# Patient Record
Sex: Male | Born: 1960 | Race: White | Hispanic: No | Marital: Single | Smoking: Never smoker
Health system: Southern US, Community
[De-identification: ages and names within clinical notes are randomized; demographics above are authoritative.]

## PROBLEM LIST (undated history)

## (undated) DIAGNOSIS — I219 Acute myocardial infarction, unspecified: Secondary | ICD-10-CM

## (undated) DIAGNOSIS — I2510 Atherosclerotic heart disease of native coronary artery without angina pectoris: Secondary | ICD-10-CM

## (undated) DIAGNOSIS — E7800 Pure hypercholesterolemia, unspecified: Secondary | ICD-10-CM

## (undated) DIAGNOSIS — I10 Essential (primary) hypertension: Secondary | ICD-10-CM

## (undated) DIAGNOSIS — K259 Gastric ulcer, unspecified as acute or chronic, without hemorrhage or perforation: Secondary | ICD-10-CM

## (undated) HISTORY — PX: CORONARY ANGIOPLASTY WITH STENT PLACEMENT: SHX49

## (undated) HISTORY — PX: TONSILLECTOMY: SUR1361

---

## 2017-05-03 ENCOUNTER — Other Ambulatory Visit: Payer: Self-pay

## 2017-05-03 ENCOUNTER — Encounter (HOSPITAL_BASED_OUTPATIENT_CLINIC_OR_DEPARTMENT_OTHER): Payer: Self-pay

## 2017-05-03 ENCOUNTER — Emergency Department (HOSPITAL_BASED_OUTPATIENT_CLINIC_OR_DEPARTMENT_OTHER)
Admission: EM | Admit: 2017-05-03 | Discharge: 2017-05-03 | Disposition: A | Discharge status: Home / Self Care | Payer: Self-pay | Attending: Emergency Medicine | Admitting: Emergency Medicine

## 2017-05-03 DIAGNOSIS — R112 Nausea with vomiting, unspecified: Secondary | ICD-10-CM | POA: Insufficient documentation

## 2017-05-03 DIAGNOSIS — K279 Peptic ulcer, site unspecified, unspecified as acute or chronic, without hemorrhage or perforation: Secondary | ICD-10-CM | POA: Insufficient documentation

## 2017-05-03 HISTORY — DX: Gastric ulcer, unspecified as acute or chronic, without hemorrhage or perforation: K25.9

## 2017-05-03 LAB — CBC WITH DIFFERENTIAL/PLATELET
BASOS ABS: 0 10*3/uL (ref 0.0–0.1)
Basophils Relative: 0 %
EOS ABS: 0 10*3/uL (ref 0.0–0.7)
EOS PCT: 0 %
HCT: 48.7 % (ref 39.0–52.0)
Hemoglobin: 16.2 g/dL (ref 13.0–17.0)
Lymphocytes Relative: 8 %
Lymphs Abs: 1.2 10*3/uL (ref 0.7–4.0)
MCH: 29.3 pg (ref 26.0–34.0)
MCHC: 33.3 g/dL (ref 30.0–36.0)
MCV: 88.1 fL (ref 78.0–100.0)
Monocytes Absolute: 0.5 10*3/uL (ref 0.1–1.0)
Monocytes Relative: 3 %
Neutro Abs: 13.6 10*3/uL — ABNORMAL HIGH (ref 1.7–7.7)
Neutrophils Relative %: 89 %
PLATELETS: 261 10*3/uL (ref 150–400)
RBC: 5.53 MIL/uL (ref 4.22–5.81)
RDW: 14.1 % (ref 11.5–15.5)
WBC: 15.2 10*3/uL — AB (ref 4.0–10.5)

## 2017-05-03 LAB — COMPREHENSIVE METABOLIC PANEL
ALBUMIN: 4.6 g/dL (ref 3.5–5.0)
ALT: 26 U/L (ref 17–63)
AST: 29 U/L (ref 15–41)
Alkaline Phosphatase: 77 U/L (ref 38–126)
Anion gap: 13 (ref 5–15)
BUN: 20 mg/dL (ref 6–20)
CHLORIDE: 102 mmol/L (ref 101–111)
CO2: 27 mmol/L (ref 22–32)
CREATININE: 0.79 mg/dL (ref 0.61–1.24)
Calcium: 9.3 mg/dL (ref 8.9–10.3)
GFR calc Af Amer: >60 mL/min (ref >60)
GFR calc non Af Amer: >60 mL/min (ref >60)
GLUCOSE: 164 mg/dL — AB (ref 65–99)
Potassium: 4.6 mmol/L (ref 3.5–5.1)
SODIUM: 142 mmol/L (ref 135–145)
Total Bilirubin: 0.8 mg/dL (ref 0.3–1.2)
Total Protein: 7.9 g/dL (ref 6.5–8.1)

## 2017-05-03 LAB — LIPASE, BLOOD: Lipase: 23 U/L (ref 11–51)

## 2017-05-03 MED ORDER — ESOMEPRAZOLE MAGNESIUM 40 MG PO CPDR: DELAYED_RELEASE_CAPSULE | ORAL | 0 refills | Status: DC

## 2017-05-03 MED ORDER — SUCRALFATE 1 G PO TABS: 1.0000 g | ORAL_TABLET | Freq: Three times a day (TID) | ORAL | 0 refills | Status: DC

## 2017-05-03 MED ORDER — SUCRALFATE 1 GM/10ML PO SUSP
ORAL | Status: AC
  Filled 2017-05-03: qty 10

## 2017-05-03 MED ORDER — SUCRALFATE 1 GM/10ML PO SUSP
1.0000 g | Freq: Three times a day (TID) | ORAL | Status: DC
  Administered 2017-05-03: 1 g via ORAL

## 2017-05-03 MED ORDER — ONDANSETRON HCL 4 MG/2ML IJ SOLN
INTRAMUSCULAR | Status: AC
  Filled 2017-05-03: qty 2

## 2017-05-03 MED ORDER — PANTOPRAZOLE SODIUM 40 MG IV SOLR
INTRAVENOUS | Status: AC
  Filled 2017-05-03: qty 40

## 2017-05-03 MED ORDER — PANTOPRAZOLE SODIUM 40 MG IV SOLR
40.0000 mg | Freq: Once | INTRAVENOUS | Status: AC
  Administered 2017-05-03: 40 mg via INTRAVENOUS

## 2017-05-03 MED ORDER — ONDANSETRON HCL 4 MG/2ML IJ SOLN
4.0000 mg | Freq: Once | INTRAMUSCULAR | Status: AC
  Administered 2017-05-03: 4 mg via INTRAVENOUS

## 2017-05-03 NOTE — ED Provider Notes (Signed)
[facility] Provider Note: Lowella Dell, MD, FACEP  CSN: [id number] MRN: [id number] ARRIVAL: 05/03/17 at 2043 ROOM: MH04/[facility]04   CHIEF COMPLAINT  Abdominal Pain   HISTORY OF PRESENT ILLNESS  05/03/17 11:35 PM Joel Hoffman is a 57 y.o. male who complains of sharp epigastric pain that began earlier today.  The onset has been gradual.  He has had several episodes of bilious vomiting.  His pain is less severe now than it was earlier.  He rated his pain as a 7 out of 10 on arrival.  He states his symptoms feel like those of a peptic ulcer 22 years ago.  Pain is not worse with movement or palpation.  He has chronic lower extremity edema, greater on the right than the left.  He does not currently have a physician.   Past Medical History:  Diagnosis Date  . Gastric ulcer     Past Surgical History:  Procedure Laterality Date  . TONSILLECTOMY      No family history on file.  Social History   Tobacco Use  . Smoking status: Never Smoker  . Smokeless tobacco: Never Used  Substance Use Topics  . Alcohol use: No    Frequency: Never  . Drug use: No    Prior to Admission medications   Not on File    Allergies Patient has no known allergies.   REVIEW OF SYSTEMS  Negative except as noted here or in the History of Present Illness.   PHYSICAL EXAMINATION  Initial Vital Signs Blood pressure (!) 181/111, pulse 94, temperature 98.2 F (36.8 C), temperature source Oral, resp. rate (!) 22, height 6' (1.829 m), weight 127 kg (279 lb 15.8 oz), SpO2 96 %.  Examination General: Well-developed, well-nourished male in no acute distress; appearance consistent with age of record HENT: normocephalic; atraumatic Eyes: pupils equal, round and reactive to light; extraocular muscles intact Neck: supple Heart: regular rate and rhythm Lungs: clear to auscultation bilaterally Abdomen: soft; nondistended; nontender; bowel sounds present Extremities: No deformity; full range of  motion; pulses normal; +1 pitting edema of lower legs Neurologic: Awake, alert and oriented; motor function intact in all extremities and symmetric; no facial droop Skin: Warm and dry Psychiatric: Normal mood and affect   RESULTS  Summary of this visit's results, reviewed by myself:   EKG Interpretation  Date/Time:    Ventricular Rate:    PR Interval:    QRS Duration:   QT Interval:    QTC Calculation:   R Axis:     Text Interpretation:        Laboratory Studies: Results for orders placed or performed during the hospital encounter of 05/03/17 (from the past 24 hour(s))  CBC with Differential     Status: Abnormal   Collection Time: 05/03/17 10:59 PM  Result Value Ref Range   WBC 15.2 (H) 4.0 - 10.5 K/uL   RBC 5.53 4.22 - 5.81 MIL/uL   Hemoglobin 16.2 13.0 - 17.0 g/dL   HCT 28.4 13.2 - 44.0 %   MCV 88.1 78.0 - 100.0 fL   MCH 29.3 26.0 - 34.0 pg   MCHC 33.3 30.0 - 36.0 g/dL   RDW 10.2 72.5 - 36.6 %   Platelets 261 150 - 400 K/uL   Neutrophils Relative % 89 %   Neutro Abs 13.6 (H) 1.7 - 7.7 K/uL   Lymphocytes Relative 8 %   Lymphs Abs 1.2 0.7 - 4.0 K/uL   Monocytes Relative 3 %   Monocytes Absolute 0.5  0.1 - 1.0 K/uL   Eosinophils Relative 0 %   Eosinophils Absolute 0.0 0.0 - 0.7 K/uL   Basophils Relative 0 %   Basophils Absolute 0.0 0.0 - 0.1 K/uL  Comprehensive metabolic panel     Status: Abnormal   Collection Time: 05/03/17 10:59 PM  Result Value Ref Range   Sodium 142 135 - 145 mmol/L   Potassium 4.6 3.5 - 5.1 mmol/L   Chloride 102 101 - 111 mmol/L   CO2 27 22 - 32 mmol/L   Glucose, Bld 164 (H) 65 - 99 mg/dL   BUN 20 6 - 20 mg/dL   Creatinine, Ser 4.130.79 0.61 - 1.24 mg/dL   Calcium 9.3 8.9 - 24.410.3 mg/dL   Total Protein 7.9 6.5 - 8.1 g/dL   Albumin 4.6 3.5 - 5.0 g/dL   AST 29 15 - 41 U/L   ALT 26 17 - 63 U/L   Alkaline Phosphatase 77 38 - 126 U/L   Total Bilirubin 0.8 0.3 - 1.2 mg/dL   GFR calc non Af Amer >60 >60 mL/min   GFR calc Af Amer >60 >60 mL/min    Anion gap 13 5 - 15  Lipase, blood     Status: None   Collection Time: 05/03/17 10:59 PM  Result Value Ref Range   Lipase 23 11 - 51 U/L   Imaging Studies: No results found.  ED COURSE  Nursing notes and initial vitals signs, including pulse oximetry, reviewed.  Vitals:   05/03/17 2103 05/03/17 2104 05/03/17 2313  BP: (!) 186/107  (!) 181/111  Pulse: 93  94  Resp: 20  (!) 22  Temp: 98.2 F (36.8 C)    TempSrc: Oral    SpO2: 98%  96%  Weight:  127 kg (279 lb 15.8 oz)   Height:  6' (1.829 m)    11:41 PM Significant relief with oral Carafate and IV Protonix.  We will start him on treatment for peptic ulcer disease and refer to gastroenterology.  PROCEDURES    ED DIAGNOSES     ICD-10-CM   1. Peptic ulcer disease K27.9        Amee Boothe, Jakevion, MD 05/03/17 2344

## 2017-05-03 NOTE — ED Triage Notes (Signed)
C/o upper abd pain, n/v x today-states feels same as when he had stomach ulcer-NAD-steady gait

## 2018-05-14 ENCOUNTER — Other Ambulatory Visit: Payer: Self-pay

## 2018-05-14 ENCOUNTER — Encounter (HOSPITAL_BASED_OUTPATIENT_CLINIC_OR_DEPARTMENT_OTHER): Payer: Self-pay | Admitting: Emergency Medicine

## 2018-05-14 ENCOUNTER — Emergency Department (HOSPITAL_BASED_OUTPATIENT_CLINIC_OR_DEPARTMENT_OTHER): Payer: Self-pay

## 2018-05-14 ENCOUNTER — Emergency Department (HOSPITAL_BASED_OUTPATIENT_CLINIC_OR_DEPARTMENT_OTHER)
Admission: EM | Admit: 2018-05-14 | Discharge: 2018-05-14 | Disposition: A | Discharge status: Home / Self Care | Payer: Self-pay | Attending: Emergency Medicine | Admitting: Emergency Medicine

## 2018-05-14 DIAGNOSIS — R0789 Other chest pain: Secondary | ICD-10-CM | POA: Insufficient documentation

## 2018-05-14 DIAGNOSIS — R079 Chest pain, unspecified: Secondary | ICD-10-CM

## 2018-05-14 DIAGNOSIS — R059 Cough, unspecified: Secondary | ICD-10-CM

## 2018-05-14 DIAGNOSIS — J189 Pneumonia, unspecified organism: Secondary | ICD-10-CM | POA: Insufficient documentation

## 2018-05-14 DIAGNOSIS — Z79899 Other long term (current) drug therapy: Secondary | ICD-10-CM | POA: Insufficient documentation

## 2018-05-14 DIAGNOSIS — R05 Cough: Secondary | ICD-10-CM

## 2018-05-14 MED ORDER — DOXYCYCLINE HYCLATE 100 MG PO CAPS: 100.0000 mg | ORAL_CAPSULE | Freq: Two times a day (BID) | ORAL | 0 refills | Status: AC

## 2018-05-14 MED ORDER — BENZONATATE 100 MG PO CAPS: 100.0000 mg | ORAL_CAPSULE | Freq: Three times a day (TID) | ORAL | 0 refills | Status: DC | PRN

## 2018-05-14 MED ORDER — DOXYCYCLINE HYCLATE 100 MG PO TABS
100.0000 mg | ORAL_TABLET | Freq: Once | ORAL | Status: AC
  Administered 2018-05-14: 100 mg via ORAL
  Filled 2018-05-14: qty 1

## 2018-05-14 NOTE — ED Notes (Signed)
Patient transported to X-ray 

## 2018-05-14 NOTE — ED Triage Notes (Signed)
Cough x 3 weeks. Also reports L rib pain when taking a deep breath.

## 2018-05-14 NOTE — ED Provider Notes (Signed)
[facility] EMERGENCY DEPARTMENT Provider Note   CSN: [id number] Arrival date & time: 05/14/18  0901     History   Chief Complaint Chief Complaint  Patient presents with  . Cough    HPI Joel Hoffman is a 58 y.o. male.  The history is provided by the patient and medical records. No language interpreter was used.  Cough  Cough characteristics:  Non-productive and productive Sputum characteristics:  Nondescript Severity:  Severe Onset quality:  Gradual Duration:  3 weeks Timing:  Constant Progression:  Waxing and waning Chronicity:  New Relieved by:  Nothing Worsened by:  Nothing Associated symptoms: chest pain, chills and fever   Associated symptoms: no diaphoresis, no headaches, no rash, no rhinorrhea, no shortness of breath, no sinus congestion and no wheezing     Past Medical History:  Diagnosis Date  . Gastric ulcer     There are no active problems to display for this patient.   Past Surgical History:  Procedure Laterality Date  . TONSILLECTOMY          Home Medications    Prior to Admission medications   Medication Sig Start Date End Date Taking? Authorizing Provider  esomeprazole (NEXIUM) 40 MG capsule Take 1 capsule every morning at least 30 minutes before first dose of Carafate. 05/03/17   Molpus, Shigeru, MD  sucralfate (CARAFATE) 1 g tablet Take 1 tablet (1 g total) by mouth 4 (four) times daily -  with meals and at bedtime. 05/03/17   Molpus, Ferron, MD    Family History No family history on file.  Social History Social History   Tobacco Use  . Smoking status: Never Smoker  . Smokeless tobacco: Never Used  Substance Use Topics  . Alcohol use: No    Frequency: Never  . Drug use: No     Allergies   Patient has no known allergies.   Review of Systems Review of Systems  Constitutional: Positive for chills and fever. Negative for diaphoresis and fatigue.  HENT: Negative for congestion and rhinorrhea.   Eyes: Negative for visual  disturbance.  Respiratory: Positive for cough. Negative for chest tightness, shortness of breath, wheezing and stridor.   Cardiovascular: Positive for chest pain. Negative for palpitations and leg swelling.  Gastrointestinal: Negative for constipation, diarrhea, nausea and vomiting.  Genitourinary: Negative for flank pain.  Musculoskeletal: Negative for back pain, neck pain and neck stiffness.  Skin: Negative for rash.  Neurological: Negative for light-headedness and headaches.  Psychiatric/Behavioral: Negative for agitation.  All other systems reviewed and are negative.    Physical Exam Updated Vital Signs BP (!) 176/105 (BP Location: Left Arm)   Pulse 91   Temp 98.4 F (36.9 C) (Oral)   Resp 20   Ht 6' (1.829 m)   Wt 124.7 kg   SpO2 99%   BMI 37.30 kg/m   Physical Exam Vitals signs and nursing note reviewed.  Constitutional:      General: He is not in acute distress.    Appearance: He is well-developed. He is not ill-appearing, toxic-appearing or diaphoretic.  HENT:     Head: Normocephalic and atraumatic.     Nose: No congestion or rhinorrhea.     Mouth/Throat:     Pharynx: No oropharyngeal exudate or posterior oropharyngeal erythema.  Eyes:     Conjunctiva/sclera: Conjunctivae normal.     Pupils: Pupils are equal, round, and reactive to light.  Neck:     Musculoskeletal: Neck supple. No neck rigidity or muscular tenderness.  Cardiovascular:     Rate and Rhythm: Normal rate and regular rhythm.     Heart sounds: No murmur.  Pulmonary:     Effort: Pulmonary effort is normal. No respiratory distress.     Breath sounds: Rhonchi present. No wheezing or rales.  Chest:     Chest wall: Tenderness present.  Abdominal:     Palpations: Abdomen is soft.     Tenderness: There is no abdominal tenderness. There is no guarding.  Musculoskeletal:        General: Tenderness present.  Skin:    General: Skin is warm and dry.     Capillary Refill: Capillary refill takes less than  2 seconds.  Neurological:     General: No focal deficit present.     Mental Status: He is alert and oriented to person, place, and time.     Gait: Gait normal.  Psychiatric:        Mood and Affect: Mood normal.      ED Treatments / Results  Labs (all labs ordered are listed, but only abnormal results are displayed) Labs Reviewed - No data to display  EKG EKG Interpretation  Date/Time:  Tuesday May 14 2018 10:03:24 EST Ventricular Rate:  79 PR Interval:    QRS Duration: 92 QT Interval:  366 QTC Calculation: 420 R Axis:   39 Text Interpretation:  Sinus rhythm Nonspecific T abnormalities, inferior leads Baseline wander in lead(s) V2 When compared to prior, new t wave inversions in leads 3 and AVF.  No STEMI Confirmed by Theda Belfast ([id number]) on 05/14/2018 10:09:42 AM   Radiology Dg Chest 2 View  Result Date: 05/14/2018 CLINICAL DATA:  Cough. EXAM: CHEST - 2 VIEW COMPARISON:  No recent prior. FINDINGS: Mediastinum hilar structures normal. Left base atelectasis and infiltrate consistent pneumonia. Mild right base infiltrate can not be excluded. Small left pleural effusion. Elevation left hemidiaphragm. No pneumothorax. No acute bony abnormality. IMPRESSION: 1. Prominent left base atelectasis and infiltrate consistent pneumonia. Small left pleural effusion. Elevation left hemidiaphragm. 2.  Mild right base infiltrate can not be excluded. Electronically Signed   By: Maisie Fus  Register   On: 05/14/2018 09:28    Procedures Procedures (including critical care time)  Medications Ordered in ED Medications  doxycycline (VIBRA-TABS) tablet 100 mg (100 mg Oral Given 05/14/18 1017)     Initial Impression / Assessment and Plan / ED Course  I have reviewed the triage vital signs and the nursing notes.  Pertinent labs & imaging results that were available during my care of the patient were reviewed by me and considered in my medical decision making (see chart for details).     Joel Hoffman is a 58 y.o. male with a past medical history significant for gastric ulcers and prior pneumonia years ago who presents with cough, subjective fevers, chills, and left-sided chest pain.  Patient reports that for the last 3 weeks he has been having this significant cough.  He reports this is intermittently productive.  He has not taken his temperature at home but is had shaking chills at times.  He reports no nausea, vomiting, diaphoresis, urinary symptoms or GI symptoms.  He reports the pain is in his left lateral chest and worsened with deep breathing or coughing.  He thinks it may be a pulled muscle in the setting of his coughing.  He has no history of DVT or PE.  He has no history of cardiac disease.  No recent trauma otherwise.  He describes the pain  is moderate to severe and only with breathing or coughing.  On exam, patient had rhonchi diffusely.  No wheezing or crackles.  No rash to suggest shingles on exam.  Left chest was slightly tender to palpation with movement.  Abdomen nontender, no CVA tenderness.  Legs had no significant edema.  Pulses symmetric and normal.  Clinical aspect patient has pneumonia given his history and exam findings and story.  Chest x-ray confirms pneumonia.  EKG was performed showing some new T wave inversions.  I informed the patient that given his left-sided chest pain, patients can still be having a cardiac injury or pulmonary embolism causing his symptoms and not just pneumonia however patient elected to pursue outpatient antibiotics and PCP follow-up as well as strict return precautions.  Patient was advised that symptoms could worsen however he still wants to go home with antibiotics.  Patient given a dose of doxycycline will be given a prescription for this.  He also be given a prescription for Tessalon given his significant coughing preventing sleeping.  Patient was able to ambulate around the emergency department without significant tachycardia or hypoxia,  I have low suspicion for PE however cannot definitively ruled out as we discussed.  Patient had no other questions or concerns and was able to ambulate without difficulty.  Patient understood return precautions and was discharged in good condition.   Final Clinical Impressions(s) / ED Diagnoses   Final diagnoses:  Cough  Left-sided chest pain  Community acquired pneumonia, unspecified laterality    ED Discharge Orders         Ordered    doxycycline (VIBRAMYCIN) 100 MG capsule  2 times daily     05/14/18 1013    benzonatate (TESSALON) 100 MG capsule  3 times daily PRN     05/14/18 1013         Clinical Impression: 1. Cough   2. Left-sided chest pain   3. Community acquired pneumonia, unspecified laterality     Disposition: Discharge  Condition: Good  I have discussed the results, Dx and Tx plan with the pt(& family if present). He/she/they expressed understanding and agree(s) with the plan. Discharge instructions discussed at great length. Strict return precautions discussed and pt &/or family have verbalized understanding of the instructions. No further questions at time of discharge.    New Prescriptions   BENZONATATE (TESSALON) 100 MG CAPSULE    Take 1 capsule (100 mg total) by mouth 3 (three) times daily as needed for cough.   DOXYCYCLINE (VIBRAMYCIN) 100 MG CAPSULE    Take 1 capsule (100 mg total) by mouth 2 (two) times daily for 7 days.    Follow Up: [facility] [address] [place] [address] [phone] Schedule an appointment as soon as possible for a visit    [facility] [place] [facility]Y DEPARTMENT [address] [id number] [address] [place] [address] [phone]       , Canary Brim, MD 05/14/18 1020

## 2018-05-14 NOTE — ED Notes (Signed)
ED Provider at bedside. 

## 2018-05-14 NOTE — Discharge Instructions (Signed)
Your x-ray today showed evidence of pneumonia likely contributing to your discomfort and symptoms.  Your EKG showed some abnormalities compared to prior and we discussed doing further lab testing and possible imaging however after our shared decision-making conversation, you elected to try outpatient treatment with antibiotics of your pneumonia initially.  Given your reassuring vital signs and oxygen saturations, we felt this was a reasonable decision.  Please follow-up with a primary physician and if any symptoms change or worsen including worsening shortness of breath or chest pain, please return immediately to the nearest emergency department for further work-up and likely further imaging to rule out pulmonary embolism.

## 2018-05-14 NOTE — ED Notes (Signed)
Pt ambulated around the dept, maintained O2 sats at 95%

## 2018-05-24 ENCOUNTER — Other Ambulatory Visit: Payer: Self-pay

## 2018-05-24 ENCOUNTER — Emergency Department (HOSPITAL_BASED_OUTPATIENT_CLINIC_OR_DEPARTMENT_OTHER)
Admission: EM | Admit: 2018-05-24 | Discharge: 2018-05-24 | Disposition: A | Discharge status: Home / Self Care | Payer: Self-pay | Attending: Emergency Medicine | Admitting: Emergency Medicine

## 2018-05-24 ENCOUNTER — Encounter (HOSPITAL_BASED_OUTPATIENT_CLINIC_OR_DEPARTMENT_OTHER): Payer: Self-pay | Admitting: Emergency Medicine

## 2018-05-24 DIAGNOSIS — Z79899 Other long term (current) drug therapy: Secondary | ICD-10-CM | POA: Insufficient documentation

## 2018-05-24 DIAGNOSIS — J189 Pneumonia, unspecified organism: Secondary | ICD-10-CM

## 2018-05-24 DIAGNOSIS — J181 Lobar pneumonia, unspecified organism: Secondary | ICD-10-CM | POA: Insufficient documentation

## 2018-05-24 NOTE — ED Notes (Signed)
See md notes

## 2018-05-24 NOTE — ED Triage Notes (Signed)
Reports to ER after having pneumonia and taking antibiotics.  States he was told by Dr. Rush Landmark to follow up in ER after finishing antibiotics.  States he feels much better.  Explained to patient that typically he should follow up with PCP for repeat chest xray after 1 month.  Patient voiced understanding.

## 2018-05-24 NOTE — Discharge Instructions (Signed)
Everyone needs a family doctor, if you do not have one please call the hotline number provided to try and establish with one.  As an alternative you could also see the primary care office its upstairs I have attached their number and information.  It is not uncommon to have some continued pain after having pneumonia.  Return for fever or worsening trouble breathing.

## 2018-05-24 NOTE — ED Provider Notes (Signed)
[facility] EMERGENCY DEPARTMENT Provider Note   CSN: [id number] Arrival date & time: 05/24/18  1039     History   Chief Complaint Chief Complaint  Patient presents with  . Follow-up    HPI Joel Hoffman is a 58 y.o. male.  58 yo M with a chief complaint of left-sided chest pain.  The patient had been seen here about 10 days ago where he was diagnosed with a left-sided pneumonia.  He had had cough and congestion for about 2 or 3 weeks prior to that and then started having fevers and worsening pain.  After the antibiotics the patient feels much better he has some mild pain upon deep inspiration.  Denies hemoptysis or unilateral lower extremity edema.  Denies shortness of breath.  The history is provided by the patient.  Illness  Severity:  Mild Onset quality:  Gradual Duration:  1 week Timing:  Constant Progression:  Partially resolved Chronicity:  New Associated symptoms: chest pain   Associated symptoms: no abdominal pain, no congestion, no diarrhea, no fever, no headaches, no myalgias, no rash, no shortness of breath and no vomiting     Past Medical History:  Diagnosis Date  . Gastric ulcer     There are no active problems to display for this patient.   Past Surgical History:  Procedure Laterality Date  . TONSILLECTOMY          Home Medications    Prior to Admission medications   Medication Sig Start Date End Date Taking? Authorizing Provider  benzonatate (TESSALON) 100 MG capsule Take 1 capsule (100 mg total) by mouth 3 (three) times daily as needed for cough. 05/14/18   Tegeler, Canary Brim, MD  esomeprazole (NEXIUM) 40 MG capsule Take 1 capsule every morning at least 30 minutes before first dose of Carafate. 05/03/17   Molpus, Hashem, MD  sucralfate (CARAFATE) 1 g tablet Take 1 tablet (1 g total) by mouth 4 (four) times daily -  with meals and at bedtime. 05/03/17   Molpus, Jonny Ruiz, MD    Family History History reviewed. No pertinent family  history.  Social History Social History   Tobacco Use  . Smoking status: Never Smoker  . Smokeless tobacco: Never Used  Substance Use Topics  . Alcohol use: No    Frequency: Never  . Drug use: No     Allergies   Patient has no known allergies.   Review of Systems Review of Systems  Constitutional: Negative for chills and fever.  HENT: Negative for congestion and facial swelling.   Eyes: Negative for discharge and visual disturbance.  Respiratory: Negative for shortness of breath.   Cardiovascular: Positive for chest pain. Negative for palpitations.  Gastrointestinal: Negative for abdominal pain, diarrhea and vomiting.  Musculoskeletal: Negative for arthralgias and myalgias.  Skin: Negative for color change and rash.  Neurological: Negative for tremors, syncope and headaches.  Psychiatric/Behavioral: Negative for confusion and dysphoric mood.     Physical Exam Updated Vital Signs BP (!) 166/109   Pulse 81   Temp 98.6 F (37 C) (Oral)   Resp 16   Ht 6' (1.829 m)   Wt 122.5 kg   SpO2 100%   BMI 36.62 kg/m   Physical Exam Vitals signs and nursing note reviewed.  Constitutional:      Appearance: He is well-developed.  HENT:     Head: Normocephalic and atraumatic.  Eyes:     Pupils: Pupils are equal, round, and reactive to light.  Neck:  Musculoskeletal: Normal range of motion and neck supple.     Vascular: No JVD.  Cardiovascular:     Rate and Rhythm: Normal rate and regular rhythm.     Heart sounds: No murmur. No friction rub. No gallop.   Pulmonary:     Effort: No respiratory distress.     Breath sounds: No wheezing.     Comments: No chest wall tenderness to palpation, no rash. Abdominal:     General: There is no distension.     Tenderness: There is no guarding or rebound.  Musculoskeletal: Normal range of motion.  Skin:    Coloration: Skin is not pale.     Findings: No rash.  Neurological:     Mental Status: He is alert and oriented to person,  place, and time.  Psychiatric:        Behavior: Behavior normal.      ED Treatments / Results  Labs (all labs ordered are listed, but only abnormal results are displayed) Labs Reviewed - No data to display  EKG None  Radiology No results found.  Procedures Procedures (including critical care time)  Medications Ordered in ED Medications - No data to display   Initial Impression / Assessment and Plan / ED Course  I have reviewed the triage vital signs and the nursing notes.  Pertinent labs & imaging results that were available during my care of the patient were reviewed by me and considered in my medical decision making (see chart for details).     58 yo M here for left-sided chest pain after treatment for pneumonia.  He actually states that he feels much better and said that he was told to return after the antibiotics were completed for repeat evaluation by the physician who had seen him previously.  He is well-appearing and nontoxic is clear lung sounds has some very mild left-sided chest pain with inspiration.  I expect that this is likely the typical course of disease.  I feel no further work-up is required for him in the ED.  We will have him follow-up with the family doctor.  11:09 AM:  I have discussed the diagnosis/risks/treatment options with the patient and believe the pt to be eligible for discharge home to follow-up with PCP. We also discussed returning to the ED immediately if new or worsening sx occur. We discussed the sx which are most concerning (e.g., sudden worsening pain,sob, fever, inability to tolerate by mouth) that necessitate immediate return. Medications administered to the patient during their visit and any new prescriptions provided to the patient are listed below.  Medications given during this visit Medications - No data to display   The patient appears reasonably screen and/or stabilized for discharge and I doubt any other medical condition or other  [facility] requiring further screening, evaluation, or treatment in the ED at this time prior to discharge.    Final Clinical Impressions(s) / ED Diagnoses   Final diagnoses:  Community acquired pneumonia of left lower lobe of lung [facility])    ED Discharge Orders    None       Melene Plan, DO 05/24/18 1109

## 2019-07-09 IMAGING — CR DG CHEST 2V
2 series · 2 of 2 positions shown · non-contrast
Comparison: No recent prior.

CLINICAL DATA: Cough.

EXAM:
CHEST - 2 VIEW

[w chest pa]
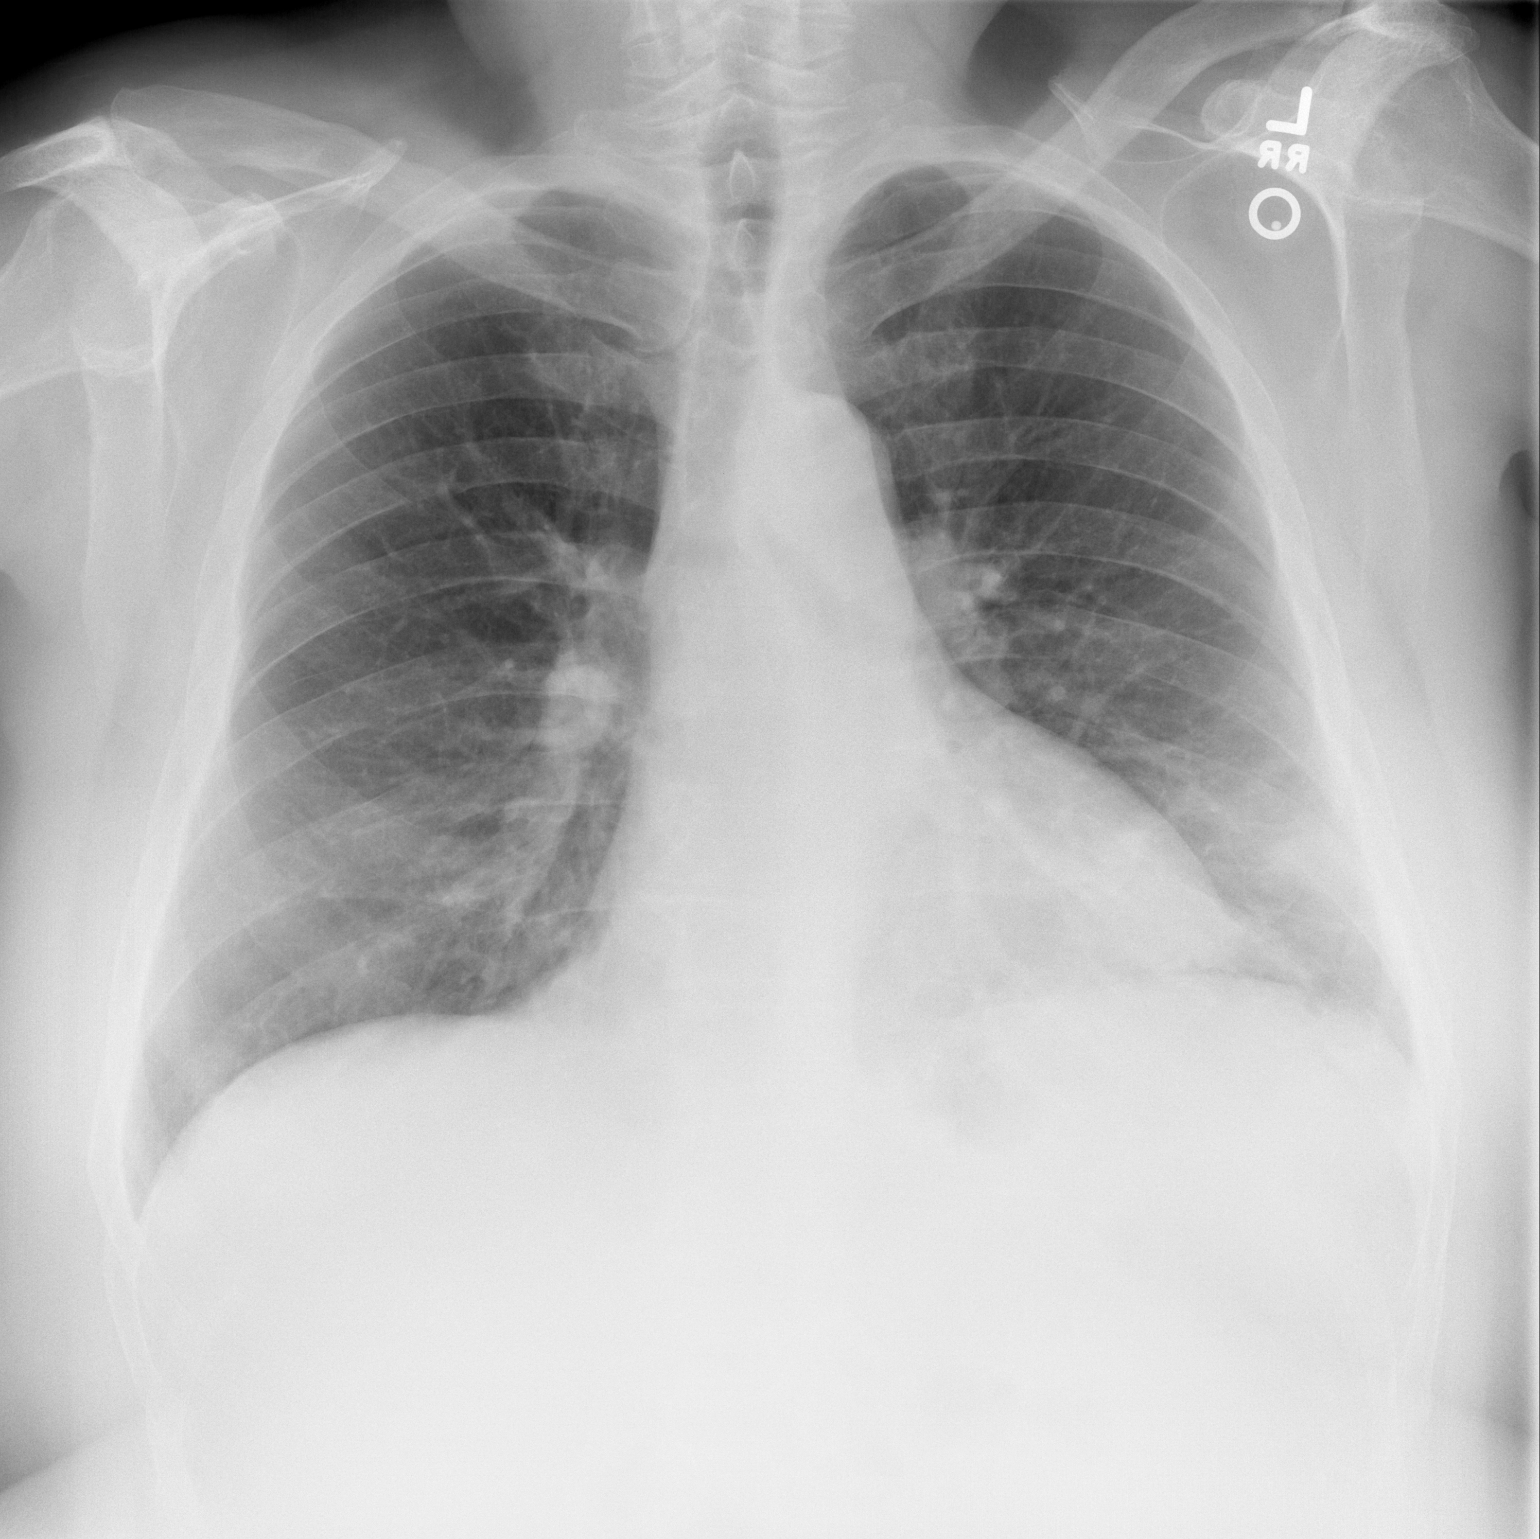

[w chest lat]
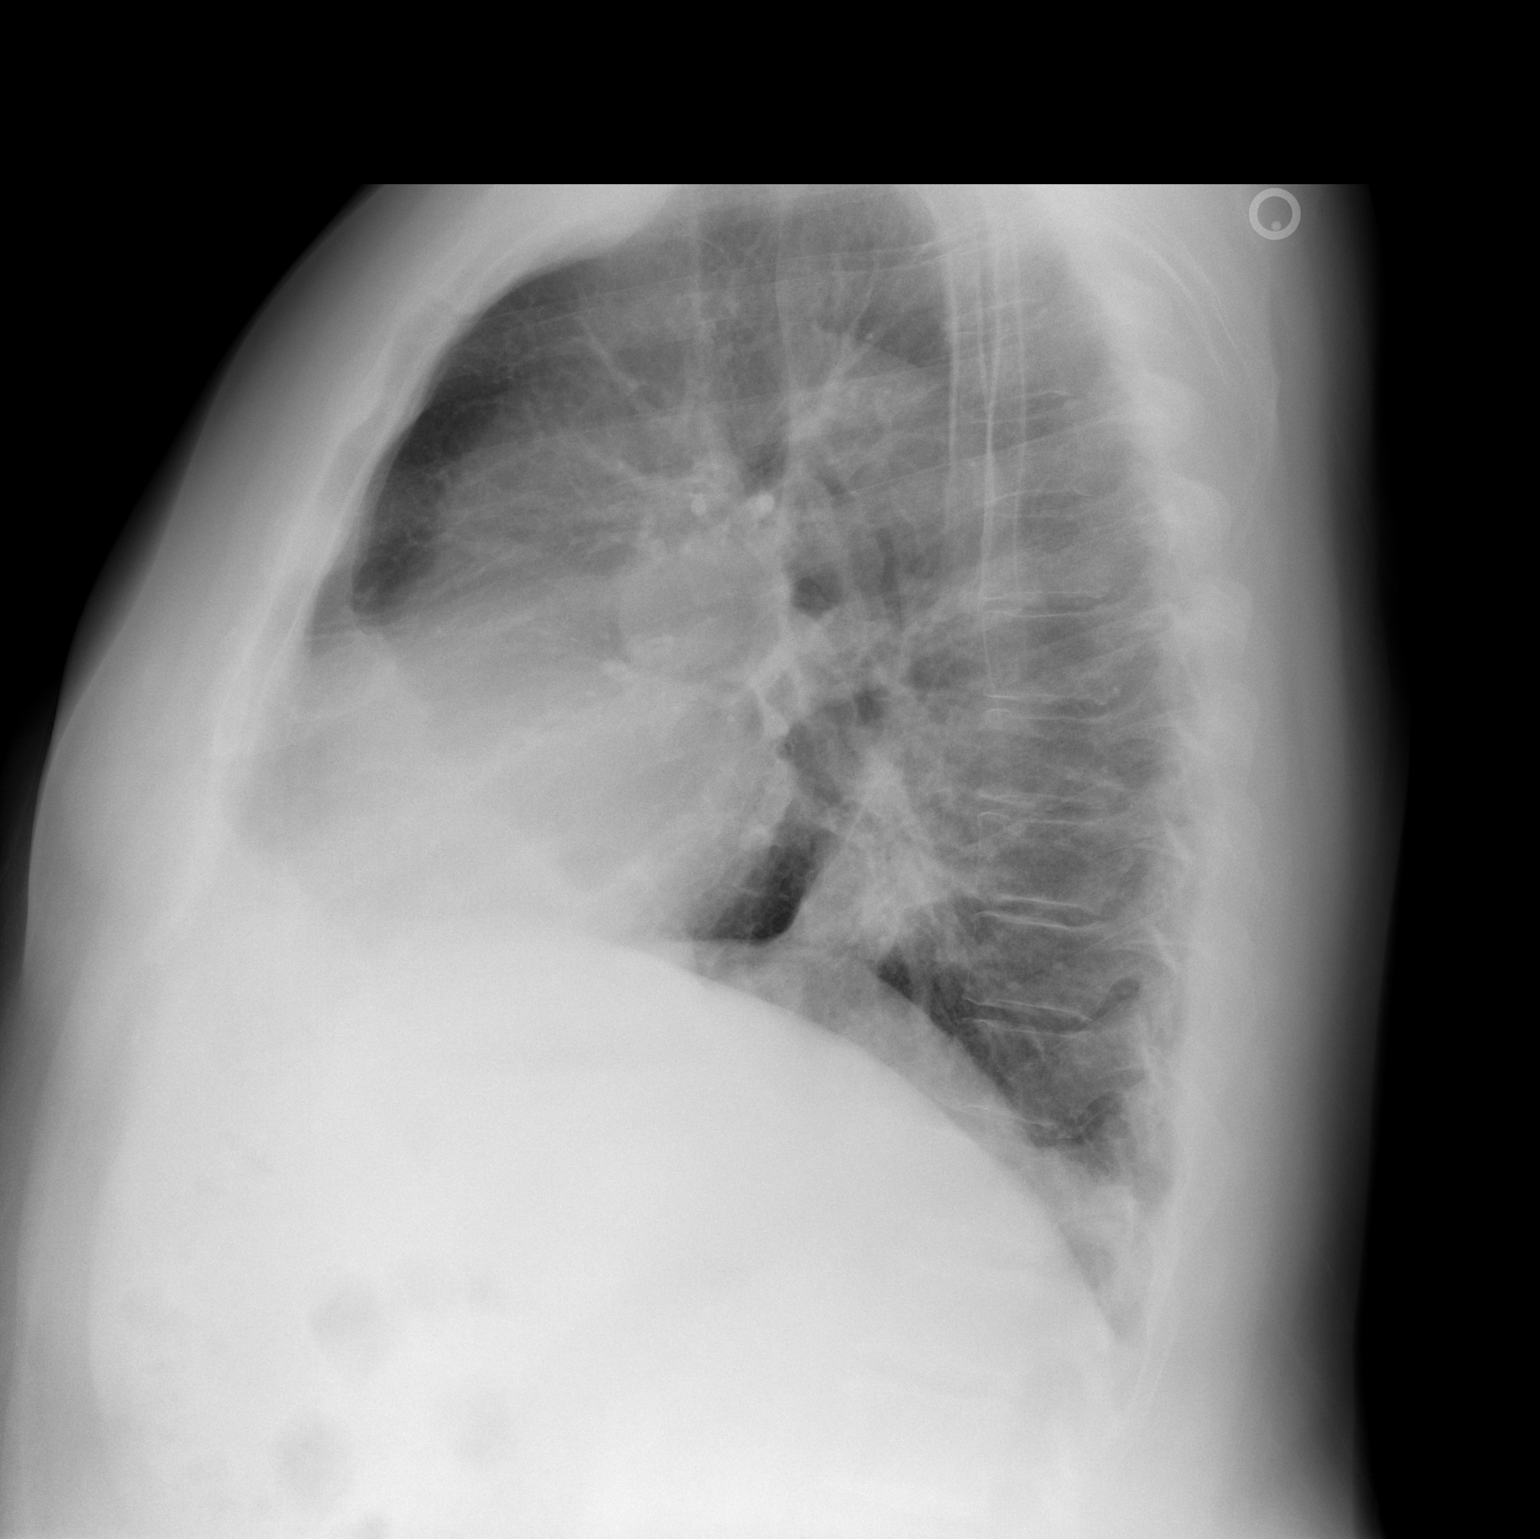

[2 of 2 positions shown; findings below may reference images not displayed]

FINDINGS: Mediastinum hilar structures normal. Left base atelectasis and
infiltrate consistent pneumonia. Mild right base infiltrate can not
be excluded. Small left pleural effusion. Elevation left
hemidiaphragm. No pneumothorax. No acute bony abnormality.
IMPRESSION: 1. Prominent left base atelectasis and infiltrate consistent
pneumonia. Small left pleural effusion. Elevation left
hemidiaphragm.

2.  Mild right base infiltrate can not be excluded.

## 2022-06-08 ENCOUNTER — Other Ambulatory Visit: Payer: Self-pay

## 2022-06-08 ENCOUNTER — Encounter (HOSPITAL_BASED_OUTPATIENT_CLINIC_OR_DEPARTMENT_OTHER): Payer: Self-pay | Admitting: Emergency Medicine

## 2022-06-08 ENCOUNTER — Emergency Department (HOSPITAL_BASED_OUTPATIENT_CLINIC_OR_DEPARTMENT_OTHER)
Admission: EM | Admit: 2022-06-08 | Discharge: 2022-06-08 | Disposition: A | Discharge status: Home / Self Care | Payer: TRICARE For Life (TFL) | Attending: Emergency Medicine | Admitting: Emergency Medicine

## 2022-06-08 DIAGNOSIS — Z7982 Long term (current) use of aspirin: Secondary | ICD-10-CM | POA: Insufficient documentation

## 2022-06-08 DIAGNOSIS — L03115 Cellulitis of right lower limb: Secondary | ICD-10-CM | POA: Insufficient documentation

## 2022-06-08 DIAGNOSIS — M7989 Other specified soft tissue disorders: Secondary | ICD-10-CM | POA: Diagnosis present

## 2022-06-08 HISTORY — DX: Pure hypercholesterolemia, unspecified: E78.00

## 2022-06-08 HISTORY — DX: Acute myocardial infarction, unspecified: I21.9

## 2022-06-08 HISTORY — DX: Atherosclerotic heart disease of native coronary artery without angina pectoris: I25.10

## 2022-06-08 HISTORY — DX: Essential (primary) hypertension: I10

## 2022-06-08 MED ORDER — DOXYCYCLINE HYCLATE 50 MG PO CAPS: 100.0000 mg | ORAL_CAPSULE | Freq: Two times a day (BID) | ORAL | 0 refills | Status: DC

## 2022-06-08 NOTE — Discharge Instructions (Addendum)
Take doxycycline twice a day with FOOD for 7 days  Watch for fever, chills, shortness of breath, worsening symptoms

## 2022-06-08 NOTE — ED Provider Notes (Signed)
[facility] Provider Note   CSN: [id number] Arrival date & time: 06/08/22  0911     History  Chief Complaint  Patient presents with   Leg Injury   Cellulitis    Joel Hoffman is a 62 y.o. male.  Patient p/f RLE injury and redness onset 2 weeks ago.  Patient reports that he scratched his right lower extremity on a table with a hard edge.  Since then, he has had swelling, redness.  He also hit his calf on the door at home, this caused additional bruising.  Denies systemic symptoms, no fever, chills, dyspnea, chest pain.  He is presenting today for he has not had improvement of his symptoms and has noticed worsening swelling and redness.  Denies history of diabetes, history of MI on baby aspirin.  Patient has a history of venous insufficiency bilaterally. No difficulty with walking or ADLs at present. No pain in the knee or ankle.           Home Medications Prior to Admission medications   Medication Sig Start Date End Date Taking? Authorizing Provider  atorvastatin (LIPITOR) 80 MG tablet TAKE ONE TABLET BY MOUTH ONE TIME DAILY AT 6 IN THE EVENING 06/05/22  Yes [provider]  carvedilol (COREG) 12.5 MG tablet Take 1 tablet by mouth 2 (two) times daily. 07/18/21  Yes [provider]  doxycycline (VIBRAMYCIN) 50 MG capsule Take 2 capsules (100 mg total) by mouth 2 (two) times daily for 7 days. 06/08/22 06/15/22 Yes Roshaun Pound, MD  lisinopril (ZESTRIL) 10 MG tablet Take 1 tablet by mouth daily. 05/30/22  Yes [provider]  aspirin EC 81 MG tablet Take by mouth.    [provider]      Allergies    Patient has no known allergies.    Review of Systems   Review of Systems  Constitutional:  Negative for chills and fever.  HENT:  Negative for ear pain and sore throat.   Eyes:  Negative for pain and visual disturbance.  Respiratory:  Negative for cough and shortness of breath.   Cardiovascular:   Negative for chest pain and palpitations.  Gastrointestinal:  Negative for abdominal pain and vomiting.  Genitourinary:  Negative for dysuria and hematuria.  Musculoskeletal:  Negative for back pain.       RLE pain and swelling   Skin:  Positive for wound (RLE). Negative for rash.  Neurological:  Negative for seizures and syncope.  All other systems reviewed and are negative.   Physical Exam Updated Vital Signs BP (!) 154/89 (BP Location: Left Arm)   Pulse 76   Temp 97.8 F (36.6 C) (Oral)   Resp 20   Ht 6' (1.829 m)   Wt 130.4 kg   SpO2 94%   BMI 38.98 kg/m  Physical Exam Vitals and nursing note reviewed.  Constitutional:      General: He is not in acute distress.    Appearance: He is well-developed.  HENT:     Head: Normocephalic and atraumatic.  Eyes:     Conjunctiva/sclera: Conjunctivae normal.  Cardiovascular:     Rate and Rhythm: Normal rate and regular rhythm.     Heart sounds: No murmur heard. Pulmonary:     Effort: Pulmonary effort is normal. No respiratory distress.     Breath sounds: Normal breath sounds.  Abdominal:     Palpations: Abdomen is soft.     Tenderness: There is no abdominal tenderness.  Musculoskeletal:  General: Swelling and tenderness present. Normal range of motion.     Cervical back: Neck supple.     Right lower leg: Edema present.     Comments: RLE swelling and erythema with healing wound, linear and superficial. See photo Full range of motion of knee and ankle Pitting edema bilaterally to the calf 2+ DP pulses palpable   Skin:    General: Skin is warm and dry.     Capillary Refill: Capillary refill takes less than 2 seconds.  Neurological:     Mental Status: He is alert.  Psychiatric:        Mood and Affect: Mood normal.        ED Results / Procedures / Treatments   Labs (all labs ordered are listed, but only abnormal results are displayed) Labs Reviewed - No data to display  EKG None  Radiology No results  found.  Procedures Procedures  None   Medications Ordered in ED Medications - No data to display  ED Course/ Medical Decision Making/ A&P                             Medical Decision Making Medical Decision Making:   Karlis Cregg is a 62 y.o. male who presented to the ED today with injury to the RLE detailed above.    Complete initial physical exam performed, notably the patient was significant swelling and redness of the right lower extremity after injury.    Reviewed and confirmed nursing documentation for past medical history, family history, social history.    Initial Assessment:   With the patient's presentation of right lower extremity injury, most likely diagnosis is cellulitis after injury. Other diagnoses were considered including (but not limited to) fracture, DVT, venous insufficiency, systemic disease. These are considered less likely due to history of present illness and physical exam findings.  No evidence of fracture on examination, patient has normal knee and ankle exam while neurovascularly intact.  No evidence of DVT, does have history of venous insufficiency bilaterally.  No systemic symptoms.  This is most consistent with an acute complicated illness  Initial Plan:  Objective evaluation as below reviewed  Will continue with doxycycline for treatment of cellulitis.  Reviewed antibiogram for appropriate antibiotics coverage, continuing with MRSA coverage and strep coverage.  No history of diabetes.   Final Assessment and Plan:   Will continue with treatment for cellulitis, discussed with the patient to continue with doxycycline twice a day with food.  Continue this for 7 days.  Discussed strict return precautions, follow-up with PCP if symptoms remain unchanged or worsen after abx.    Clinical Impression: Cellulitis of right lower extremity  (primary encounter diagnosis)     Risk Prescription drug management.          Final Clinical Impression(s) / ED  Diagnoses Final diagnoses:  Cellulitis of right lower extremity    Rx / DC Orders ED Discharge Orders          Ordered    doxycycline (VIBRAMYCIN) 50 MG capsule  2 times daily        06/08/22 1001              Erskine Emery, MD 06/08/22 [place], DO 06/08/22 1009

## 2022-06-08 NOTE — ED Triage Notes (Signed)
Pt injured right lower leg about 2 weeks ago.  Noted bruising, scratch mark,  redness, swelling and inflammation to right lower leg.  No known fever.  Pt is on ASA

## 2022-06-08 NOTE — ED Notes (Signed)
ED Provider at bedside. 

## 2022-06-13 ENCOUNTER — Emergency Department (HOSPITAL_BASED_OUTPATIENT_CLINIC_OR_DEPARTMENT_OTHER)

## 2022-06-13 ENCOUNTER — Inpatient Hospital Stay (HOSPITAL_BASED_OUTPATIENT_CLINIC_OR_DEPARTMENT_OTHER)
Admission: EM | Admit: 2022-06-13 | Discharge: 2022-06-16 | DRG: 813 | Disposition: A | Discharge status: Home / Self Care | Attending: Family Medicine | Admitting: Family Medicine

## 2022-06-13 ENCOUNTER — Other Ambulatory Visit: Payer: Self-pay

## 2022-06-13 DIAGNOSIS — N200 Calculus of kidney: Secondary | ICD-10-CM | POA: Diagnosis present

## 2022-06-13 DIAGNOSIS — Z833 Family history of diabetes mellitus: Secondary | ICD-10-CM | POA: Diagnosis not present

## 2022-06-13 DIAGNOSIS — I10 Essential (primary) hypertension: Secondary | ICD-10-CM | POA: Diagnosis not present

## 2022-06-13 DIAGNOSIS — L03115 Cellulitis of right lower limb: Secondary | ICD-10-CM | POA: Diagnosis not present

## 2022-06-13 DIAGNOSIS — I252 Old myocardial infarction: Secondary | ICD-10-CM | POA: Diagnosis not present

## 2022-06-13 DIAGNOSIS — E7800 Pure hypercholesterolemia, unspecified: Secondary | ICD-10-CM | POA: Insufficient documentation

## 2022-06-13 DIAGNOSIS — Z79899 Other long term (current) drug therapy: Secondary | ICD-10-CM | POA: Diagnosis not present

## 2022-06-13 DIAGNOSIS — Z955 Presence of coronary angioplasty implant and graft: Secondary | ICD-10-CM | POA: Diagnosis not present

## 2022-06-13 DIAGNOSIS — Z7982 Long term (current) use of aspirin: Secondary | ICD-10-CM

## 2022-06-13 DIAGNOSIS — Z880 Allergy status to penicillin: Secondary | ICD-10-CM

## 2022-06-13 DIAGNOSIS — Z8249 Family history of ischemic heart disease and other diseases of the circulatory system: Secondary | ICD-10-CM | POA: Diagnosis not present

## 2022-06-13 DIAGNOSIS — D696 Thrombocytopenia, unspecified: Secondary | ICD-10-CM | POA: Diagnosis present

## 2022-06-13 DIAGNOSIS — Z6838 Body mass index (BMI) 38.0-38.9, adult: Secondary | ICD-10-CM | POA: Diagnosis not present

## 2022-06-13 DIAGNOSIS — I2510 Atherosclerotic heart disease of native coronary artery without angina pectoris: Secondary | ICD-10-CM | POA: Diagnosis not present

## 2022-06-13 DIAGNOSIS — I700 Atherosclerosis of aorta: Secondary | ICD-10-CM | POA: Diagnosis not present

## 2022-06-13 DIAGNOSIS — D693 Immune thrombocytopenic purpura: Principal | ICD-10-CM

## 2022-06-13 DIAGNOSIS — Z825 Family history of asthma and other chronic lower respiratory diseases: Secondary | ICD-10-CM

## 2022-06-13 DIAGNOSIS — E669 Obesity, unspecified: Secondary | ICD-10-CM | POA: Diagnosis not present

## 2022-06-13 LAB — LACTIC ACID, PLASMA: Lactic Acid, Venous: 1.3 mmol/L (ref 0.5–1.9)

## 2022-06-13 LAB — CBC WITH DIFFERENTIAL/PLATELET
Abs Immature Granulocytes: 0.05 10*3/uL (ref 0.00–0.07)
Basophils Absolute: 0.1 10*3/uL (ref 0.0–0.1)
Basophils Relative: 1 %
Eosinophils Absolute: 0.2 10*3/uL (ref 0.0–0.5)
Eosinophils Relative: 2 %
HCT: 36.1 % — ABNORMAL LOW (ref 39.0–52.0)
Hemoglobin: 11.9 g/dL — ABNORMAL LOW (ref 13.0–17.0)
Immature Granulocytes: 1 %
Lymphocytes Relative: 22 %
Lymphs Abs: 1.8 10*3/uL (ref 0.7–4.0)
MCH: 29.8 pg (ref 26.0–34.0)
MCHC: 33 g/dL (ref 30.0–36.0)
MCV: 90.5 fL (ref 80.0–100.0)
Monocytes Absolute: 0.7 10*3/uL (ref 0.1–1.0)
Monocytes Relative: 9 %
Neutro Abs: 5.5 10*3/uL (ref 1.7–7.7)
Neutrophils Relative %: 65 %
Platelets: 5 10*3/uL — CL (ref 150–400)
RBC: 3.99 MIL/uL — ABNORMAL LOW (ref 4.22–5.81)
RDW: 13.6 % (ref 11.5–15.5)
WBC: 8.3 10*3/uL (ref 4.0–10.5)
nRBC: 0 % (ref 0.0–0.2)

## 2022-06-13 LAB — BASIC METABOLIC PANEL
Anion gap: 7 (ref 5–15)
BUN: 19 mg/dL (ref 8–23)
CO2: 24 mmol/L (ref 22–32)
Calcium: 8.4 mg/dL — ABNORMAL LOW (ref 8.9–10.3)
Chloride: 108 mmol/L (ref 98–111)
Creatinine, Ser: 1.09 mg/dL (ref 0.61–1.24)
GFR, Estimated: >60 mL/min (ref >60)
Glucose, Bld: 122 mg/dL — ABNORMAL HIGH (ref 70–99)
Potassium: 3.7 mmol/L (ref 3.5–5.1)
Sodium: 139 mmol/L (ref 135–145)

## 2022-06-13 NOTE — ED Notes (Signed)
Cheese and crackers given to pt

## 2022-06-13 NOTE — ED Provider Notes (Incomplete)
  [facility] Provider Note   CSN: [id number] Arrival date & time: 06/13/22  1653     History {Add pertinent medical, surgical, social history, OB history to HPI:1} Chief Complaint  Patient presents with   Leg Pain    Quinto Tippy is a 62 y.o. male.  HPI     Home Medications Prior to Admission medications   Medication Sig Start Date End Date Taking? Authorizing Provider  aspirin EC 81 MG tablet Take by mouth.    [provider]  atorvastatin (LIPITOR) 80 MG tablet TAKE ONE TABLET BY MOUTH ONE TIME DAILY AT 6 IN THE EVENING 06/05/22   [provider]  carvedilol (COREG) 12.5 MG tablet Take 1 tablet by mouth 2 (two) times daily. 07/18/21   [provider]  doxycycline (VIBRAMYCIN) 50 MG capsule Take 2 capsules (100 mg total) by mouth 2 (two) times daily for 7 days. 06/08/22 06/15/22  Erskine Emery, MD  lisinopril (ZESTRIL) 10 MG tablet Take 1 tablet by mouth daily. 05/30/22   [provider]      Allergies    Patient has no known allergies.    Review of Systems   Review of Systems  Physical Exam Updated Vital Signs BP (!) 159/81   Pulse 82   Temp 98.2 F (36.8 C) (Oral)   Resp 20   Ht 1.829 m (6')   Wt 130.2 kg   SpO2 98%   BMI 38.92 kg/m  Physical Exam  ED Results / Procedures / Treatments   Labs (all labs ordered are listed, but only abnormal results are displayed) Labs Reviewed  CBC WITH DIFFERENTIAL/PLATELET - Abnormal; Notable for the following components:      Result Value   RBC 3.99 (*)    Hemoglobin 11.9 (*)    HCT 36.1 (*)    Platelets 5 (*)    All other components within normal limits  BASIC METABOLIC PANEL - Abnormal; Notable for the following components:   Glucose, Bld 122 (*)    Calcium 8.4 (*)    All other components within normal limits  LACTIC ACID, PLASMA  LACTIC ACID, PLASMA    EKG None  Radiology No results found.  Procedures Procedures  {Document  cardiac monitor, telemetry assessment procedure when appropriate:1}  Medications Ordered in ED Medications - No data to display  ED Course/ Medical Decision Making/ A&P   {   Click here for ABCD2, HEART and other calculatorsREFRESH Note before signing :1}                          Medical Decision Making Amount and/or Complexity of Data Reviewed Labs: ordered.   ***  {Document critical care time when appropriate:1} {Document review of labs and clinical decision tools ie heart score, Chads2Vasc2 etc:1}  {Document your independent review of radiology images, and any outside records:1} {Document your discussion with family members, caretakers, and with consultants:1} {Document social determinants of health affecting pt's care:1} {Document your decision making why or why not admission, treatments were needed:1} Final Clinical Impression(s) / ED Diagnoses Final diagnoses:  None    Rx / DC Orders ED Discharge Orders     None

## 2022-06-13 NOTE — ED Triage Notes (Addendum)
Pt arrive with concerns about left leg pain and swelling. Pts left lower leg is red, swollen, and warm to the touch. Pt was treated with PO antibiotics, but swelling, redness,a dn pain have not gone away. Pt wanting to have area rechecked by MD. Pt denies fevers.

## 2022-06-14 ENCOUNTER — Emergency Department (HOSPITAL_BASED_OUTPATIENT_CLINIC_OR_DEPARTMENT_OTHER)

## 2022-06-14 ENCOUNTER — Encounter (HOSPITAL_COMMUNITY): Payer: Self-pay | Admitting: Family Medicine

## 2022-06-14 DIAGNOSIS — I2510 Atherosclerotic heart disease of native coronary artery without angina pectoris: Secondary | ICD-10-CM | POA: Diagnosis present

## 2022-06-14 DIAGNOSIS — Z833 Family history of diabetes mellitus: Secondary | ICD-10-CM | POA: Diagnosis not present

## 2022-06-14 DIAGNOSIS — D693 Immune thrombocytopenic purpura: Secondary | ICD-10-CM | POA: Diagnosis present

## 2022-06-14 DIAGNOSIS — I10 Essential (primary) hypertension: Secondary | ICD-10-CM | POA: Diagnosis present

## 2022-06-14 DIAGNOSIS — Z7982 Long term (current) use of aspirin: Secondary | ICD-10-CM | POA: Diagnosis not present

## 2022-06-14 DIAGNOSIS — D696 Thrombocytopenia, unspecified: Secondary | ICD-10-CM | POA: Diagnosis present

## 2022-06-14 DIAGNOSIS — N200 Calculus of kidney: Secondary | ICD-10-CM | POA: Diagnosis present

## 2022-06-14 DIAGNOSIS — Z6838 Body mass index (BMI) 38.0-38.9, adult: Secondary | ICD-10-CM | POA: Diagnosis not present

## 2022-06-14 DIAGNOSIS — I700 Atherosclerosis of aorta: Secondary | ICD-10-CM | POA: Diagnosis present

## 2022-06-14 DIAGNOSIS — E669 Obesity, unspecified: Secondary | ICD-10-CM | POA: Diagnosis present

## 2022-06-14 DIAGNOSIS — Z825 Family history of asthma and other chronic lower respiratory diseases: Secondary | ICD-10-CM | POA: Diagnosis not present

## 2022-06-14 DIAGNOSIS — Z955 Presence of coronary angioplasty implant and graft: Secondary | ICD-10-CM | POA: Diagnosis not present

## 2022-06-14 DIAGNOSIS — Z880 Allergy status to penicillin: Secondary | ICD-10-CM | POA: Diagnosis not present

## 2022-06-14 DIAGNOSIS — Z79899 Other long term (current) drug therapy: Secondary | ICD-10-CM | POA: Diagnosis not present

## 2022-06-14 DIAGNOSIS — Z8249 Family history of ischemic heart disease and other diseases of the circulatory system: Secondary | ICD-10-CM | POA: Diagnosis not present

## 2022-06-14 DIAGNOSIS — I252 Old myocardial infarction: Secondary | ICD-10-CM | POA: Diagnosis not present

## 2022-06-14 DIAGNOSIS — E7800 Pure hypercholesterolemia, unspecified: Secondary | ICD-10-CM | POA: Diagnosis present

## 2022-06-14 DIAGNOSIS — L03115 Cellulitis of right lower limb: Secondary | ICD-10-CM | POA: Diagnosis present

## 2022-06-14 LAB — LACTATE DEHYDROGENASE: LDH: 277 U/L — ABNORMAL HIGH (ref 98–192)

## 2022-06-14 LAB — CBC
HCT: 36.4 % — ABNORMAL LOW (ref 39.0–52.0)
Hemoglobin: 11.9 g/dL — ABNORMAL LOW (ref 13.0–17.0)
MCH: 30.7 pg (ref 26.0–34.0)
MCHC: 32.7 g/dL (ref 30.0–36.0)
MCV: 94.1 fL (ref 80.0–100.0)
Platelets: 13 10*3/uL — CL (ref 150–400)
RBC: 3.87 MIL/uL — ABNORMAL LOW (ref 4.22–5.81)
RDW: 13.6 % (ref 11.5–15.5)
WBC: 7.4 10*3/uL (ref 4.0–10.5)
nRBC: 0 % (ref 0.0–0.2)

## 2022-06-14 LAB — CBC WITH DIFFERENTIAL/PLATELET
Abs Immature Granulocytes: 0.03 10*3/uL (ref 0.00–0.07)
Abs Immature Granulocytes: 0.05 10*3/uL (ref 0.00–0.07)
Basophils Absolute: 0.1 10*3/uL (ref 0.0–0.1)
Basophils Absolute: 0.1 10*3/uL (ref 0.0–0.1)
Basophils Relative: 1 %
Basophils Relative: 1 %
Eosinophils Absolute: 0.2 10*3/uL (ref 0.0–0.5)
Eosinophils Absolute: 0.3 10*3/uL (ref 0.0–0.5)
Eosinophils Relative: 3 %
Eosinophils Relative: 3 %
HCT: 34.7 % — ABNORMAL LOW (ref 39.0–52.0)
HCT: 36 % — ABNORMAL LOW (ref 39.0–52.0)
Hemoglobin: 11.5 g/dL — ABNORMAL LOW (ref 13.0–17.0)
Hemoglobin: 11.7 g/dL — ABNORMAL LOW (ref 13.0–17.0)
Immature Granulocytes: 0 %
Immature Granulocytes: 1 %
Lymphocytes Relative: 25 %
Lymphocytes Relative: 23 %
Lymphs Abs: 2 10*3/uL (ref 0.7–4.0)
Lymphs Abs: 2 10*3/uL (ref 0.7–4.0)
MCH: 30.4 pg (ref 26.0–34.0)
MCH: 30.5 pg (ref 26.0–34.0)
MCHC: 33.1 g/dL (ref 30.0–36.0)
MCHC: 32.5 g/dL (ref 30.0–36.0)
MCV: 91.8 fL (ref 80.0–100.0)
MCV: 93.8 fL (ref 80.0–100.0)
Monocytes Absolute: 0.7 10*3/uL (ref 0.1–1.0)
Monocytes Absolute: 0.8 10*3/uL (ref 0.1–1.0)
Monocytes Relative: 8 %
Monocytes Relative: 9 %
Neutro Abs: 5.1 10*3/uL (ref 1.7–7.7)
Neutro Abs: 5.7 10*3/uL (ref 1.7–7.7)
Neutrophils Relative %: 63 %
Neutrophils Relative %: 63 %
Platelets: <5 10*3/uL — CL (ref 150–400)
Platelets: <5 10*3/uL — CL (ref 150–400)
RBC: 3.78 MIL/uL — ABNORMAL LOW (ref 4.22–5.81)
RBC: 3.84 MIL/uL — ABNORMAL LOW (ref 4.22–5.81)
RDW: 13.6 % (ref 11.5–15.5)
RDW: 13.6 % (ref 11.5–15.5)
Smear Review: NORMAL
WBC: 8.1 10*3/uL (ref 4.0–10.5)
WBC: 8.9 10*3/uL (ref 4.0–10.5)
nRBC: 0 % (ref 0.0–0.2)
nRBC: 0 % (ref 0.0–0.2)

## 2022-06-14 LAB — RAPID URINE DRUG SCREEN, HOSP PERFORMED
Amphetamines: NOT DETECTED
Barbiturates: NOT DETECTED
Benzodiazepines: NOT DETECTED
Cocaine: NOT DETECTED
Opiates: POSITIVE — AB
Tetrahydrocannabinol: NOT DETECTED

## 2022-06-14 LAB — BASIC METABOLIC PANEL
Anion gap: 8 (ref 5–15)
BUN: 21 mg/dL (ref 8–23)
CO2: 23 mmol/L (ref 22–32)
Calcium: 8.9 mg/dL (ref 8.9–10.3)
Chloride: 109 mmol/L (ref 98–111)
Creatinine, Ser: 0.85 mg/dL (ref 0.61–1.24)
GFR, Estimated: >60 mL/min (ref >60)
Glucose, Bld: 107 mg/dL — ABNORMAL HIGH (ref 70–99)
Potassium: 3.9 mmol/L (ref 3.5–5.1)
Sodium: 140 mmol/L (ref 135–145)

## 2022-06-14 LAB — IMMATURE PLATELET FRACTION: Immature Platelet Fraction: 37.2 % — ABNORMAL HIGH (ref 1.2–8.6)

## 2022-06-14 LAB — TYPE AND SCREEN
ABO/RH(D): A POS
Antibody Screen: NEGATIVE

## 2022-06-14 LAB — MONONUCLEOSIS SCREEN: Mono Screen: NEGATIVE

## 2022-06-14 LAB — HEPATITIS PANEL, ACUTE
HCV Ab: NONREACTIVE
Hep A IgM: NONREACTIVE
Hep B C IgM: NONREACTIVE
Hepatitis B Surface Ag: NONREACTIVE

## 2022-06-14 LAB — TSH: TSH: 1.532 u[IU]/mL (ref 0.350–4.500)

## 2022-06-14 LAB — MRSA NEXT GEN BY PCR, NASAL: MRSA by PCR Next Gen: NOT DETECTED

## 2022-06-14 LAB — FOLATE: Folate: 16.1 ng/mL (ref >5.9)

## 2022-06-14 LAB — ABO/RH: ABO/RH(D): A POS

## 2022-06-14 LAB — PROTIME-INR
INR: 1 (ref 0.8–1.2)
Prothrombin Time: 13.2 seconds (ref 11.4–15.2)

## 2022-06-14 LAB — HIV ANTIBODY (ROUTINE TESTING W REFLEX): HIV Screen 4th Generation wRfx: NONREACTIVE

## 2022-06-14 LAB — FIBRINOGEN: Fibrinogen: 625 mg/dL — ABNORMAL HIGH (ref 210–475)

## 2022-06-14 LAB — VITAMIN B12: Vitamin B-12: 684 pg/mL (ref 180–914)

## 2022-06-14 MED ORDER — MORPHINE SULFATE (PF) 2 MG/ML IV SOLN
1.0000 mg | INTRAVENOUS | Status: DC | PRN
  Administered 2022-06-14: 1 mg via INTRAVENOUS
  Filled 2022-06-14 (×2): qty 1

## 2022-06-14 MED ORDER — ACETAMINOPHEN 325 MG PO TABS
650.0000 mg | ORAL_TABLET | ORAL | Status: AC
  Administered 2022-06-14 – 2022-06-15 (×2): 650 mg via ORAL
  Filled 2022-06-14 (×2): qty 2

## 2022-06-14 MED ORDER — VANCOMYCIN HCL 2000 MG/400ML IV SOLN
2000.0000 mg | Freq: Once | INTRAVENOUS | Status: AC
  Administered 2022-06-14: 2000 mg via INTRAVENOUS
  Filled 2022-06-14: qty 400

## 2022-06-14 MED ORDER — METHYLPREDNISOLONE SODIUM SUCC 125 MG IJ SOLR
60.0000 mg | INTRAMUSCULAR | Status: AC
  Administered 2022-06-14 – 2022-06-15 (×2): 60 mg via INTRAVENOUS
  Filled 2022-06-14 (×3): qty 2

## 2022-06-14 MED ORDER — LISINOPRIL 10 MG PO TABS
10.0000 mg | ORAL_TABLET | Freq: Every day | ORAL | Status: DC
  Administered 2022-06-14 – 2022-06-16 (×3): 10 mg via ORAL
  Filled 2022-06-14 (×3): qty 1

## 2022-06-14 MED ORDER — METHYLPREDNISOLONE SODIUM SUCC 125 MG IJ SOLR: 60.0000 mg | Freq: Once | INTRAMUSCULAR | Status: DC

## 2022-06-14 MED ORDER — DIPHENHYDRAMINE HCL 50 MG/ML IJ SOLN
25.0000 mg | INTRAMUSCULAR | Status: AC
  Administered 2022-06-14 – 2022-06-15 (×2): 25 mg via INTRAVENOUS
  Filled 2022-06-14 (×2): qty 1

## 2022-06-14 MED ORDER — VANCOMYCIN HCL 1500 MG/300ML IV SOLN
1500.0000 mg | Freq: Two times a day (BID) | INTRAVENOUS | Status: DC
  Administered 2022-06-14 – 2022-06-16 (×4): 1500 mg via INTRAVENOUS
  Filled 2022-06-14 (×5): qty 300

## 2022-06-14 MED ORDER — INFLUENZA VAC SPLIT QUAD 0.5 ML IM SUSY: 0.5000 mL | PREFILLED_SYRINGE | INTRAMUSCULAR | Status: DC

## 2022-06-14 MED ORDER — CEFAZOLIN SODIUM-DEXTROSE 2-4 GM/100ML-% IV SOLN
2.0000 g | Freq: Once | INTRAVENOUS | Status: AC
  Administered 2022-06-14: 2 g via INTRAVENOUS
  Filled 2022-06-14: qty 100

## 2022-06-14 MED ORDER — LACTATED RINGERS IV SOLN: INTRAVENOUS | Status: AC

## 2022-06-14 MED ORDER — ACETAMINOPHEN 325 MG PO TABS
650.0000 mg | ORAL_TABLET | Freq: Once | ORAL | Status: AC
  Administered 2022-06-14: 650 mg via ORAL
  Filled 2022-06-14: qty 2

## 2022-06-14 MED ORDER — IMMUNE GLOBULIN (HUMAN) 10 GM/100ML IV SOLN
1.0000 g/kg | INTRAVENOUS | Status: AC
  Administered 2022-06-14 – 2022-06-15 (×2): 100 g via INTRAVENOUS
  Filled 2022-06-14 (×2): qty 1000

## 2022-06-14 MED ORDER — IOHEXOL 300 MG/ML  SOLN
125.0000 mL | Freq: Once | INTRAMUSCULAR | Status: AC | PRN
  Administered 2022-06-14: 125 mL via INTRAVENOUS

## 2022-06-14 MED ORDER — ACETAMINOPHEN 325 MG PO TABS: 650.0000 mg | ORAL_TABLET | Freq: Once | ORAL | Status: DC

## 2022-06-14 MED ORDER — SODIUM CHLORIDE 0.9 % IV SOLN
2.0000 g | Freq: Every day | INTRAVENOUS | Status: DC
  Administered 2022-06-14 – 2022-06-16 (×3): 2 g via INTRAVENOUS
  Filled 2022-06-14 (×2): qty 20

## 2022-06-14 MED ORDER — SODIUM CHLORIDE 0.9% IV SOLUTION
Freq: Once | INTRAVENOUS | Status: AC
  Filled 2022-06-14: qty 250

## 2022-06-14 MED ORDER — DEXAMETHASONE 4 MG PO TABS
20.0000 mg | ORAL_TABLET | Freq: Every day | ORAL | Status: DC
  Administered 2022-06-14 – 2022-06-16 (×3): 20 mg via ORAL
  Filled 2022-06-14 (×3): qty 5

## 2022-06-14 MED ORDER — CARVEDILOL 12.5 MG PO TABS
12.5000 mg | ORAL_TABLET | Freq: Two times a day (BID) | ORAL | Status: DC
  Administered 2022-06-14 – 2022-06-16 (×7): 12.5 mg via ORAL
  Filled 2022-06-14 (×7): qty 1

## 2022-06-14 MED ORDER — HYDROCODONE-ACETAMINOPHEN 5-325 MG PO TABS: 1.0000 | ORAL_TABLET | ORAL | Status: DC | PRN

## 2022-06-14 MED ORDER — DIPHENHYDRAMINE HCL 50 MG/ML IJ SOLN: 25.0000 mg | Freq: Once | INTRAMUSCULAR | Status: DC

## 2022-06-14 MED ORDER — ACETAMINOPHEN 325 MG PO TABS
650.0000 mg | ORAL_TABLET | Freq: Four times a day (QID) | ORAL | Status: DC | PRN
  Administered 2022-06-14: 650 mg via ORAL
  Filled 2022-06-14: qty 2

## 2022-06-14 NOTE — Progress Notes (Signed)
Pharmacy Antibiotic Note  Joel Hoffman is a 62 y.o. male admitted on 06/13/2022 with cellulitis.  Pharmacy has been consulted for vancomycin dosing.  Pt presents with lower extremity cellulitis. Pt was on doxycycline for 1 week with minimal improevemtn.   Plan: Vancomycin 2000 mg IV x1 then vancomycin 1500 mg IV q12h ( AUC 539, SCr 0.85, TBW 1302. Kg)  Ceftiaxone 2 gr IV q24h  Monitor clinical course, renal function, cultures as available   Height: 6' (182.9 cm) Weight: 130.2 kg (287 lb) IBW/kg (Calculated) : 77.6  Temp (24hrs), Avg:98.6 F (37 C), Min:98.2 F (36.8 C), Max:98.9 F (37.2 C)  Recent Labs  Lab 06/13/22 1705 06/14/22 0150 06/14/22 0429  WBC 8.3 8.1 8.9  CREATININE 1.09  --  0.85  LATICACIDVEN 1.3  --   --     Estimated Creatinine Clearance: 124.1 mL/min (by C-G formula based on SCr of 0.85 mg/dL).    No Known Allergies  Antimicrobials this admission:  2/14 ceftriaxone >>  2/14 vancomycin >>   Dose adjustments this admission:   Microbiology results: 2/14 BCx:  2/14 MRSA PCR:   Thank you for allowing pharmacy to be a part of this patient's care.   Royetta Asal, PharmD, BCPS 06/14/2022 6:49 AM

## 2022-06-14 NOTE — H&P (Addendum)
History and Physical  Joel Hoffman [id number] DOB: 1960/09/14 DOA: 06/13/2022  Referring physician: Accepted by Dr. Claria Dice [facility], hospitalist service. PCP: Alfonso Patten, MD  Outpatient Specialists: Cardiology. Patient coming from: Home through [facility] ED.  Chief Complaint: Right leg pain swelling and redness.  HPI: Joel Hoffman is a 62 y.o. male with medical history significant for coronary artery disease, hyperlipidemia, hypertension, obesity, who initially presented to [facility] ED with complaints of right lower extremity swelling, pain, warmth and redness.  Recently seen at [facility] ED, diagnosed and treated for right lower extremity cellulitis.   Prescribed po doxycycline on 06/08/2022, which he took for 5 days.  Completed course of antibiotic yesterday morning.  Due to minimal improvement of his symptomatology, he returned to [facility] ED for further evaluation.    In the ED, lab work revealed severe thrombocytopenia with platelet count of less than 5K.  No prior history of thrombocytopenia.    Endorses trauma to his right lower extremity on May 30, 2022 after bumping onto a glass made piece of furniture.  It cut the side of his R leg.  He had a repeated trauma to the same leg, 2 weeks later.  In the ER, physical exam was notable for significant right lower extremity erythema, edema, warmth and tenderness.  Also notable for right eye with scleral hemorrhage.  Right lower extremity ultrasound was negative for DVT.  Noncontrast CT head was non acute.  No hemorrhage.  CT abdomen and pelvis with contrast with no acute intra-abdominal process.  Nonobstructive right renal calculi.  Aortic atherosclerosis and coronary artery calcifications.    2 units platelets were ordered to be transfused during transfer from ED.  EDP requested admission.  The patient was admitted by Dr. Claria Dice, [facility], hospitalist service and transferred to [facility] progressive care unit as inpatient status.  ED Course:  Tmax 98.9.  BP 144/89, pulse 77, respiratory 18, O2 saturation 99% on room air.  Lab studies remarkable for BMP unremarkable.  LDH 277.  Hemoglobin 11.7, platelet count less than 5.  Review of Systems: Review of systems as noted in the HPI. All other systems reviewed and are negative.   Past Medical History:  Diagnosis Date   Coronary artery disease    Gastric ulcer    Heart attack ([place])    Hypercholesteremia    Hypertension    Past Surgical History:  Procedure Laterality Date   CORONARY ANGIOPLASTY WITH STENT PLACEMENT     TONSILLECTOMY      Social History:  reports that he has never smoked. He has never used smokeless tobacco. He reports that he does not drink alcohol and does not use drugs.   No Known Allergies  Family History  Problem Relation Age of Onset   Dementia Mother    Cancer Father    Diabetes Father    Emphysema Father    Colon cancer Father    Hypertension Brother       Prior to Admission medications   Medication Sig Start Date End Date Taking? Authorizing Provider  aspirin EC 81 MG tablet Take by mouth.    [provider]  atorvastatin (LIPITOR) 80 MG tablet TAKE ONE TABLET BY MOUTH ONE TIME DAILY AT 6 IN THE EVENING 06/05/22   [provider]  carvedilol (COREG) 12.5 MG tablet Take 1 tablet by mouth 2 (two) times daily. 07/18/21   [provider]  doxycycline (VIBRAMYCIN) 50 MG capsule Take 2 capsules (100 mg total) by mouth 2 (two) times  daily for 7 days. 06/08/22 06/15/22  Erskine Emery, MD  lisinopril (ZESTRIL) 10 MG tablet Take 1 tablet by mouth daily. 05/30/22   [provider]    Physical Exam: BP (!) 144/89 (BP Location: Left Arm)   Pulse 77   Temp 98.9 F (37.2 C) (Oral)   Resp 18   Ht 6' (1.829 m)   Wt 130.2 kg   SpO2 99%   BMI 38.92 kg/m   General: 62 y.o. year-old male well developed well nourished in no acute distress.  Alert and oriented x3. Cardiovascular: Regular rate and rhythm with no rubs or  gallops.  No thyromegaly or JVD noted.  No lower extremity edema. 2/4 pulses in all 4 extremities. Respiratory: Clear to auscultation with no wheezes or rales. Good inspiratory effort. Abdomen: Soft nontender nondistended with normal bowel sounds x4 quadrants. Muskuloskeletal: No cyanosis, clubbing or edema noted bilaterally Neuro: CN II-XII intact, strength, sensation, reflexes Skin: Right lower extremity edematous, erythematous, warm, and tender with palpation Psychiatry: Judgement and insight appear normal. Mood is appropriate for condition and setting          Labs on Admission:  Basic Metabolic Panel: Recent Labs  Lab 06/13/22 1705 06/14/22 0429  NA 139 140  K 3.7 3.9  CL 108 109  CO2 24 23  GLUCOSE 122* 107*  BUN 19 21  CREATININE 1.09 0.85  CALCIUM 8.4* 8.9   Liver Function Tests: No results for input(s): "AST", "ALT", "ALKPHOS", "BILITOT", "PROT", "ALBUMIN" in the last 168 hours. No results for input(s): "LIPASE", "AMYLASE" in the last 168 hours. No results for input(s): "AMMONIA" in the last 168 hours. CBC: Recent Labs  Lab 06/13/22 1705 06/14/22 0150 06/14/22 0429  WBC 8.3 8.1 8.9  NEUTROABS 5.5 5.1 5.7  HGB 11.9* 11.5* 11.7*  HCT 36.1* 34.7* 36.0*  MCV 90.5 91.8 93.8  PLT 5* <5* <5*   Cardiac Enzymes: No results for input(s): "CKTOTAL", "CKMB", "CKMBINDEX", "TROPONINI" in the last 168 hours.  BNP (last 3 results) No results for input(s): "BNP" in the last 8760 hours.  ProBNP (last 3 results) No results for input(s): "PROBNP" in the last 8760 hours.  CBG: No results for input(s): "GLUCAP" in the last 168 hours.  Radiological Exams on Admission: CT Head Wo Contrast  Result Date: 06/14/2022 CLINICAL DATA:  Polytrauma, thrombocytopenia EXAM: CT HEAD WITHOUT CONTRAST TECHNIQUE: Contiguous axial images were obtained from the base of the skull through the vertex without intravenous contrast. RADIATION DOSE REDUCTION: This exam was performed according to  the departmental dose-optimization program which includes automated exposure control, adjustment of the mA and/or kV according to patient size and/or use of iterative reconstruction technique. COMPARISON:  11/29/2018 FINDINGS: Brain: No evidence of acute infarction, hemorrhage, mass, mass effect, or midline shift. No hydrocephalus or extra-axial fluid collection. Vascular: Contrast is noted within the vasculature prior CT abdomen pelvis. Skull: Negative for fracture or focal lesion. Sinuses/Orbits: No acute finding. Other: The mastoid air cells are well aerated. IMPRESSION: No acute intracranial process. Electronically Signed   By: Merilyn Baba M.D.   On: 06/14/2022 02:39   CT ABDOMEN PELVIS W CONTRAST  Result Date: 06/14/2022 CLINICAL DATA:  Thrombocytopenia.  History of gastric ulcer. EXAM: CT ABDOMEN AND PELVIS WITH CONTRAST TECHNIQUE: Multidetector CT imaging of the abdomen and pelvis was performed using the standard protocol following bolus administration of intravenous contrast. RADIATION DOSE REDUCTION: This exam was performed according to the departmental dose-optimization program which includes automated exposure control, adjustment of the mA  and/or kV according to patient size and/or use of iterative reconstruction technique. CONTRAST:  121m OMNIPAQUE IOHEXOL 300 MG/ML  SOLN COMPARISON:  12/19/2018. FINDINGS: Lower chest: Coronary artery calcifications are noted. Hepatobiliary: There is a vague hypodensity in the left lobe of the liver measuring 2.3 x 1.2 cm, previously characterized as a hemangioma. No biliary ductal dilatation. Small stones are present in the gallbladder. Pancreas: Unremarkable. No pancreatic ductal dilatation or surrounding inflammatory changes. Spleen: Normal in size without focal abnormality. Adrenals/Urinary Tract: The adrenal glands are within normal limits. The kidneys enhance symmetrically. Renal calculi are present on the right. No hydroureteronephrosis. The bladder is  unremarkable. Stomach/Bowel: Stomach is within normal limits. Appendix appears normal. No evidence of bowel wall thickening, distention, or inflammatory changes. No free air or pneumatosis. Scattered diverticula are present along the colon without evidence of diverticulitis. Vascular/Lymphatic: Aortic atherosclerosis. No enlarged abdominal or pelvic lymph nodes. Reproductive: Prostate is unremarkable. Other: No abdominopelvic ascites. Musculoskeletal: Degenerative changes in the thoracolumbar spine. IMPRESSION: 1. No acute intra-abdominal process. 2. Nonobstructive right renal calculi. 3. Aortic atherosclerosis and coronary artery calcifications. Electronically Signed   By: LBrett FairyM.D.   On: 06/14/2022 02:18   UKoreaVenous Img Lower Unilateral Right (DVT)  Result Date: 06/13/2022 CLINICAL DATA:  Right calf pain and swelling. EXAM: RIGHT LOWER EXTREMITY VENOUS DOPPLER ULTRASOUND TECHNIQUE: Gray-scale sonography with graded compression, as well as color Doppler and duplex ultrasound were performed to evaluate the lower extremity deep venous systems from the level of the common femoral vein and including the common femoral, femoral, profunda femoral, popliteal and calf veins including the posterior tibial, peroneal and gastrocnemius veins when visible. The superficial great saphenous vein was also interrogated. Spectral Doppler was utilized to evaluate flow at rest and with distal augmentation maneuvers in the common femoral, femoral and popliteal veins. COMPARISON:  None Available. FINDINGS: Contralateral Common Femoral Vein: Respiratory phasicity is normal and symmetric with the symptomatic side. No evidence of thrombus. Normal compressibility. Common Femoral Vein: No evidence of thrombus. Normal compressibility, respiratory phasicity and response to augmentation. Saphenofemoral Junction: No evidence of thrombus. Normal compressibility and flow on color Doppler imaging. Profunda Femoral Vein: No evidence of  thrombus. Normal compressibility and flow on color Doppler imaging. Femoral Vein: No evidence of thrombus. Normal compressibility, respiratory phasicity and response to augmentation. Popliteal Vein: No evidence of thrombus. Normal compressibility, respiratory phasicity and response to augmentation. Calf Veins: The RIGHT posterior tibial vein and right peroneal vein are poorly visualized. Superficial Great Saphenous Vein: No evidence of thrombus. Normal compressibility. Venous Reflux:  None. Other Findings:  None. IMPRESSION: Limited visualization of the RIGHT posterior tibial vein and RIGHT peroneal vein, without evidence of DVT within the RIGHT lower extremity. Electronically Signed   By: TVirgina NorfolkM.D.   On: 06/13/2022 21:41    EKG: I independently viewed the EKG done and my findings are as followed: None available at the time of this dictation.  Assessment/Plan Present on Admission:  Thrombocytopenia (HBud  Principal Problem:   Thrombocytopenia (HCC)  Severe thrombocytopenia Presented with platelet count of less than 5. Right eye scleral hemorrhage 2 units platelets were [facility], no intracranial hemorrhage. CT abdominal/pelvis with contrast revealed no acute intra-abdominal process Workup for severe thrombocytopenia initiated in the ED Peripheral smear added Contact hematology in the morning for further workup  Right lower extremity cellulitis, failed outpatient antibiotic therapy, POA Was on p.o. doxycycline for 5 days  with minimal improvement Was started on Rocephin during transfer, continue Add IV vancomycin and gentle IV fluid hydration LR 50 cc/h x 2-days or while on IV vancomycin. Obtain MRSA screening test  Pain control and bowel regimen  Coronary artery disease Hold off home aspirin Denies any anginal symptoms at the time of this visit.  Hypertension Resume home oral  antihypertensive Closely monitor vital signs  Obesity BMI 38 Recommend weight loss outpatient with regular physical activity and healthy dieting.   DVT prophylaxis: Pharmacological DVT prophylaxis is contraindicated in the setting of severe thrombocytopenia.  Code Status: Full code.  Family Communication: None at bedside.  Disposition Plan: Admitted and transferred to progressive care unit  Consults called: Consult hematology in the morning  Admission status: Inpatient status.   Status is: Inpatient The patient requires at least 2 midnights for further evaluation and treatment of present condition.   Kayleen Memos MD [facility] Pager [phone]  If 7PM-7AM, please contact night-coverage [email or web address] Password [facility]  06/14/2022, 6:07 AM

## 2022-06-14 NOTE — ED Notes (Signed)
Report given to Rich with [facility]

## 2022-06-14 NOTE — Progress Notes (Signed)
PROGRESS NOTE    Joel Hoffman  [id number] DOB: 1960/10/21 DOA: 06/13/2022 PCP: Alfonso Patten, MD   Brief Narrative: Joel Hoffman is a 62 y.o. male with a history of CAD, hyperlipidemia, hypertension, obesity.  Patient presented secondary to right lower extremity pain, warmth, redness with evidence of nonresolving right lower leg cellulitis.  Patient also found to have severe thrombocytopenia.  Patient started on empiric IV antibiotics and hematology consulted for management of acute thrombocytopenia.   Assessment and Plan:  Severe thrombocytopenia Platelets of 5000 on admission.  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  Hematology was consulted on admission and have recommended steroids, IVIG, platelet transfusion to keep platelets greater than 10,000.  Right lower extremity cellulitis Present on admission.  Patient failed outpatient management while on doxycycline.  Blood cultures obtained on admission.  Patient started empirically on vancomycin and ceftriaxone.  Patient reports improvement of symptoms. -Continue vancomycin and cefepime IV -Follow-up blood culture results  CAD Hyperlipidemia Patient is on aspirin and Lipitor as an outpatient.  Aspirin held on admission secondary to severe thrombocytopenia. Resume home Lipitor  Primary hypertension Patient is on Coreg and lisinopril as an outpatient. -Continue lisinopril and Coreg  Obesity Estimated body mass index is 38.92 kg/m as calculated from the following:   Height as of this encounter: 6' (1.829 m).   Weight as of this encounter: 130.2 kg.  DVT prophylaxis: SCDs Code Status:   Code Status: Full Code Family Communication: None at bedside Disposition Plan: Discharge home   Consultants:  Hematology/oncology  Procedures:  None  Antimicrobials: Vancomycin IV Ceftriaxone IV    Subjective: Patient reports his right leg is feeling better regarding pain and is looking less  "angry."  Objective: BP (!) 171/95   Pulse 77   Temp 99 F (37.2 C) (Oral)   Resp 18   Ht 6' (1.829 m)   Wt 130.2 kg   SpO2 98%   BMI 38.92 kg/m   Examination:  General exam: Appears calm and comfortable Respiratory system: Clear to auscultation. Respiratory effort normal. Cardiovascular system: S1 & S2 heard, RRR. No murmurs, rubs, gallops or clicks. Gastrointestinal system: Abdomen is nondistended, soft and nontender. No organomegaly or masses felt. Normal bowel sounds heard. Central nervous system: Alert and oriented. No focal neurological deficits. Musculoskeletal: RLE edema. No calf tenderness Skin: No cyanosis.  Right lower extremity is significantly erythematous around anterior and lateral aspects of the leg tracking down over ankle dorsal aspect of right foot with no associated tenderness or warmth Psychiatry: Judgement and insight appear normal. Mood & affect appropriate.    Data Reviewed: I have personally reviewed following labs and imaging studies  CBC Lab Results  Component Value Date   WBC 8.9 06/14/2022   RBC 3.84 (L) 06/14/2022   HGB 11.7 (L) 06/14/2022   HCT 36.0 (L) 06/14/2022   MCV 93.8 06/14/2022   MCH 30.5 06/14/2022   PLT <5 (LL) 06/14/2022   MCHC 32.5 06/14/2022   RDW 13.6 06/14/2022   LYMPHSABS 2.0 06/14/2022   MONOABS 0.8 06/14/2022   EOSABS 0.3 06/14/2022   BASOSABS 0.1 [id number]     Last metabolic panel Lab Results  Component Value Date   NA 140 06/14/2022   K 3.9 06/14/2022   CL 109 06/14/2022   CO2 23 06/14/2022   BUN 21 06/14/2022   CREATININE 0.85 06/14/2022   GLUCOSE 107 (H) 06/14/2022   GFRNONAA >60 06/14/2022   GFRAA >60 05/03/2017   CALCIUM 8.9 06/14/2022  PROT 7.9 05/03/2017   ALBUMIN 4.6 05/03/2017   BILITOT 0.8 05/03/2017   ALKPHOS 77 05/03/2017   AST 29 05/03/2017   ALT 26 05/03/2017   ANIONGAP 8 06/14/2022    GFR: Estimated Creatinine Clearance: 124.1 mL/min (by C-G formula based on SCr of 0.85  mg/dL).  Recent Results (from the past 240 hour(s))  MRSA Next Gen by PCR, Nasal     Status: None   Collection Time: 06/14/22  6:26 AM   Specimen: Nasal Swab  Result Value Ref Range Status   MRSA by PCR Next Gen NOT DETECTED NOT DETECTED Final    Comment: (NOTE) The GeneXpert MRSA Assay (FDA approved for NASAL specimens only), is one component of a comprehensive MRSA colonization surveillance program. It is not intended to diagnose MRSA infection nor to guide or monitor treatment for MRSA infections. Test performance is not FDA approved in patients less than 35 years old. Performed at [facility], [place] [address] [place], [place] [address]       Radiology Studies: CT Head Wo Contrast  Result Date: 06/14/2022 CLINICAL DATA:  Polytrauma, thrombocytopenia EXAM: CT HEAD WITHOUT CONTRAST TECHNIQUE: Contiguous axial images were obtained from the base of the skull through the vertex without intravenous contrast. RADIATION DOSE REDUCTION: This exam was performed according to the departmental dose-optimization program which includes automated exposure control, adjustment of the mA and/or kV according to patient size and/or use of iterative reconstruction technique. COMPARISON:  11/29/2018 FINDINGS: Brain: No evidence of acute infarction, hemorrhage, mass, mass effect, or midline shift. No hydrocephalus or extra-axial fluid collection. Vascular: Contrast is noted within the vasculature prior CT abdomen pelvis. Skull: Negative for fracture or focal lesion. Sinuses/Orbits: No acute finding. Other: The mastoid air cells are well aerated. IMPRESSION: No acute intracranial process. Electronically Signed   By: Merilyn Baba M.D.   On: 06/14/2022 02:39   CT ABDOMEN PELVIS W CONTRAST  Result Date: 06/14/2022 CLINICAL DATA:  Thrombocytopenia.  History of gastric ulcer. EXAM: CT ABDOMEN AND PELVIS WITH CONTRAST TECHNIQUE: Multidetector CT imaging of the abdomen and pelvis was performed  using the standard protocol following bolus administration of intravenous contrast. RADIATION DOSE REDUCTION: This exam was performed according to the departmental dose-optimization program which includes automated exposure control, adjustment of the mA and/or kV according to patient size and/or use of iterative reconstruction technique. CONTRAST:  146m OMNIPAQUE IOHEXOL 300 MG/ML  SOLN COMPARISON:  12/19/2018. FINDINGS: Lower chest: Coronary artery calcifications are noted. Hepatobiliary: There is a vague hypodensity in the left lobe of the liver measuring 2.3 x 1.2 cm, previously characterized as a hemangioma. No biliary ductal dilatation. Small stones are present in the gallbladder. Pancreas: Unremarkable. No pancreatic ductal dilatation or surrounding inflammatory changes. Spleen: Normal in size without focal abnormality. Adrenals/Urinary Tract: The adrenal glands are within normal limits. The kidneys enhance symmetrically. Renal calculi are present on the right. No hydroureteronephrosis. The bladder is unremarkable. Stomach/Bowel: Stomach is within normal limits. Appendix appears normal. No evidence of bowel wall thickening, distention, or inflammatory changes. No free air or pneumatosis. Scattered diverticula are present along the colon without evidence of diverticulitis. Vascular/Lymphatic: Aortic atherosclerosis. No enlarged abdominal or pelvic lymph nodes. Reproductive: Prostate is unremarkable. Other: No abdominopelvic ascites. Musculoskeletal: Degenerative changes in the thoracolumbar spine. IMPRESSION: 1. No acute intra-abdominal process. 2. Nonobstructive right renal calculi. 3. Aortic atherosclerosis and coronary artery calcifications. Electronically Signed   By: LBrett FairyM.D.   On: 06/14/2022 02:18   UKoreaVenous Img Lower  Unilateral Right (DVT)  Result Date: 06/13/2022 CLINICAL DATA:  Right calf pain and swelling. EXAM: RIGHT LOWER EXTREMITY VENOUS DOPPLER ULTRASOUND TECHNIQUE: Gray-scale  sonography with graded compression, as well as color Doppler and duplex ultrasound were performed to evaluate the lower extremity deep venous systems from the level of the common femoral vein and including the common femoral, femoral, profunda femoral, popliteal and calf veins including the posterior tibial, peroneal and gastrocnemius veins when visible. The superficial great saphenous vein was also interrogated. Spectral Doppler was utilized to evaluate flow at rest and with distal augmentation maneuvers in the common femoral, femoral and popliteal veins. COMPARISON:  None Available. FINDINGS: Contralateral Common Femoral Vein: Respiratory phasicity is normal and symmetric with the symptomatic side. No evidence of thrombus. Normal compressibility. Common Femoral Vein: No evidence of thrombus. Normal compressibility, respiratory phasicity and response to augmentation. Saphenofemoral Junction: No evidence of thrombus. Normal compressibility and flow on color Doppler imaging. Profunda Femoral Vein: No evidence of thrombus. Normal compressibility and flow on color Doppler imaging. Femoral Vein: No evidence of thrombus. Normal compressibility, respiratory phasicity and response to augmentation. Popliteal Vein: No evidence of thrombus. Normal compressibility, respiratory phasicity and response to augmentation. Calf Veins: The RIGHT posterior tibial vein and right peroneal vein are poorly visualized. Superficial Great Saphenous Vein: No evidence of thrombus. Normal compressibility. Venous Reflux:  None. Other Findings:  None. IMPRESSION: Limited visualization of the RIGHT posterior tibial vein and RIGHT peroneal vein, without evidence of DVT within the RIGHT lower extremity. Electronically Signed   By: Virgina Norfolk M.D.   On: 06/13/2022 21:41      LOS: 0 days    Cordelia Poche, MD [facility] 06/14/2022, 1:09 PM   If 7PM-7AM, please contact night-coverage [email or web address]

## 2022-06-14 NOTE — ED Notes (Signed)
Patient transported to CT for head CT

## 2022-06-14 NOTE — Progress Notes (Addendum)
This is a 62 year old male with past medical history of CAD and hyperlipidemia.  Patient is maintained on aspirin, statin, Coreg, lisinopril, multivitamins and Tylenol.  Patient presented to the ER 06/08/2022 with complaints of RLE cellulitis/bruising covering most of the of the anterior surface.  His redness has been ongoing for approximately 2 weeks.  He was treated with doxycycline.  Patient returns to the ER as his cellulitis/bruising has not improved.  EDP noted hemorrhage in the right eye.  He has been seeing ophthalmology for approximately a month with no improvement.  When asked, patient endorses bleeding teeth and some blood in his stool.  Lab work done revealed platelets of 5, differential is normal.  MCV 90.5. Transfer requested.  Vitamin B12, folate, HIV, hep C, mono, EBV, INR, LDH, fibrinogen ordered.  Secure chat consult placed to Dr. Irene Limbo heme-onc.  Wide DDx including autoimmune, malignancy, infection [DIC, HIV hepatitis, mono, EBV...], Hypersplenism, HIT, nutritional... Doppler done, no DVT CT abdomen and pelvis pelvis, evaluate for hypersplenism Platelets transfusion ordered, patient at risk for spontaneous bleeding [patient with ocular bleeding] May not be cellulitis, maybe bruising d/t thrombocytopenia. Continue antibiotic until evaluated by hospitalist.

## 2022-06-14 NOTE — ED Notes (Signed)
Patient transported to X-ray 

## 2022-06-14 NOTE — ED Notes (Signed)
Report given to [facility] will be here in 5 min

## 2022-06-14 NOTE — Hospital Course (Addendum)
Joel Hoffman is a 62 y.o. male with a history of CAD, hyperlipidemia, hypertension, obesity.  Patient presented secondary to right lower extremity pain, warmth, redness with evidence of nonresolving right lower leg cellulitis.  Patient also found to have severe thrombocytopenia.  Patient started on empiric IV antibiotics and hematology consulted for management of acute thrombocytopenia. Patient's cellulitis improved with IV antibiotics. Platelets improved with IVIG x2, Solu-medrol > decadron and platelet transfusion. Patient transitioned to Cefadroxil on discharge per ID recommendations and patient to follow-up with hematology oncology as an outpatient.

## 2022-06-14 NOTE — Progress Notes (Addendum)
Paged [facility] Admits for new inpatient arrival to room 1439

## 2022-06-14 NOTE — Progress Notes (Addendum)
Marland Kitchen   HEMATOLOGY/ONCOLOGY CONSULTATION NOTE  Date of Service: 06/14/2022  Patient Care Team: Alfonso Patten, MD as PCP - General (Cardiology)  CHIEF COMPLAINTS/PURPOSE OF CONSULTATION:  Newly diagnosed severe thrombocytopenia  HISTORY OF PRESENTING ILLNESS:   Joel Hoffman is a wonderful 62 y.o. male who has been referred to [place] by Dr Riley Lam MD for evaluation and management of thrombocytopenia.  Patient has a history of hypertension, dyslipidemia, coronary disease status post MI with 2 stents placed more than 5 years ago, remote history of gastric ulcer.  Patient presented to the emergency room with right lower extremity pain and warmth redness and increasing discomfort.  He notes that he had had 2 traumas to the same area on his right leg after which he started having leg pain and swelling.  He was started on doxycycline as outpatient and noted that this failed to resolve his leg pain which was worsening and therefore he came to the emergency room.  In the emergency room the patient was noted to have severe right lower extremity cellulitis and was started on IV ceftriaxone and vancomycin. He was also noted on labs to have severe thrombocytopenia with platelets less than 5k Patient reports no previous history of thrombocytopenia or liver disease. He has had 2 or 3 subconjunctival hemorrhages in the last month. He does endorse using 600 mg of ibuprofen twice a day for the last 5 days for pain in the right leg. He notes that he was also on baby aspirin for his stents. No other evidence of overt bleeding.  No nosebleeds no gum bleeds no hematuria or GI bleeding at this time. No other recent viral infections coughs or colds.  No other recently started new medications other than the ibuprofen.  Denies use of any other herbal supplements.   MEDICAL HISTORY:  Past Medical History:  Diagnosis Date   Coronary artery disease    Gastric ulcer    Heart attack ([facility])     Hypercholesteremia    Hypertension     SURGICAL HISTORY: Past Surgical History:  Procedure Laterality Date   CORONARY ANGIOPLASTY WITH STENT PLACEMENT     TONSILLECTOMY      SOCIAL HISTORY: Social History   Socioeconomic History   Marital status: Single    Spouse name: Not on file   Number of children: Not on file   Years of education: Not on file   Highest education level: Not on file  Occupational History   Occupation: Programmer, systems: Building surveyor FOR SELF EMPLOYED    Comment: Retired from [employer] Use   Smoking status: Never   Smokeless tobacco: Never  Vaping Use   Vaping Use: Never used  Substance and Sexual Activity   Alcohol use: No    Comment: "Quit 24 years ago"   Drug use: No   Sexual activity: Not Currently  Other Topics Concern   Not on file  Social History Narrative   Not on file   Social Determinants of Health   Financial Resource Strain: Not on file  Food Insecurity: No Food Insecurity (06/14/2022)   Hunger Vital Sign    Worried About Running Out of Food in the Last Year: Never true    Ran Out of Food in the Last Year: Never true  Transportation Needs: No Transportation Needs (06/14/2022)   PRAPARE - Hydrologist (Medical): No    Lack of Transportation (Non-Medical): No  Physical Activity: Not on file  Stress: Not on file  Social Connections: Not on file  Intimate Partner Violence: Not At Risk (06/14/2022)   Humiliation, Afraid, Rape, and Kick questionnaire    Fear of Current or Ex-Partner: No    Emotionally Abused: No    Physically Abused: No    Sexually Abused: No    FAMILY HISTORY: Family History  Problem Relation Age of Onset   Dementia Mother    Cancer Father    Diabetes Father    Emphysema Father    Colon cancer Father    Hypertension Brother     ALLERGIES:  is allergic to ampicillin.  MEDICATIONS:  Current Facility-Administered Medications  Medication Dose Route Frequency Provider  Last Rate Last Admin   acetaminophen (TYLENOL) tablet 650 mg  650 mg Oral Q6H PRN Quintella Baton, MD   650 mg at 06/14/22 0958   carvedilol (COREG) tablet 12.5 mg  12.5 mg Oral BID WC Crosley, Debby, MD   12.5 mg at 06/14/22 0959   cefTRIAXone (ROCEPHIN) 2 g in sodium chloride 0.9 % 100 mL IVPB  2 g Intravenous Daily Crosley, Debby, MD 200 mL/hr at 06/14/22 1000 2 g at 06/14/22 1000   dexamethasone (DECADRON) tablet 20 mg  20 mg Oral Daily Brunetta Genera, MD   20 mg at 06/14/22 [id number]   HYDROcodone-acetaminophen (NORCO/VICODIN) 5-325 MG per tablet 1-2 tablet  1-2 tablet Oral Q4H PRN Mariel Aloe, MD       Immune Globulin 10% (PRIVIGEN) IV infusion 100 g  1 g/kg (Adjusted) Intravenous Q24 Hr x 2 Brunetta Genera, MD       Derrill Memo ON 06/15/2022] influenza vac split quadrivalent PF (FLUARIX) injection 0.5 mL  0.5 mL Intramuscular Tomorrow-1000 Crosley, Debby, MD       lactated ringers infusion   Intravenous Continuous Hall, Carole N, DO       lisinopril (ZESTRIL) tablet 10 mg  10 mg Oral Daily Crosley, Debby, MD   10 mg at 06/14/22 0959   vancomycin (VANCOREADY) IVPB 1500 mg/300 mL  1,500 mg Intravenous Q12H Mariel Aloe, MD        REVIEW OF SYSTEMS:    10 Point review of Systems was done is negative except as noted above.  PHYSICAL EXAMINATION: ECOG PERFORMANCE STATUS: 1 - Symptomatic but completely ambulatory  . Vitals:   06/14/22 1237 06/14/22 1450  BP: (!) 171/95 (!) 142/88  Pulse: 77   Resp: 18 18  Temp: 99 F (37.2 C) 98.4 F (36.9 C)  SpO2: 98% 100%   Filed Weights   06/13/22 1704  Weight: 287 lb (130.2 kg)   .Body mass index is 38.92 kg/m.  GENERAL:alert, in no acute distress and comfortable SKIN: no acute rashes, no significant lesions EYES: conjunctiva are pink and non-injected, sclera anicteric OROPHARYNX: MMM, no exudates, no oropharyngeal erythema or ulceration NECK: supple, no JVD LYMPH:  no palpable lymphadenopathy in the cervical, axillary or  inguinal regions LUNGS: clear to auscultation b/l with normal respiratory effort HEART: regular rate & rhythm ABDOMEN:  normoactive bowel sounds , non tender, not distended.  No palpable hepatosplenomegaly. Extremity: Severe right leg redness warmth and swelling.  His marked borders of cellulitis seem to be clearing up. PSYCH: alert & oriented x 3 with fluent speech NEURO: no focal motor/sensory deficits  LABORATORY DATA:  I have reviewed the data as listed  .    Latest Ref Rng & Units 06/14/2022    4:29 AM 06/14/2022    1:50 AM 06/13/2022  5:05 PM  CBC  WBC 4.0 - 10.5 K/uL 8.9  8.1  8.3   Hemoglobin 13.0 - 17.0 g/dL 11.7  11.5  11.9   Hematocrit 39.0 - 52.0 % 36.0  34.7  36.1   Platelets 150 - 400 K/uL <5  <5  5     .    Latest Ref Rng & Units 06/14/2022    4:29 AM 06/13/2022    5:05 PM 05/03/2017   10:59 PM  CMP  Glucose 70 - 99 mg/dL 107  122  164   BUN 8 - 23 mg/dL 21  19  20   $ Creatinine 0.61 - 1.24 mg/dL 0.85  1.09  0.79   Sodium 135 - 145 mmol/L 140  139  142   Potassium 3.5 - 5.1 mmol/L 3.9  3.7  4.6   Chloride 98 - 111 mmol/L 109  108  102   CO2 22 - 32 mmol/L 23  24  27   $ Calcium 8.9 - 10.3 mg/dL 8.9  8.4  9.3   Total Protein 6.5 - 8.1 g/dL   7.9   Total Bilirubin 0.3 - 1.2 mg/dL   0.8   Alkaline Phos 38 - 126 U/L   77   AST 15 - 41 U/L   29   ALT 17 - 63 U/L   26      RADIOGRAPHIC STUDIES: I have personally reviewed the radiological images as listed and agreed with the findings in the report. CT Head Wo Contrast  Result Date: 06/14/2022 CLINICAL DATA:  Polytrauma, thrombocytopenia EXAM: CT HEAD WITHOUT CONTRAST TECHNIQUE: Contiguous axial images were obtained from the base of the skull through the vertex without intravenous contrast. RADIATION DOSE REDUCTION: This exam was performed according to the departmental dose-optimization program which includes automated exposure control, adjustment of the mA and/or kV according to patient size and/or use of iterative  reconstruction technique. COMPARISON:  11/29/2018 FINDINGS: Brain: No evidence of acute infarction, hemorrhage, mass, mass effect, or midline shift. No hydrocephalus or extra-axial fluid collection. Vascular: Contrast is noted within the vasculature prior CT abdomen pelvis. Skull: Negative for fracture or focal lesion. Sinuses/Orbits: No acute finding. Other: The mastoid air cells are well aerated. IMPRESSION: No acute intracranial process. Electronically Signed   By: Merilyn Baba M.D.   On: 06/14/2022 02:39   CT ABDOMEN PELVIS W CONTRAST  Result Date: 06/14/2022 CLINICAL DATA:  Thrombocytopenia.  History of gastric ulcer. EXAM: CT ABDOMEN AND PELVIS WITH CONTRAST TECHNIQUE: Multidetector CT imaging of the abdomen and pelvis was performed using the standard protocol following bolus administration of intravenous contrast. RADIATION DOSE REDUCTION: This exam was performed according to the departmental dose-optimization program which includes automated exposure control, adjustment of the mA and/or kV according to patient size and/or use of iterative reconstruction technique. CONTRAST:  133m OMNIPAQUE IOHEXOL 300 MG/ML  SOLN COMPARISON:  12/19/2018. FINDINGS: Lower chest: Coronary artery calcifications are noted. Hepatobiliary: There is a vague hypodensity in the left lobe of the liver measuring 2.3 x 1.2 cm, previously characterized as a hemangioma. No biliary ductal dilatation. Small stones are present in the gallbladder. Pancreas: Unremarkable. No pancreatic ductal dilatation or surrounding inflammatory changes. Spleen: Normal in size without focal abnormality. Adrenals/Urinary Tract: The adrenal glands are within normal limits. The kidneys enhance symmetrically. Renal calculi are present on the right. No hydroureteronephrosis. The bladder is unremarkable. Stomach/Bowel: Stomach is within normal limits. Appendix appears normal. No evidence of bowel wall thickening, distention, or inflammatory changes. No free  air  or pneumatosis. Scattered diverticula are present along the colon without evidence of diverticulitis. Vascular/Lymphatic: Aortic atherosclerosis. No enlarged abdominal or pelvic lymph nodes. Reproductive: Prostate is unremarkable. Other: No abdominopelvic ascites. Musculoskeletal: Degenerative changes in the thoracolumbar spine. IMPRESSION: 1. No acute intra-abdominal process. 2. Nonobstructive right renal calculi. 3. Aortic atherosclerosis and coronary artery calcifications. Electronically Signed   By: Brett Fairy M.D.   On: 06/14/2022 02:18   US Venous Img Lower Unilateral Right (DVT)  Result Date: 06/13/2022 CLINICAL DATA:  Right calf pain and swelling. EXAM: RIGHT LOWER EXTREMITY VENOUS DOPPLER ULTRASOUND TECHNIQUE: Gray-scale sonography with graded compression, as well as color Doppler and duplex ultrasound were performed to evaluate the lower extremity deep venous systems from the level of the common femoral vein and including the common femoral, femoral, profunda femoral, popliteal and calf veins including the posterior tibial, peroneal and gastrocnemius veins when visible. The superficial great saphenous vein was also interrogated. Spectral Doppler was utilized to evaluate flow at rest and with distal augmentation maneuvers in the common femoral, femoral and popliteal veins. COMPARISON:  None Available. FINDINGS: Contralateral Common Femoral Vein: Respiratory phasicity is normal and symmetric with the symptomatic side. No evidence of thrombus. Normal compressibility. Common Femoral Vein: No evidence of thrombus. Normal compressibility, respiratory phasicity and response to augmentation. Saphenofemoral Junction: No evidence of thrombus. Normal compressibility and flow on color Doppler imaging. Profunda Femoral Vein: No evidence of thrombus. Normal compressibility and flow on color Doppler imaging. Femoral Vein: No evidence of thrombus. Normal compressibility, respiratory phasicity and response to  augmentation. Popliteal Vein: No evidence of thrombus. Normal compressibility, respiratory phasicity and response to augmentation. Calf Veins: The RIGHT posterior tibial vein and right peroneal vein are poorly visualized. Superficial Great Saphenous Vein: No evidence of thrombus. Normal compressibility. Venous Reflux:  None. Other Findings:  None. IMPRESSION: Limited visualization of the RIGHT posterior tibial vein and RIGHT peroneal vein, without evidence of DVT within the RIGHT lower extremity. Electronically Signed   By: Virgina Norfolk M.D.   On: 06/13/2022 21:41    ASSESSMENT & PLAN:   62 year old male with  #1 severe newly noted thrombocytopenia with platelets of less than 5k. 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 Immature platelet fraction of 37.2% suggesting excellent bone marrow response. Use of high doses of ibuprofen over the last 5 days could also be a concern. Normal TSH B12 and folate levels Monospot test negative All of the patients questions were answered with apparent satisfaction. The patient knows to call the clinic with any problems, questions or concerns. CT abdomen pelvis done today shows no hepatosplenomegaly or signs of chronic liver disease.  #2 right lower extremity cellulitis following injury to the right leg with skin breach.-On ceftriaxone and vancomycin per hospitalist.  #3 hypertension #4 coronary disease status post remote history of PCI x 2 #5 obesity  Plan -I reviewed with the patient detailed HPI. -We discussed his available labs and imaging studies in details. -We discussed that given his elevated immature platelet fraction recent cellulitis and significant NSAID use it is likely that his severely low platelets are due to acute immune thrombocytopenia triggered either by his infection or NSAIDs.  He denies any drug use and notes no previous history of liver disease or  thrombocytopenia. -His baby aspirin has been held -Absolutely no NSAIDs -Prophylactic platelet transfusions to maintain platelet counts of more than 10k.  Please draw platelet count 30 to 45 minutes after any platelet transfusions  to monitor for platelet recovery/response. -Dexamethasone 20 mg daily for 4 days -IVIG 1 g/kg every 24 hours for 2 doses with Tylenol 650, Benadryl 25 and Solu-Medrol 60 mg as premedications.  This was recommended early this morning but orders were not placed per my recommendations and I have placed these in the afternoon after noting that these were not placed.  Patient's consent was obtained for IVIG and other interventions. -Patient had several appropriate questions which were answered in details. -Continue to monitor blood counts at least twice daily. -Patient counseled to report any concerns with abnormal bleeding issues. -Avoid medications that could worsen thrombocytopenia.  .The total time spent in the appointment was 81 minutes* .  All of the patient's questions were answered with apparent satisfaction. The patient knows to call the clinic with any problems, questions or concerns.   Sullivan Lone MD MS AAHIVMS [facility] Hematology/Oncology Physician [facility]  .*Total Encounter Time as defined by the Centers for Medicare and Medicaid Services includes, in addition to the face-to-face time of a patient visit (documented in the note above) non-face-to-face time: obtaining and reviewing outside history, ordering and reviewing medications, tests or procedures, care coordination (communications with other health care professionals or caregivers) and documentation in the medical record.  06/14/2022 4:07 PM     Hematology short note  Received secure chat regarding  consult for this patient Isolated thrombocytopenia in the context of cellulitis.  -hold NSAIDS including ASA -Dexamethasone 13m daily x 4 doses IVIG 1g/kg daily x 2 doses with  tylenol 6575mand benadryl 2537ms premeds -transfuse platelets prophylactically for plt count<10k and rpt plt count 30-94m18mafter each platelet transfusion to evaluate platelet recovery. -immature platelet fraction -avoid meds that could worsening thrombocytopenia.  GautSullivan Lone

## 2022-06-14 NOTE — Progress Notes (Addendum)
Blood product consent obtained from patient at this time. Placed in paper lite chart.  0430- urine spec cup in room; patient instructed to provide urine specimen for lab collection  0445- Notified by blood bank of need for type & screen redraw. Phlebotomy called for redraw.

## 2022-06-15 DIAGNOSIS — D693 Immune thrombocytopenic purpura: Secondary | ICD-10-CM | POA: Diagnosis not present

## 2022-06-15 DIAGNOSIS — L03115 Cellulitis of right lower limb: Secondary | ICD-10-CM

## 2022-06-15 DIAGNOSIS — I10 Essential (primary) hypertension: Secondary | ICD-10-CM | POA: Insufficient documentation

## 2022-06-15 DIAGNOSIS — I2510 Atherosclerotic heart disease of native coronary artery without angina pectoris: Secondary | ICD-10-CM | POA: Insufficient documentation

## 2022-06-15 DIAGNOSIS — D696 Thrombocytopenia, unspecified: Secondary | ICD-10-CM | POA: Diagnosis not present

## 2022-06-15 DIAGNOSIS — E7800 Pure hypercholesterolemia, unspecified: Secondary | ICD-10-CM | POA: Insufficient documentation

## 2022-06-15 LAB — BPAM PLATELET PHERESIS
Blood Product Expiration Date: 202402152359
Blood Product Expiration Date: 202402172359
ISSUE DATE / TIME: 202402140659
ISSUE DATE / TIME: 202402141216
Unit Type and Rh: 5100
Unit Type and Rh: 6200

## 2022-06-15 LAB — EBV AB TO VIRAL CAPSID AG PNL, IGG+IGM
EBV VCA IgG: >600 U/mL — ABNORMAL HIGH (ref 0.0–17.9)
EBV VCA IgM: <36 U/mL (ref 0.0–35.9)

## 2022-06-15 LAB — PREPARE PLATELET PHERESIS
Unit division: 0
Unit division: 0

## 2022-06-15 LAB — CBC
HCT: 34.9 % — ABNORMAL LOW (ref 39.0–52.0)
Hemoglobin: 11.3 g/dL — ABNORMAL LOW (ref 13.0–17.0)
MCH: 30.7 pg (ref 26.0–34.0)
MCHC: 32.4 g/dL (ref 30.0–36.0)
MCV: 94.8 fL (ref 80.0–100.0)
Platelets: 55 K/uL — ABNORMAL LOW (ref 150–400)
RBC: 3.68 MIL/uL — ABNORMAL LOW (ref 4.22–5.81)
RDW: 13.6 % (ref 11.5–15.5)
WBC: 13 K/uL — ABNORMAL HIGH (ref 4.0–10.5)
nRBC: 0 % (ref 0.0–0.2)

## 2022-06-15 NOTE — Progress Notes (Signed)
PROGRESS NOTE    Joel Hoffman  [id number] DOB: [birth year] DOA: 06/13/2022 PCP: Alfonso Patten, MD   Brief Narrative: Joel Hoffman is a 62 y.o. male with a history of CAD, hyperlipidemia, hypertension, obesity.  Patient presented secondary to right lower extremity pain, warmth, redness with evidence of nonresolving right lower leg cellulitis.  Patient also found to have severe thrombocytopenia.  Patient started on empiric IV antibiotics and hematology consulted for management of acute thrombocytopenia.   Assessment and Plan:  Severe thrombocytopenia Platelets of 5000 on admission.  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  Hematology was consulted on admission and have recommended steroids, IVIG, platelet transfusion to keep platelets greater than 10,000. -Hematology recommendations: IVIG, Solu-medrol  Right lower extremity cellulitis Present on admission.  Patient failed outpatient management while on doxycycline.  Blood cultures obtained on admission.  Patient started empirically on vancomycin and ceftriaxone.  Patient reports improvement of symptoms. -Continue vancomycin and cefepime IV -Follow-up blood culture results  CAD Hyperlipidemia Patient is on aspirin and Lipitor as an outpatient.  Aspirin held on admission secondary to severe thrombocytopenia. -Continue home Lipitor  Primary hypertension Patient is on Coreg and lisinopril as an outpatient. -Continue lisinopril and Coreg  Obesity Estimated body mass index is 38.92 kg/m as calculated from the following:   Height as of this encounter: 6' (1.829 m).   Weight as of this encounter: 130.2 kg.  DVT prophylaxis: SCDs Code Status:   Code Status: Full Code Family Communication: None at bedside Disposition Plan: Discharge home pending stable platelets/hematology oncology recommendations, improvement of cellulitis/transition to oral antibiotics   Consultants:  Hematology/oncology  Procedures:   None  Antimicrobials: Vancomycin IV Ceftriaxone IV   Subjective: Patient reports improvement of right leg.  Objective: BP (!) 151/88 (BP Location: Right Arm)   Pulse 89   Temp 98 F (36.7 C) (Oral)   Resp 18   Ht 6' (1.829 m)   Wt 130.2 kg   SpO2 99%   BMI 38.92 kg/m   Examination:  General exam: Appears calm and comfortable Respiratory system: Clear to auscultation. Respiratory effort normal. Cardiovascular system: S1 & S2 heard, RRR. No murmurs Gastrointestinal system: Abdomen is nondistended, soft and nontender. Normal bowel sounds heard. Central nervous system: Alert and oriented. No focal neurological deficits. Musculoskeletal: RLE edema. No calf tenderness Skin: No cyanosis. Right LE erythema is lessened Psychiatry: Judgement and insight appear normal. Mood & affect appropriate.    Data Reviewed: I have personally reviewed following labs and imaging studies  CBC Lab Results  Component Value Date   WBC 13.0 (H) 06/15/2022   RBC 3.68 (L) 06/15/2022   HGB 11.3 (L) 06/15/2022   HCT 34.9 (L) 06/15/2022   MCV 94.8 06/15/2022   MCH 30.7 06/15/2022   PLT 55 (L) 06/15/2022   MCHC 32.4 06/15/2022   RDW 13.6 06/15/2022   LYMPHSABS 2.0 06/14/2022   MONOABS 0.8 06/14/2022   EOSABS 0.3 06/14/2022   BASOSABS 0.1 [id number]     Last metabolic panel Lab Results  Component Value Date   NA 140 06/14/2022   K 3.9 06/14/2022   CL 109 06/14/2022   CO2 23 06/14/2022   BUN 21 06/14/2022   CREATININE 0.85 06/14/2022   GLUCOSE 107 (H) 06/14/2022   GFRNONAA >60 06/14/2022   GFRAA >60 05/03/2017   CALCIUM 8.9 06/14/2022   PROT 7.9 05/03/2017   ALBUMIN 4.6 05/03/2017   BILITOT 0.8 05/03/2017   ALKPHOS 77 05/03/2017   AST 29 05/03/2017  ALT 26 05/03/2017   ANIONGAP 8 06/14/2022    GFR: Estimated Creatinine Clearance: 124.1 mL/min (by C-G formula based on SCr of 0.85 mg/dL).  Recent Results (from the past 240 hour(s))  Culture, blood (Routine X 2) w Reflex  to ID Panel     Status: None (Preliminary result)   Collection Time: 06/14/22  1:35 AM   Specimen: BLOOD  Result Value Ref Range Status   Specimen Description   Final    BLOOD RIGHT ANTECUBITAL Performed at [facility], [address] [place], [place] [address]    Special Requests   Final    BOTTLES DRAWN AEROBIC AND ANAEROBIC Blood Culture adequate volume Performed at [facility], [address] [place], [place] [address]    Culture   Final    NO GROWTH < 24 HOURS Performed at [facility], [place] [address] [place], [place] [address]    Report Status PENDING  Incomplete  Culture, blood (Routine X 2) w Reflex to ID Panel     Status: None (Preliminary result)   Collection Time: 06/14/22  1:45 AM   Specimen: BLOOD  Result Value Ref Range Status   Specimen Description   Final    BLOOD LEFT ANTECUBITAL Performed at [facility], [address] [place], [place] [address]    Special Requests   Final    BOTTLES DRAWN AEROBIC ONLY Blood Culture adequate volume Performed at [facility], [address] [place], [place] [address]    Culture   Final    NO GROWTH < 24 HOURS Performed at [facility], [place] [address] [place], [place] [address]    Report Status PENDING  Incomplete  MRSA Next Gen by PCR, Nasal     Status: None   Collection Time: 06/14/22  6:26 AM   Specimen: Nasal Swab  Result Value Ref Range Status   MRSA by PCR Next Gen NOT DETECTED NOT DETECTED Final    Comment: (NOTE) The GeneXpert MRSA Assay (FDA approved for NASAL specimens only), is one component of a comprehensive MRSA colonization surveillance program. It is not intended to diagnose MRSA infection nor to guide or monitor treatment for MRSA infections. Test performance is not FDA approved in patients less than 44 years old. Performed at [facility], [place] [address], [place],  [address]       Radiology Studies: CT Head  Wo Contrast  Result Date: 06/14/2022 CLINICAL DATA:  Polytrauma, thrombocytopenia EXAM: CT HEAD WITHOUT CONTRAST TECHNIQUE: Contiguous axial images were obtained from the base of the skull through the vertex without intravenous contrast. RADIATION DOSE REDUCTION: This exam was performed according to the departmental dose-optimization program which includes automated exposure control, adjustment of the mA and/or kV according to patient size and/or use of iterative reconstruction technique. COMPARISON:  11/29/2018 FINDINGS: Brain: No evidence of acute infarction, hemorrhage, mass, mass effect, or midline shift. No hydrocephalus or extra-axial fluid collection. Vascular: Contrast is noted within the vasculature prior CT abdomen pelvis. Skull: Negative for fracture or focal lesion. Sinuses/Orbits: No acute finding. Other: The mastoid air cells are well aerated. IMPRESSION: No acute intracranial process. Electronically Signed   By: Merilyn Baba M.D.   On: 06/14/2022 02:39   CT ABDOMEN PELVIS W CONTRAST  Result Date: 06/14/2022 CLINICAL DATA:  Thrombocytopenia.  History of gastric ulcer. EXAM: CT ABDOMEN AND PELVIS WITH CONTRAST TECHNIQUE: Multidetector CT imaging of the abdomen and  pelvis was performed using the standard protocol following bolus administration of intravenous contrast. RADIATION DOSE REDUCTION: This exam was performed according to the departmental dose-optimization program which includes automated exposure control, adjustment of the mA and/or kV according to patient size and/or use of iterative reconstruction technique. CONTRAST:  185m OMNIPAQUE IOHEXOL 300 MG/ML  SOLN COMPARISON:  12/19/2018. FINDINGS: Lower chest: Coronary artery calcifications are noted. Hepatobiliary: There is a vague hypodensity in the left lobe of the liver measuring 2.3 x 1.2 cm, previously characterized as a hemangioma. No biliary ductal dilatation. Small stones are present in the gallbladder. Pancreas: Unremarkable. No  pancreatic ductal dilatation or surrounding inflammatory changes. Spleen: Normal in size without focal abnormality. Adrenals/Urinary Tract: The adrenal glands are within normal limits. The kidneys enhance symmetrically. Renal calculi are present on the right. No hydroureteronephrosis. The bladder is unremarkable. Stomach/Bowel: Stomach is within normal limits. Appendix appears normal. No evidence of bowel wall thickening, distention, or inflammatory changes. No free air or pneumatosis. Scattered diverticula are present along the colon without evidence of diverticulitis. Vascular/Lymphatic: Aortic atherosclerosis. No enlarged abdominal or pelvic lymph nodes. Reproductive: Prostate is unremarkable. Other: No abdominopelvic ascites. Musculoskeletal: Degenerative changes in the thoracolumbar spine. IMPRESSION: 1. No acute intra-abdominal process. 2. Nonobstructive right renal calculi. 3. Aortic atherosclerosis and coronary artery calcifications. Electronically Signed   By: LBrett FairyM.D.   On: 06/14/2022 02:18   UKoreaVenous Img Lower Unilateral Right (DVT)  Result Date: 06/13/2022 CLINICAL DATA:  Right calf pain and swelling. EXAM: RIGHT LOWER EXTREMITY VENOUS DOPPLER ULTRASOUND TECHNIQUE: Gray-scale sonography with graded compression, as well as color Doppler and duplex ultrasound were performed to evaluate the lower extremity deep venous systems from the level of the common femoral vein and including the common femoral, femoral, profunda femoral, popliteal and calf veins including the posterior tibial, peroneal and gastrocnemius veins when visible. The superficial great saphenous vein was also interrogated. Spectral Doppler was utilized to evaluate flow at rest and with distal augmentation maneuvers in the common femoral, femoral and popliteal veins. COMPARISON:  None Available. FINDINGS: Contralateral Common Femoral Vein: Respiratory phasicity is normal and symmetric with the symptomatic side. No evidence of  thrombus. Normal compressibility. Common Femoral Vein: No evidence of thrombus. Normal compressibility, respiratory phasicity and response to augmentation. Saphenofemoral Junction: No evidence of thrombus. Normal compressibility and flow on color Doppler imaging. Profunda Femoral Vein: No evidence of thrombus. Normal compressibility and flow on color Doppler imaging. Femoral Vein: No evidence of thrombus. Normal compressibility, respiratory phasicity and response to augmentation. Popliteal Vein: No evidence of thrombus. Normal compressibility, respiratory phasicity and response to augmentation. Calf Veins: The RIGHT posterior tibial vein and right peroneal vein are poorly visualized. Superficial Great Saphenous Vein: No evidence of thrombus. Normal compressibility. Venous Reflux:  None. Other Findings:  None. IMPRESSION: Limited visualization of the RIGHT posterior tibial vein and RIGHT peroneal vein, without evidence of DVT within the RIGHT lower extremity. Electronically Signed   By: TVirgina NorfolkM.D.   On: 06/13/2022 21:41      LOS: 1 day    RCordelia Poche MD [facility] 06/15/2022, 2:50 PM   If 7PM-7AM, please contact night-coverage [email or web address]

## 2022-06-15 NOTE — Progress Notes (Signed)
Mobility Specialist - Progress Note   06/15/22 1605  Mobility  Activity Ambulated independently in hallway  Level of Assistance Independent  Assistive Device None  Distance Ambulated (ft) 1400 ft  Range of Motion/Exercises Active  Activity Response Tolerated well  Mobility Referral Yes  $Mobility charge 1 Mobility   Pt was found sitting EOB and agreeable to ambulate. Had no complaints and stated feeling good with no pain. At EOS continued ambulating with RN and NT aware.   Ferd Hibbs Mobility Specialist

## 2022-06-16 DIAGNOSIS — I10 Essential (primary) hypertension: Secondary | ICD-10-CM | POA: Diagnosis not present

## 2022-06-16 DIAGNOSIS — D696 Thrombocytopenia, unspecified: Secondary | ICD-10-CM | POA: Diagnosis not present

## 2022-06-16 DIAGNOSIS — L03115 Cellulitis of right lower limb: Secondary | ICD-10-CM | POA: Diagnosis not present

## 2022-06-16 DIAGNOSIS — D693 Immune thrombocytopenic purpura: Secondary | ICD-10-CM | POA: Diagnosis not present

## 2022-06-16 LAB — CBC
HCT: 28.4 % — ABNORMAL LOW (ref 39.0–52.0)
Hemoglobin: 9.5 g/dL — ABNORMAL LOW (ref 13.0–17.0)
MCH: 32.1 pg (ref 26.0–34.0)
MCHC: 33.5 g/dL (ref 30.0–36.0)
MCV: 95.9 fL (ref 80.0–100.0)
Platelets: 101 10*3/uL — ABNORMAL LOW (ref 150–400)
RBC: 2.96 MIL/uL — ABNORMAL LOW (ref 4.22–5.81)
RDW: 13.9 % (ref 11.5–15.5)
WBC: 8.4 10*3/uL (ref 4.0–10.5)
nRBC: 0 % (ref 0.0–0.2)

## 2022-06-16 MED ORDER — DEXAMETHASONE 20 MG PO TABS: 20.0000 mg | ORAL_TABLET | Freq: Every day | ORAL | 0 refills | Status: DC

## 2022-06-16 MED ORDER — DEXAMETHASONE 4 MG PO TABS: 20.0000 mg | ORAL_TABLET | Freq: Every day | ORAL | 0 refills | Status: AC

## 2022-06-16 MED ORDER — CEFADROXIL 500 MG PO CAPS: 500.0000 mg | ORAL_CAPSULE | Freq: Two times a day (BID) | ORAL | 0 refills | Status: AC

## 2022-06-16 NOTE — Discharge Instructions (Signed)
Joel Hoffman,  You were in the hospital with cellulitis and low platelets. Your cellulitis has improved and your platelet count has increased towards a normal number. Please continue antibiotics and follow-up with your PCP and the hematologist.

## 2022-06-16 NOTE — Progress Notes (Signed)
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 No acute bleeding issues.  Platelet counts have improved 101k.  MEDICAL HISTORY:  Past Medical History:  Diagnosis Date   Coronary artery disease    Gastric ulcer    Heart attack ([place])    Hypercholesteremia    Hypertension     SURGICAL HISTORY: Past Surgical History:  Procedure Laterality Date   CORONARY ANGIOPLASTY WITH STENT PLACEMENT     TONSILLECTOMY      SOCIAL HISTORY: Social History   Socioeconomic History   Marital status: Single    Spouse name: Not on file   Number of children: Not on file   Years of education: Not on file   Highest education level: Not on file  Occupational History   Occupation: Programmer, systems: Building surveyor FOR SELF EMPLOYED    Comment: Retired from [place] Use   Smoking status: Never   Smokeless tobacco: Never  Vaping Use   Vaping Use: Never used  Substance and Sexual Activity   Alcohol use: No    Comment: "Quit 24 years ago"   Drug use: No   Sexual activity: Not Currently  Other Topics Concern   Not on file  Social History Narrative   Not on file   Social Determinants of Health   Financial Resource Strain: Not on file  Food Insecurity: No Food Insecurity (06/14/2022)   Hunger Vital Sign    Worried About Running Out of Food in the Last Year: Never true    Ran Out of Food in the Last Year: Never true  Transportation Needs: No Transportation Needs (06/14/2022)   PRAPARE - Hydrologist (Medical): No    Lack of Transportation (Non-Medical): No  Physical Activity: Not on file  Stress: Not on file  Social Connections: Not on file  Intimate Partner Violence: Not At Risk  (06/14/2022)   Humiliation, Afraid, Rape, and Kick questionnaire    Fear of Current or Ex-Partner: No    Emotionally Abused: No    Physically Abused: No    Sexually Abused: No    FAMILY HISTORY: Family History  Problem Relation Age of Onset   Dementia Mother    Cancer Father    Diabetes Father    Emphysema Father    Colon cancer Father    Hypertension Brother     ALLERGIES:  is allergic to ampicillin.  MEDICATIONS:  Current Facility-Administered Medications  Medication Dose Route Frequency Provider Last Rate Last Admin   acetaminophen (TYLENOL) tablet 650 mg  650 mg Oral Q6H PRN Crosley, Debby, MD   650 mg at 06/14/22 0958   carvedilol (COREG) tablet 12.5 mg  12.5 mg Oral BID WC Crosley, Debby, MD   12.5 mg at 06/16/22 0845   cefTRIAXone (ROCEPHIN) 2 g in sodium chloride 0.9 % 100 mL IVPB  2 g Intravenous Daily Crosley, Debby, MD 200 mL/hr at 06/16/22 0859 2 g at 06/16/22 0859   dexamethasone (DECADRON) tablet 20 mg  20 mg Oral Daily Brunetta Genera, MD   20 mg at 06/16/22 0845   HYDROcodone-acetaminophen (NORCO/VICODIN) 5-325 MG per tablet 1-2 tablet  1-2 tablet Oral Q4H PRN Mariel Aloe, MD  influenza vac split quadrivalent PF (FLUARIX) injection 0.5 mL  0.5 mL Intramuscular Tomorrow-1000 Crosley, Debby, MD       lisinopril (ZESTRIL) tablet 10 mg  10 mg Oral Daily Crosley, Debby, MD   10 mg at 06/16/22 0845   vancomycin (VANCOREADY) IVPB 1500 mg/300 mL  1,500 mg Intravenous Q12H Mariel Aloe, MD 150 mL/hr at 06/16/22 0858 1,500 mg at 06/16/22 0858    REVIEW OF SYSTEMS:    .10 Point review of Systems was done is negative except as noted above.   PHYSICAL EXAMINATION: ECOG PERFORMANCE STATUS: 1 - Symptomatic but completely ambulatory  . Vitals:   06/16/22 0315 06/16/22 0432  BP: (!) 141/82 (!) 101/34  Pulse: 72 78  Resp:  20  Temp: 98.4 F (36.9 C) 97.9 F (36.6 C)  SpO2: 99% 98%   Filed Weights   06/13/22 1704  Weight: 287 lb (130.2 kg)   .Body  mass index is 38.92 kg/m. Marland Kitchen GENERAL:alert, in no acute distress and comfortable LUNGS: clear to auscultation b/l with normal respiratory effort HEART: regular rate & rhythm ABDOMEN:  normoactive bowel sounds , non tender, not distended. Extremity: Left lower extremity cellulitis improving NEURO: no focal motor/sensory deficits   LABORATORY DATA:  I have reviewed the data as listed  .Marland Kitchen    Latest Ref Rng & Units 06/16/2022    4:42 AM 06/15/2022   10:05 AM 06/14/2022    2:32 PM  CBC  WBC 4.0 - 10.5 K/uL 8.4  13.0  7.4   Hemoglobin 13.0 - 17.0 g/dL 9.5  11.3  11.9   Hematocrit 39.0 - 52.0 % 28.4  34.9  36.4   Platelets 150 - 400 K/uL 101  55  13        Latest Ref Rng & Units 06/14/2022    4:29 AM 06/13/2022    5:05 PM 05/03/2017   10:59 PM  CMP  Glucose 70 - 99 mg/dL 107  122  164   BUN 8 - 23 mg/dL 21  19  20   $ Creatinine 0.61 - 1.24 mg/dL 0.85  1.09  0.79   Sodium 135 - 145 mmol/L 140  139  142   Potassium 3.5 - 5.1 mmol/L 3.9  3.7  4.6   Chloride 98 - 111 mmol/L 109  108  102   CO2 22 - 32 mmol/L 23  24  27   $ Calcium 8.9 - 10.3 mg/dL 8.9  8.4  9.3   Total Protein 6.5 - 8.1 g/dL   7.9   Total Bilirubin 0.3 - 1.2 mg/dL   0.8   Alkaline Phos 38 - 126 U/L   77   AST 15 - 41 U/L   29   ALT 17 - 63 U/L   26      RADIOGRAPHIC STUDIES: I have personally reviewed the radiological images as listed and agreed with the findings in the report. CT Head Wo Contrast  Result Date: 06/14/2022 CLINICAL DATA:  Polytrauma, thrombocytopenia EXAM: CT HEAD WITHOUT CONTRAST TECHNIQUE: Contiguous axial images were obtained from the base of the skull through the vertex without intravenous contrast. RADIATION DOSE REDUCTION: This exam was performed according to the departmental dose-optimization program which includes automated exposure control, adjustment of the mA and/or kV according to patient size and/or use of iterative reconstruction technique. COMPARISON:  11/29/2018 FINDINGS: Brain: No  evidence of acute infarction, hemorrhage, mass, mass effect, or midline shift. No hydrocephalus or extra-axial fluid collection. Vascular: Contrast is noted within the vasculature  prior CT abdomen pelvis. Skull: Negative for fracture or focal lesion. Sinuses/Orbits: No acute finding. Other: The mastoid air cells are well aerated. IMPRESSION: No acute intracranial process. Electronically Signed   By: Merilyn Baba M.D.   On: 06/14/2022 02:39   CT ABDOMEN PELVIS W CONTRAST  Result Date: 06/14/2022 CLINICAL DATA:  Thrombocytopenia.  History of gastric ulcer. EXAM: CT ABDOMEN AND PELVIS WITH CONTRAST TECHNIQUE: Multidetector CT imaging of the abdomen and pelvis was performed using the standard protocol following bolus administration of intravenous contrast. RADIATION DOSE REDUCTION: This exam was performed according to the departmental dose-optimization program which includes automated exposure control, adjustment of the mA and/or kV according to patient size and/or use of iterative reconstruction technique. CONTRAST:  15m OMNIPAQUE IOHEXOL 300 MG/ML  SOLN COMPARISON:  12/19/2018. FINDINGS: Lower chest: Coronary artery calcifications are noted. Hepatobiliary: There is a vague hypodensity in the left lobe of the liver measuring 2.3 x 1.2 cm, previously characterized as a hemangioma. No biliary ductal dilatation. Small stones are present in the gallbladder. Pancreas: Unremarkable. No pancreatic ductal dilatation or surrounding inflammatory changes. Spleen: Normal in size without focal abnormality. Adrenals/Urinary Tract: The adrenal glands are within normal limits. The kidneys enhance symmetrically. Renal calculi are present on the right. No hydroureteronephrosis. The bladder is unremarkable. Stomach/Bowel: Stomach is within normal limits. Appendix appears normal. No evidence of bowel wall thickening, distention, or inflammatory changes. No free air or pneumatosis. Scattered diverticula are present along the colon  without evidence of diverticulitis. Vascular/Lymphatic: Aortic atherosclerosis. No enlarged abdominal or pelvic lymph nodes. Reproductive: Prostate is unremarkable. Other: No abdominopelvic ascites. Musculoskeletal: Degenerative changes in the thoracolumbar spine. IMPRESSION: 1. No acute intra-abdominal process. 2. Nonobstructive right renal calculi. 3. Aortic atherosclerosis and coronary artery calcifications. Electronically Signed   By: LBrett FairyM.D.   On: 06/14/2022 02:18   UKoreaVenous Img Lower Unilateral Right (DVT)  Result Date: 06/13/2022 CLINICAL DATA:  Right calf pain and swelling. EXAM: RIGHT LOWER EXTREMITY VENOUS DOPPLER ULTRASOUND TECHNIQUE: Gray-scale sonography with graded compression, as well as color Doppler and duplex ultrasound were performed to evaluate the lower extremity deep venous systems from the level of the common femoral vein and including the common femoral, femoral, profunda femoral, popliteal and calf veins including the posterior tibial, peroneal and gastrocnemius veins when visible. The superficial great saphenous vein was also interrogated. Spectral Doppler was utilized to evaluate flow at rest and with distal augmentation maneuvers in the common femoral, femoral and popliteal veins. COMPARISON:  None Available. FINDINGS: Contralateral Common Femoral Vein: Respiratory phasicity is normal and symmetric with the symptomatic side. No evidence of thrombus. Normal compressibility. Common Femoral Vein: No evidence of thrombus. Normal compressibility, respiratory phasicity and response to augmentation. Saphenofemoral Junction: No evidence of thrombus. Normal compressibility and flow on color Doppler imaging. Profunda Femoral Vein: No evidence of thrombus. Normal compressibility and flow on color Doppler imaging. Femoral Vein: No evidence of thrombus. Normal compressibility, respiratory phasicity and response to augmentation. Popliteal Vein: No evidence of thrombus. Normal  compressibility, respiratory phasicity and response to augmentation. Calf Veins: The RIGHT posterior tibial vein and right peroneal vein are poorly visualized. Superficial Great Saphenous Vein: No evidence of thrombus. Normal compressibility. Venous Reflux:  None. Other Findings:  None. IMPRESSION: Limited visualization of the RIGHT posterior tibial vein and RIGHT peroneal vein, without evidence of DVT within the RIGHT lower extremity. Electronically Signed   By: TVirgina NorfolkM.D.   On: 06/13/2022 21:41    ASSESSMENT & PLAN:  62 year old male with  #1 severe newly noted thrombocytopenia with platelets of less than 5k. 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 Immature platelet fraction of 37.2% suggesting excellent bone marrow response. Use of high doses of ibuprofen over the last 5 days could also be a concern. Normal TSH B12 and folate levels Monospot test negative All of the patients questions were answered with apparent satisfaction. The patient knows to call the clinic with any problems, questions or concerns. CT abdomen pelvis done today shows no hepatosplenomegaly or signs of chronic liver disease.  #2 right lower extremity cellulitis following injury to the right leg with skin breach.-On ceftriaxone and vancomycin per hospitalist.  #3 hypertension #4 coronary disease status post remote history of PCI x 2 #5 obesity  Plan -Patient notes no acute new symptoms.  Has tolerated his IVIG and steroids without any issues.  . -Labs today show platelet counts improved to 101k. -Slight drop in hemoglobin which will need to be monitored.  Patient has not had any bleeding issues or overt.  Could be somewhat diluted sample with all the IV fluids and IV antibiotics the patient is getting.  Could also be some element of mild hemolysis with IVIG. -No bleeding or bruising issues and his cellulitis is improving. -Complete  dexamethasone 20 mg daily for 4 days.  Plz Discharge on prednisone 20 mg p.o. daily with 10-day supply till he can be seen in hematology clinic. -Patient okay to discharge from hematology standpoint.  Will set him up for clinic visit on 06/22/2022 at 1:30 PM with Dr. Irene Limbo as outpatient in the cancer clinic. -Antibiotics as per Dr. Lonny Prude -Would hold aspirin at this time till his platelet stability is ensured upon follow-up in clinic. -Avoid medications that could worsen thrombocytopenia. He has discussed with Dr. Lonny Prude.   The total time spent in the appointment was 25 minutes*.  All of the patient's questions were answered with apparent satisfaction. The patient knows to call the clinic with any problems, questions or concerns.   Sullivan Lone MD MS AAHIVMS [facility] Hematology/Oncology Physician [facility]  .*Total Encounter Time as defined by the Centers for Medicare and Medicaid Services includes, in addition to the face-to-face time of a patient visit (documented in the note above) non-face-to-face time: obtaining and reviewing outside history, ordering and reviewing medications, tests or procedures, care coordination (communications with other health care professionals or caregivers) and documentation in the medical record.

## 2022-06-16 NOTE — Progress Notes (Addendum)
Pharmacy Antibiotic Note  Joel Hoffman is a 62 y.o. male admitted on 06/13/2022 with cellulitis.  Pharmacy has been consulted for vancomycin dosing.  Pt presents with lower extremity cellulitis. Pt was on doxycycline for 1 week with minimal improvement.   Plan: Continue Vancomycin 1.5 gm IV q12h ( AUC 539, SCr 0.85, TBW 1302. Kg)  Ceftiaxone 2 gm IV q24h  Monitor clinical course, renal function, cultures as available Serum Creatinine ordered for 06/17/22  Height: 6' (182.9 cm) Weight: 130.2 kg (287 lb) IBW/kg (Calculated) : 77.6  Temp (24hrs), Avg:98 F (36.7 C), Min:97.7 F (36.5 C), Max:98.4 F (36.9 C)  Recent Labs  Lab 06/13/22 1705 06/14/22 0150 06/14/22 0429 06/14/22 1432 06/15/22 1005 06/16/22 0442  WBC 8.3 8.1 8.9 7.4 13.0* 8.4  CREATININE 1.09  --  0.85  --   --   --   LATICACIDVEN 1.3  --   --   --   --   --      Estimated Creatinine Clearance: 124.1 mL/min (by C-G formula based on SCr of 0.85 mg/dL).    Allergies  Allergen Reactions   Ampicillin Rash    Antimicrobials this admission:  2/14 ceftriaxone >> 2/20 2/14 vancomycin >>   Microbiology results: 2/14 BCx: 2/14 BCx: ngtd 2/14 MRSA PCR: negative  Thank you for allowing pharmacy to be a part of this patient's care.   Mendel Ryder, PharmD Clinical Pharmacist 06/16/2022 11:57 AM

## 2022-06-16 NOTE — Progress Notes (Signed)
Pt discharged to home. Prior to dc, IV removed. Pt given dc paperwork regarding medications, appointments, and condition. Pt verbalized understanding and stated no concerns. Pt stable at time of dc and left in vehicle driven by brother.

## 2022-06-16 NOTE — Discharge Summary (Signed)
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)   Cellulitis of right lower leg   Coronary artery disease   Hypertension   Hypercholesteremia  Resolved Problems:   * No resolved hospital problems. *  Hospital Course: Traiton Hoffman is a 62 y.o. male with a history of CAD, hyperlipidemia, hypertension, obesity.  Patient presented secondary to right lower extremity pain, warmth, redness with evidence of nonresolving right lower leg cellulitis.  Patient also found to have severe thrombocytopenia.  Patient started on empiric IV antibiotics and hematology consulted for management of acute thrombocytopenia. Patient's cellulitis improved with IV antibiotics. Platelets improved with IVIG x2, Solu-medrol > decadron and platelet transfusion. Patient transitioned to Cefadroxil on discharge per ID recommendations and patient to follow-up with hematology oncology as an outpatient.  Assessment and Plan:  Severe thrombocytopenia Platelets of less than 5000 on admission.  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  Hematology was consulted on admission and started Solumedrol IV, IVIG and ordered 2 units of platelets for transfusion. Patient with steady improvement of platelets with therapy. Platelets of 101,000 on day of discharge. Will discharge on decadron to complete 4 day course. Patient to follow-up with hematology as an outpatient.   Right lower extremity cellulitis Present on admission.  Patient failed outpatient management while on doxycycline.  Blood cultures obtained on admission.  Patient started empirically on vancomycin and ceftriaxone.   Patient reports improvement of symptoms. Discussed with ID who recommended transition to Cefadroxil on discharge. Blood cultures with no growth to date.   CAD Hyperlipidemia Patient is on aspirin and Lipitor as an outpatient.  Aspirin held on admission secondary to severe thrombocytopenia. Continue home Lipitor. Aspirin held on admission; resume per hematology recommendations whenever able.   Primary hypertension Patient is on Coreg and lisinopril as an outpatient. Continue lisinopril and Coreg.   Obesity Estimated body mass index is 38.92 kg/m as calculated from the following:   Height as of this encounter: 6' (1.829 m).   Weight as of this encounter: 130.2 kg.   Consultants: Hematology, Infectious disease (curbside) Procedures performed: None  Disposition: Home Diet recommendation: Cardiac diet   DISCHARGE MEDICATION: Allergies as of 06/16/2022       Reactions   Ampicillin Rash        Medication List     STOP taking these medications    aspirin EC 81 MG tablet   doxycycline 50 MG capsule Commonly known as: VIBRAMYCIN   ibuprofen 200 MG tablet Commonly known as: ADVIL       TAKE these medications    acetaminophen 500 MG tablet Commonly known as: TYLENOL Take 500 mg by mouth in the morning and at bedtime.   atorvastatin 80 MG tablet Commonly known as: LIPITOR Take 80 mg by mouth daily.   carvedilol 12.5 MG tablet Commonly known as: COREG Take 12.5 mg by mouth 2 (two) times daily.   cefadroxil 500 MG capsule Commonly known as: DURICEF Take 1 capsule (500 mg total) by mouth 2 (two) times daily for 7 days. Start taking on: June 17, 2022   CENTRUM SILVER 50+MEN PO Take 1 tablet by mouth daily.   dexAMETHasone 20 MG  Tabs Take 20 mg by mouth daily for 1 day. Start taking on: June 17, 2022   lisinopril 10 MG tablet Commonly known as: ZESTRIL Take 10 mg by mouth daily.   SYSTANE OP Place 2 drops into the right eye in the morning and at  bedtime.        Discharge Exam: BP (!) 101/34 (BP Location: Right Arm)   Pulse 78   Temp 97.9 F (36.6 C) (Oral)   Resp 20   Ht 6' (1.829 m)   Wt 130.2 kg   SpO2 98%   BMI 38.92 kg/m   General exam: Appears calm and comfortable Respiratory system: Respiratory effort normal. Central nervous system: Alert and oriented. No focal neurological deficits. Musculoskeletal: No edema. No calf tenderness Skin: No cyanosis. RLE with mild-moderate erythema Psychiatry: Judgement and insight appear normal. Mood & affect appropriate.   Condition at discharge: stable  The results of significant diagnostics from this hospitalization (including imaging, microbiology, ancillary and laboratory) are listed below for reference.   Imaging Studies: CT Head Wo Contrast  Result Date: 06/14/2022 CLINICAL DATA:  Polytrauma, thrombocytopenia EXAM: CT HEAD WITHOUT CONTRAST TECHNIQUE: Contiguous axial images were obtained from the base of the skull through the vertex without intravenous contrast. RADIATION DOSE REDUCTION: This exam was performed according to the departmental dose-optimization program which includes automated exposure control, adjustment of the mA and/or kV according to patient size and/or use of iterative reconstruction technique. COMPARISON:  11/29/2018 FINDINGS: Brain: No evidence of acute infarction, hemorrhage, mass, mass effect, or midline shift. No hydrocephalus or extra-axial fluid collection. Vascular: Contrast is noted within the vasculature prior CT abdomen pelvis. Skull: Negative for fracture or focal lesion. Sinuses/Orbits: No acute finding. Other: The mastoid air cells are well aerated. IMPRESSION: No acute intracranial process. Electronically Signed   By: Merilyn Baba M.D.   On: 06/14/2022 02:39   CT ABDOMEN PELVIS W CONTRAST  Result Date: 06/14/2022 CLINICAL DATA:  Thrombocytopenia.  History of gastric ulcer. EXAM: CT ABDOMEN AND PELVIS WITH CONTRAST TECHNIQUE: Multidetector CT  imaging of the abdomen and pelvis was performed using the standard protocol following bolus administration of intravenous contrast. RADIATION DOSE REDUCTION: This exam was performed according to the departmental dose-optimization program which includes automated exposure control, adjustment of the mA and/or kV according to patient size and/or use of iterative reconstruction technique. CONTRAST:  134m OMNIPAQUE IOHEXOL 300 MG/ML  SOLN COMPARISON:  12/19/2018. FINDINGS: Lower chest: Coronary artery calcifications are noted. Hepatobiliary: There is a vague hypodensity in the left lobe of the liver measuring 2.3 x 1.2 cm, previously characterized as a hemangioma. No biliary ductal dilatation. Small stones are present in the gallbladder. Pancreas: Unremarkable. No pancreatic ductal dilatation or surrounding inflammatory changes. Spleen: Normal in size without focal abnormality. Adrenals/Urinary Tract: The adrenal glands are within normal limits. The kidneys enhance symmetrically. Renal calculi are present on the right. No hydroureteronephrosis. The bladder is unremarkable. Stomach/Bowel: Stomach is within normal limits. Appendix appears normal. No evidence of bowel wall thickening, distention, or inflammatory changes. No free air or pneumatosis. Scattered diverticula are present along the colon without evidence of diverticulitis. Vascular/Lymphatic: Aortic atherosclerosis. No enlarged abdominal or pelvic lymph nodes. Reproductive: Prostate is unremarkable. Other: No abdominopelvic ascites. Musculoskeletal: Degenerative changes in the thoracolumbar spine. IMPRESSION: 1. No acute intra-abdominal process. 2. Nonobstructive right renal calculi. 3. Aortic atherosclerosis and coronary artery calcifications. Electronically Signed   By: LBrett FairyM.D.   On: 06/14/2022 02:18   UKoreaVenous Img Lower Unilateral Right (  DVT)  Result Date: 06/13/2022 CLINICAL DATA:  Right calf pain and swelling. EXAM: RIGHT LOWER EXTREMITY  VENOUS DOPPLER ULTRASOUND TECHNIQUE: Gray-scale sonography with graded compression, as well as color Doppler and duplex ultrasound were performed to evaluate the lower extremity deep venous systems from the level of the common femoral vein and including the common femoral, femoral, profunda femoral, popliteal and calf veins including the posterior tibial, peroneal and gastrocnemius veins when visible. The superficial great saphenous vein was also interrogated. Spectral Doppler was utilized to evaluate flow at rest and with distal augmentation maneuvers in the common femoral, femoral and popliteal veins. COMPARISON:  None Available. FINDINGS: Contralateral Common Femoral Vein: Respiratory phasicity is normal and symmetric with the symptomatic side. No evidence of thrombus. Normal compressibility. Common Femoral Vein: No evidence of thrombus. Normal compressibility, respiratory phasicity and response to augmentation. Saphenofemoral Junction: No evidence of thrombus. Normal compressibility and flow on color Doppler imaging. Profunda Femoral Vein: No evidence of thrombus. Normal compressibility and flow on color Doppler imaging. Femoral Vein: No evidence of thrombus. Normal compressibility, respiratory phasicity and response to augmentation. Popliteal Vein: No evidence of thrombus. Normal compressibility, respiratory phasicity and response to augmentation. Calf Veins: The RIGHT posterior tibial vein and right peroneal vein are poorly visualized. Superficial Great Saphenous Vein: No evidence of thrombus. Normal compressibility. Venous Reflux:  None. Other Findings:  None. IMPRESSION: Limited visualization of the RIGHT posterior tibial vein and RIGHT peroneal vein, without evidence of DVT within the RIGHT lower extremity. Electronically Signed   By: Virgina Norfolk M.D.   On: 06/13/2022 21:41    Microbiology: Results for orders placed or performed during the hospital encounter of 06/13/22  Culture, blood (Routine X  2) w Reflex to ID Panel     Status: None (Preliminary result)   Collection Time: 06/14/22  1:35 AM   Specimen: BLOOD  Result Value Ref Range Status   Specimen Description   Final    BLOOD RIGHT ANTECUBITAL Performed at [facility], [address] [place], [place] [address]    Special Requests   Final    BOTTLES DRAWN AEROBIC AND ANAEROBIC Blood Culture adequate volume Performed at [facility], [address] [place], [place] [address]    Culture   Final    NO GROWTH 2 DAYS Performed at [facility], [place] [address] [place], [place] [address]    Report Status PENDING  Incomplete  Culture, blood (Routine X 2) w Reflex to ID Panel     Status: None (Preliminary result)   Collection Time: 06/14/22  1:45 AM   Specimen: BLOOD  Result Value Ref Range Status   Specimen Description   Final    BLOOD LEFT ANTECUBITAL Performed at [facility], [address] [place], [place] [address]    Special Requests   Final    BOTTLES DRAWN AEROBIC ONLY Blood Culture adequate volume Performed at [facility], [address] [place], [place] [address]    Culture   Final    NO GROWTH 2 DAYS Performed at [facility], [place] [address], [place], [place] [address]    Report Status PENDING  Incomplete  MRSA Next Gen by PCR, Nasal     Status: None   Collection Time: 06/14/22  6:26 AM   Specimen: Nasal Swab  Result Value Ref Range Status   MRSA by PCR Next Gen NOT DETECTED NOT DETECTED Final    Comment: (  NOTE) The GeneXpert MRSA Assay (FDA approved for NASAL specimens only), is one component of a comprehensive MRSA colonization surveillance program. It is not intended to diagnose MRSA infection nor to guide or monitor treatment for MRSA infections. Test performance is not FDA approved in patients less than 50 years old. Performed at [facility], [place] [address], [place],  [address]     Labs: CBC: Recent Labs   Lab 06/13/22 1705 06/14/22 0150 06/14/22 0429 06/14/22 1432 06/15/22 1005 06/16/22 0442  WBC 8.3 8.1 8.9 7.4 13.0* 8.4  NEUTROABS 5.5 5.1 5.7  --   --   --   HGB 11.9* 11.5* 11.7* 11.9* 11.3* 9.5*  HCT 36.1* 34.7* 36.0* 36.4* 34.9* 28.4*  MCV 90.5 91.8 93.8 94.1 94.8 95.9  PLT 5* <5* <5* 13* 55* [id number]*   Basic Metabolic Panel: Recent Labs  Lab 06/13/22 1705 06/14/22 0429  NA 139 140  K 3.7 3.9  CL 108 109  CO2 24 23  GLUCOSE 122* 107*  BUN 19 21  CREATININE 1.09 0.85  CALCIUM 8.4* 8.9    Discharge time spent: 35 minutes.  Signed: Cordelia Poche, MD [facility] 06/16/2022

## 2022-06-16 NOTE — Progress Notes (Signed)
.   Transition of Care [facility]) Screening Note   Patient Details  Name: Joel Hoffman Date of Birth: 04/01/1961   Transition of Care [facility]) CM/SW Contact:    Illene Regulus, LCSW Phone Number: 06/16/2022, 9:41 AM    Transition of Care Department [facility]) has reviewed patient and no TOC needs have been identified at this time. We will continue to monitor patient advancement through interdisciplinary progression rounds. If new patient transition needs arise, please place a TOC consult.

## 2022-06-16 NOTE — Progress Notes (Signed)
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  He notes no acute new symptoms.  His leg pain has significantly improved and his redness along the leg is improved and is regressing from the marked borders of his cellulitis. He has tolerated his IVIG without any reactions or acute concerns.  No issues with the steroids.  His platelets have improved progressively and today up to 55k No bleeding or other abnormal bruising issues. Patient is in good spirits.  MEDICAL HISTORY:  Past Medical History:  Diagnosis Date   Coronary artery disease    Gastric ulcer    Heart attack ([place])    Hypercholesteremia    Hypertension     SURGICAL HISTORY: Past Surgical History:  Procedure Laterality Date   CORONARY ANGIOPLASTY WITH STENT PLACEMENT     TONSILLECTOMY      SOCIAL HISTORY: Social History   Socioeconomic History   Marital status: Single    Spouse name: Not on file   Number of children: Not on file   Years of education: Not on file   Highest education level: Not on file  Occupational History   Occupation: Programmer, systems: Building surveyor FOR SELF EMPLOYED    Comment: Retired from [employer] Use   Smoking status: Never   Smokeless tobacco: Never  Vaping Use   Vaping Use: Never used  Substance and Sexual Activity   Alcohol use: No    Comment: "Quit 24 years ago"   Drug use: No   Sexual activity: Not Currently  Other Topics Concern   Not on file  Social History Narrative   Not on file   Social Determinants of Health   Financial Resource Strain: Not on file  Food Insecurity: No Food Insecurity (06/14/2022)   Hunger Vital Sign    Worried About Running Out of Food in the Last Year: Never true    Ran Out of Food in the Last Year: Never true  Transportation  Needs: No Transportation Needs (06/14/2022)   PRAPARE - Hydrologist (Medical): No    Lack of Transportation (Non-Medical): No  Physical Activity: Not on file  Stress: Not on file  Social Connections: Not on file  Intimate Partner Violence: Not At Risk (06/14/2022)   Humiliation, Afraid, Rape, and Kick questionnaire    Fear of Current or Ex-Partner: No    Emotionally Abused: No    Physically Abused: No    Sexually Abused: No    FAMILY HISTORY: Family History  Problem Relation Age of Onset   Dementia Mother    Cancer Father    Diabetes Father    Emphysema Father    Colon cancer Father    Hypertension Brother     ALLERGIES:  is allergic to ampicillin.  MEDICATIONS:  Current Facility-Administered Medications  Medication Dose Route Frequency Provider Last Rate Last Admin   acetaminophen (TYLENOL) tablet 650 mg  650 mg Oral Q6H PRN Crosley, Debby, MD   650 mg at 06/14/22 0958   carvedilol (COREG) tablet 12.5 mg  12.5 mg Oral BID WC Crosley, Debby, MD   12.5 mg at 06/16/22 0845   cefTRIAXone (ROCEPHIN) 2 g in sodium chloride 0.9 % 100 mL IVPB  2 g Intravenous Daily Quintella Baton, MD 200 mL/hr at 06/16/22 0859 2  g at 06/16/22 0859   dexamethasone (DECADRON) tablet 20 mg  20 mg Oral Daily Brunetta Genera, MD   20 mg at 06/16/22 0845   HYDROcodone-acetaminophen (NORCO/VICODIN) 5-325 MG per tablet 1-2 tablet  1-2 tablet Oral Q4H PRN Mariel Aloe, MD       influenza vac split quadrivalent PF (FLUARIX) injection 0.5 mL  0.5 mL Intramuscular Tomorrow-1000 Crosley, Debby, MD       lisinopril (ZESTRIL) tablet 10 mg  10 mg Oral Daily Crosley, Debby, MD   10 mg at 06/16/22 0845   vancomycin (VANCOREADY) IVPB 1500 mg/300 mL  1,500 mg Intravenous Q12H Mariel Aloe, MD 150 mL/hr at 06/16/22 0858 1,500 mg at 06/16/22 0858    REVIEW OF SYSTEMS:    .10 Point review of Systems was done is negative except as noted above.   PHYSICAL EXAMINATION: ECOG  PERFORMANCE STATUS: 1 - Symptomatic but completely ambulatory  . Vitals:   06/16/22 0315 06/16/22 0432  BP: (!) 141/82 (!) 101/34  Pulse: 72 78  Resp:  20  Temp: 98.4 F (36.9 C) 97.9 F (36.6 C)  SpO2: 99% 98%   Filed Weights   06/13/22 1704  Weight: 287 lb (130.2 kg)   .Body mass index is 38.92 kg/m. Joel Hoffman GENERAL:alert, in no acute distress and comfortable LUNGS: clear to auscultation b/l with normal respiratory effort HEART: regular rate & rhythm ABDOMEN:  normoactive bowel sounds , non tender, not distended. Extremity: Left lower extremity cellulitis improving NEURO: no focal motor/sensory deficits   LABORATORY DATA:  I have reviewed the data as listed  .    Latest Ref Rng & Units 06/15/2022   10:05 AM 06/14/2022    2:32 PM  CBC  WBC 4.0 - 10.5 K/uL 13.0  7.4   Hemoglobin 13.0 - 17.0 g/dL 11.3  11.9   Hematocrit 39.0 - 52.0 % 34.9  36.4   Platelets 150 - 400 K/uL 55  13     .    Latest Ref Rng & Units 06/14/2022    4:29 AM 06/13/2022    5:05 PM 05/03/2017   10:59 PM  CMP  Glucose 70 - 99 mg/dL 107  122  164   BUN 8 - 23 mg/dL 21  19  20   $ Creatinine 0.61 - 1.24 mg/dL 0.85  1.09  0.79   Sodium 135 - 145 mmol/L 140  139  142   Potassium 3.5 - 5.1 mmol/L 3.9  3.7  4.6   Chloride 98 - 111 mmol/L 109  108  102   CO2 22 - 32 mmol/L 23  24  27   $ Calcium 8.9 - 10.3 mg/dL 8.9  8.4  9.3   Total Protein 6.5 - 8.1 g/dL   7.9   Total Bilirubin 0.3 - 1.2 mg/dL   0.8   Alkaline Phos 38 - 126 U/L   77   AST 15 - 41 U/L   29   ALT 17 - 63 U/L   26      RADIOGRAPHIC STUDIES: I have personally reviewed the radiological images as listed and agreed with the findings in the report. CT Head Wo Contrast  Result Date: 06/14/2022 CLINICAL DATA:  Polytrauma, thrombocytopenia EXAM: CT HEAD WITHOUT CONTRAST TECHNIQUE: Contiguous axial images were obtained from the base of the skull through the vertex without intravenous contrast. RADIATION DOSE REDUCTION: This exam was performed  according to the departmental dose-optimization program which includes automated exposure control, adjustment of the mA and/or kV  according to patient size and/or use of iterative reconstruction technique. COMPARISON:  11/29/2018 FINDINGS: Brain: No evidence of acute infarction, hemorrhage, mass, mass effect, or midline shift. No hydrocephalus or extra-axial fluid collection. Vascular: Contrast is noted within the vasculature prior CT abdomen pelvis. Skull: Negative for fracture or focal lesion. Sinuses/Orbits: No acute finding. Other: The mastoid air cells are well aerated. IMPRESSION: No acute intracranial process. Electronically Signed   By: Merilyn Baba M.D.   On: 06/14/2022 02:39   CT ABDOMEN PELVIS W CONTRAST  Result Date: 06/14/2022 CLINICAL DATA:  Thrombocytopenia.  History of gastric ulcer. EXAM: CT ABDOMEN AND PELVIS WITH CONTRAST TECHNIQUE: Multidetector CT imaging of the abdomen and pelvis was performed using the standard protocol following bolus administration of intravenous contrast. RADIATION DOSE REDUCTION: This exam was performed according to the departmental dose-optimization program which includes automated exposure control, adjustment of the mA and/or kV according to patient size and/or use of iterative reconstruction technique. CONTRAST:  176m OMNIPAQUE IOHEXOL 300 MG/ML  SOLN COMPARISON:  12/19/2018. FINDINGS: Lower chest: Coronary artery calcifications are noted. Hepatobiliary: There is a vague hypodensity in the left lobe of the liver measuring 2.3 x 1.2 cm, previously characterized as a hemangioma. No biliary ductal dilatation. Small stones are present in the gallbladder. Pancreas: Unremarkable. No pancreatic ductal dilatation or surrounding inflammatory changes. Spleen: Normal in size without focal abnormality. Adrenals/Urinary Tract: The adrenal glands are within normal limits. The kidneys enhance symmetrically. Renal calculi are present on the right. No hydroureteronephrosis. The  bladder is unremarkable. Stomach/Bowel: Stomach is within normal limits. Appendix appears normal. No evidence of bowel wall thickening, distention, or inflammatory changes. No free air or pneumatosis. Scattered diverticula are present along the colon without evidence of diverticulitis. Vascular/Lymphatic: Aortic atherosclerosis. No enlarged abdominal or pelvic lymph nodes. Reproductive: Prostate is unremarkable. Other: No abdominopelvic ascites. Musculoskeletal: Degenerative changes in the thoracolumbar spine. IMPRESSION: 1. No acute intra-abdominal process. 2. Nonobstructive right renal calculi. 3. Aortic atherosclerosis and coronary artery calcifications. Electronically Signed   By: LBrett FairyM.D.   On: 06/14/2022 02:18   [place]Venous Img Lower Unilateral Right (DVT)  Result Date: 06/13/2022 CLINICAL DATA:  Right calf pain and swelling. EXAM: RIGHT LOWER EXTREMITY VENOUS DOPPLER ULTRASOUND TECHNIQUE: Gray-scale sonography with graded compression, as well as color Doppler and duplex ultrasound were performed to evaluate the lower extremity deep venous systems from the level of the common femoral vein and including the common femoral, femoral, profunda femoral, popliteal and calf veins including the posterior tibial, peroneal and gastrocnemius veins when visible. The superficial great saphenous vein was also interrogated. Spectral Doppler was utilized to evaluate flow at rest and with distal augmentation maneuvers in the common femoral, femoral and popliteal veins. COMPARISON:  None Available. FINDINGS: Contralateral Common Femoral Vein: Respiratory phasicity is normal and symmetric with the symptomatic side. No evidence of thrombus. Normal compressibility. Common Femoral Vein: No evidence of thrombus. Normal compressibility, respiratory phasicity and response to augmentation. Saphenofemoral Junction: No evidence of thrombus. Normal compressibility and flow on color Doppler imaging. Profunda Femoral Vein: No  evidence of thrombus. Normal compressibility and flow on color Doppler imaging. Femoral Vein: No evidence of thrombus. Normal compressibility, respiratory phasicity and response to augmentation. Popliteal Vein: No evidence of thrombus. Normal compressibility, respiratory phasicity and response to augmentation. Calf Veins: The RIGHT posterior tibial vein and right peroneal vein are poorly visualized. Superficial Great Saphenous Vein: No evidence of thrombus. Normal compressibility. Venous Reflux:  None. Other Findings:  None. IMPRESSION: Limited visualization of  the RIGHT posterior tibial vein and RIGHT peroneal vein, without evidence of DVT within the RIGHT lower extremity. Electronically Signed   By: Virgina Norfolk M.D.   On: 06/13/2022 21:41    ASSESSMENT & PLAN:   62 year old male with  #1 severe newly noted thrombocytopenia with platelets of less than 5k. 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 Immature platelet fraction of 37.2% suggesting excellent bone marrow response. Use of high doses of ibuprofen over the last 5 days could also be a concern. Normal TSH B12 and folate levels Monospot test negative All of the patients questions were answered with apparent satisfaction. The patient knows to call the clinic with any problems, questions or concerns. CT abdomen pelvis done today shows no hepatosplenomegaly or signs of chronic liver disease.  #2 right lower extremity cellulitis following injury to the right leg with skin breach.-On ceftriaxone and vancomycin per hospitalist.  #3 hypertension #4 coronary disease status post remote history of PCI x 2 #5 obesity  Plan -Labs done today were reviewed in detail with the patient.  His platelet counts have improved to 55k No bleeding or bruising issues and his cellulitis is improving. No issues with toxicities with his steroids or IVIG at this time -Considering the  importance of aspirin to his post PCI status and significant coronary artery disease could consider restarting baby aspirin when his platelet counts remain more than 50k on at least 3-4 successive days. -Absolutely no NSAIDs -Prophylactic platelet transfusions to maintain platelet counts of more than 10k.   -Complete dexamethasone 20 mg daily for 4 days.  Plz Discharge on prednisone 20 mg p.o. daily with 10-day supply till he can be seen in hematology clinic -Status post IVIG 1 g/kg every 24 hours for 2 doses  -Will continue to follow and set up for outpatient follow-up with hematology to monitor platelet counts and treat his ITP. -Avoid medications that could worsen thrombocytopenia.  The total time spent in the appointment was 35 minutes*.  All of the patient's questions were answered with apparent satisfaction. The patient knows to call the clinic with any problems, questions or concerns.   Sullivan Lone MD MS AAHIVMS [facility] Hematology/Oncology Physician [facility]  .*Total Encounter Time as defined by the Centers for Medicare and Medicaid Services includes, in addition to the face-to-face time of a patient visit (documented in the note above) non-face-to-face time: obtaining and reviewing outside history, ordering and reviewing medications, tests or procedures, care coordination (communications with other health care professionals or caregivers) and documentation in the medical record.

## 2022-06-19 ENCOUNTER — Telehealth: Payer: Self-pay | Admitting: Hematology

## 2022-06-19 LAB — CULTURE, BLOOD (ROUTINE X 2)
Culture: NO GROWTH
Culture: NO GROWTH
Special Requests: ADEQUATE
Special Requests: ADEQUATE

## 2022-06-19 NOTE — Telephone Encounter (Signed)
Scheduled appt per 2/16 staff msg. Called pt, no answer. Left msg with appt date/time. Requested for pt to call back to confirm appt.

## 2022-06-22 ENCOUNTER — Inpatient Hospital Stay

## 2022-06-22 ENCOUNTER — Other Ambulatory Visit: Payer: Self-pay

## 2022-06-22 ENCOUNTER — Inpatient Hospital Stay: Attending: Hematology | Admitting: Hematology

## 2022-06-22 VITALS — HR 79 | Temp 97.9°F | Resp 20

## 2022-06-22 DIAGNOSIS — Z79899 Other long term (current) drug therapy: Secondary | ICD-10-CM | POA: Diagnosis not present

## 2022-06-22 DIAGNOSIS — E7800 Pure hypercholesterolemia, unspecified: Secondary | ICD-10-CM | POA: Diagnosis not present

## 2022-06-22 DIAGNOSIS — Z8711 Personal history of peptic ulcer disease: Secondary | ICD-10-CM | POA: Diagnosis not present

## 2022-06-22 DIAGNOSIS — D693 Immune thrombocytopenic purpura: Secondary | ICD-10-CM

## 2022-06-22 DIAGNOSIS — I10 Essential (primary) hypertension: Secondary | ICD-10-CM | POA: Insufficient documentation

## 2022-06-22 DIAGNOSIS — D696 Thrombocytopenia, unspecified: Secondary | ICD-10-CM | POA: Diagnosis present

## 2022-06-22 DIAGNOSIS — I252 Old myocardial infarction: Secondary | ICD-10-CM | POA: Insufficient documentation

## 2022-06-22 DIAGNOSIS — I2510 Atherosclerotic heart disease of native coronary artery without angina pectoris: Secondary | ICD-10-CM | POA: Insufficient documentation

## 2022-06-22 DIAGNOSIS — L03115 Cellulitis of right lower limb: Secondary | ICD-10-CM | POA: Insufficient documentation

## 2022-06-22 LAB — CBC WITH DIFFERENTIAL (CANCER CENTER ONLY)
Abs Immature Granulocytes: 0.05 10*3/uL (ref 0.00–0.07)
Basophils Absolute: 0.1 10*3/uL (ref 0.0–0.1)
Basophils Relative: 1 %
Eosinophils Absolute: 0.3 10*3/uL (ref 0.0–0.5)
Eosinophils Relative: 3 %
HCT: 36.1 % — ABNORMAL LOW (ref 39.0–52.0)
Hemoglobin: 12.2 g/dL — ABNORMAL LOW (ref 13.0–17.0)
Immature Granulocytes: 1 %
Lymphocytes Relative: 24 %
Lymphs Abs: 2.4 10*3/uL (ref 0.7–4.0)
MCH: 30.6 pg (ref 26.0–34.0)
MCHC: 33.8 g/dL (ref 30.0–36.0)
MCV: 90.5 fL (ref 80.0–100.0)
Monocytes Absolute: 0.9 10*3/uL (ref 0.1–1.0)
Monocytes Relative: 9 %
Neutro Abs: 6.5 10*3/uL (ref 1.7–7.7)
Neutrophils Relative %: 62 %
Platelet Count: 591 10*3/uL — ABNORMAL HIGH (ref 150–400)
RBC: 3.99 MIL/uL — ABNORMAL LOW (ref 4.22–5.81)
RDW: 13.7 % (ref 11.5–15.5)
WBC Count: 10.2 10*3/uL (ref 4.0–10.5)
nRBC: 0 % (ref 0.0–0.2)

## 2022-06-22 LAB — CMP (CANCER CENTER ONLY)
ALT: 42 U/L (ref 0–44)
AST: 34 U/L (ref 15–41)
Albumin: 3.4 g/dL — ABNORMAL LOW (ref 3.5–5.0)
Alkaline Phosphatase: 66 U/L (ref 38–126)
Anion gap: 4 — ABNORMAL LOW (ref 5–15)
BUN: 16 mg/dL (ref 8–23)
CO2: 28 mmol/L (ref 22–32)
Calcium: 8.7 mg/dL — ABNORMAL LOW (ref 8.9–10.3)
Chloride: 107 mmol/L (ref 98–111)
Creatinine: 0.67 mg/dL (ref 0.61–1.24)
GFR, Estimated: >60 mL/min (ref >60)
Glucose, Bld: 89 mg/dL (ref 70–99)
Potassium: 4.1 mmol/L (ref 3.5–5.1)
Sodium: 139 mmol/L (ref 135–145)
Total Bilirubin: 0.6 mg/dL (ref 0.3–1.2)
Total Protein: 7.6 g/dL (ref 6.5–8.1)

## 2022-06-22 LAB — IMMATURE PLATELET FRACTION: Immature Platelet Fraction: 4.2 % (ref 1.2–8.6)

## 2022-06-22 NOTE — Progress Notes (Signed)
HEMATOLOGY/ONCOLOGY CONSULTATION NOTE  Date of Service: 06/22/2022  Patient Care Team: Alfonso Patten, MD as PCP - General (Cardiology)  CHIEF COMPLAINTS/PURPOSE OF CONSULTATION:  Evaluation and management of severe thrombocytopenia with platelet count of less than 5K.  HISTORY OF PRESENTING ILLNESS:  Joel Hoffman is a wonderful 62 y.o. male who has been referred to [place] by Dr Riley Lam MD for evaluation and management of thrombocytopenia.   Patient has a history of hypertension, dyslipidemia, coronary disease status post MI with 2 stents placed more than 5 years ago, remote history of gastric ulcer.   Patient presented to the emergency room with right lower extremity pain and warmth redness and increasing discomfort.  He notes that he had had 2 traumas to the same area on his right leg after which he started having leg pain and swelling.  He was started on doxycycline as outpatient and noted that this failed to resolve his leg pain which was worsening and therefore he came to the emergency room.   In the emergency room the patient was noted to have severe right lower extremity cellulitis and was started on IV ceftriaxone and vancomycin. 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  He was seen by Irene Pap, DO on 06/14/2022 and complained of right leg pain, swelling, and redness. Lab work from the ED revealed severe thrombocytopenia with platelet count of less than 5K.  No prior history of thrombocytopenia.   Today, he reports some soreness and discomfort in the rt leg but does not experience as much pain as last week. He describes some tenderness in his back right leg. There is some swelling in his right leg but no swelling in his right thigh. He reports that his leg is  not painful when moving around. He has been taking cefadroxil compliantly. He reports that he elevates his leg for at least an hour every day.  He denies any bleeding issues, fevers, or new symptoms. He notes that he does not take ibuprofen. He reports that as not yet FU with his PCP. He denies any issues with his veins and no hx of chronic leg swelling.   MEDICAL HISTORY:  Past Medical History:  Diagnosis Date   Coronary artery disease    Gastric ulcer    Heart attack ([place])    Hypercholesteremia    Hypertension     SURGICAL HISTORY: Past Surgical History:  Procedure Laterality Date   CORONARY ANGIOPLASTY WITH STENT PLACEMENT     TONSILLECTOMY      SOCIAL HISTORY: Social History   Socioeconomic History   Marital status: Single    Spouse name: Not on file   Number of children: Not on file   Years of education: Not on file   Highest education level: Not on file  Occupational History   Occupation: Programmer, systems: Building surveyor FOR SELF EMPLOYED    Comment: Retired from [employer] Use   Smoking status: Never   Smokeless tobacco: Never  Vaping Use   Vaping Use: Never used  Substance and Sexual Activity   Alcohol use: No    Comment: "Quit 24 years ago"   Drug use: No   Sexual activity: Not Currently  Other Topics Concern   Not on file  Social History Narrative  Not on file   Social Determinants of Health   Financial Resource Strain: Not on file  Food Insecurity: No Food Insecurity (06/14/2022)   Hunger Vital Sign    Worried About Running Out of Food in the Last Year: Never true    Ran Out of Food in the Last Year: Never true  Transportation Needs: No Transportation Needs (06/14/2022)   PRAPARE - Hydrologist (Medical): No    Lack of Transportation (Non-Medical): No  Physical Activity: Not on file  Stress: Not on file  Social Connections: Not on file  Intimate Partner Violence: Not At Risk (06/14/2022)   Humiliation,  Afraid, Rape, and Kick questionnaire    Fear of Current or Ex-Partner: No    Emotionally Abused: No    Physically Abused: No    Sexually Abused: No    FAMILY HISTORY: Family History  Problem Relation Age of Onset   Dementia Mother    Cancer Father    Diabetes Father    Emphysema Father    Colon cancer Father    Hypertension Brother     ALLERGIES:  is allergic to ampicillin.  MEDICATIONS:  Current Outpatient Medications  Medication Sig Dispense Refill   acetaminophen (TYLENOL) 500 MG tablet Take 500 mg by mouth in the morning and at bedtime.     atorvastatin (LIPITOR) 80 MG tablet Take 80 mg by mouth daily.     carvedilol (COREG) 12.5 MG tablet Take 12.5 mg by mouth 2 (two) times daily.     cefadroxil (DURICEF) 500 MG capsule Take 1 capsule (500 mg total) by mouth 2 (two) times daily for 7 days. 14 capsule 0   lisinopril (ZESTRIL) 10 MG tablet Take 10 mg by mouth daily.     Multiple Vitamins-Minerals (CENTRUM SILVER 50+MEN PO) Take 1 tablet by mouth daily.     Polyethyl Glycol-Propyl Glycol (SYSTANE OP) Place 2 drops into the right eye in the morning and at bedtime.     No current facility-administered medications for this visit.    REVIEW OF SYSTEMS:    10 Point review of Systems was done is negative except as noted above.  PHYSICAL EXAMINATION: ECOG PERFORMANCE STATUS: 1 - Symptomatic but completely ambulatory  . Vitals:   06/22/22 1300  Pulse: 79  Resp: 20  Temp: 97.9 F (36.6 C)  SpO2: 97%   GENERAL:alert, in no acute distress and comfortable SKIN: no acute rashes, no significant lesions EYES: conjunctiva are pink and non-injected, sclera anicteric OROPHARYNX: MMM, no exudates, no oropharyngeal erythema or ulceration NECK: supple, no JVD LYMPH:  no palpable lymphadenopathy in the cervical, axillary or inguinal regions LUNGS: clear to auscultation b/l with normal respiratory effort HEART: regular rate & rhythm ABDOMEN:  normoactive bowel sounds , non  tender, not distended. Extremity: rt leg decreased redness and swelling. PSYCH: alert & oriented x 3 with fluent speech NEURO: no focal motor/sensory deficits  LABORATORY DATA:  I have reviewed the data as listed .    Latest Ref Rng & Units 06/22/2022    1:19 PM 06/16/2022    4:42 AM 06/15/2022   10:05 AM  CBC  WBC 4.0 - 10.5 K/uL 10.2  8.4  13.0   Hemoglobin 13.0 - 17.0 g/dL 12.2  9.5  11.3   Hematocrit 39.0 - 52.0 % 36.1  28.4  34.9   Platelets 150 - 400 K/uL 591  101  55    .    Latest Ref Rng & Units 06/22/2022  1:19 PM 06/14/2022    4:29 AM 06/13/2022    5:05 PM  CMP  Glucose 70 - 99 mg/dL 89  107  122   BUN 8 - 23 mg/dL '16  21  19   '$ Creatinine 0.61 - 1.24 mg/dL 0.67  0.85  1.09   Sodium 135 - 145 mmol/L 139  140  139   Potassium 3.5 - 5.1 mmol/L 4.1  3.9  3.7   Chloride 98 - 111 mmol/L 107  109  108   CO2 22 - 32 mmol/L '28  23  24   '$ Calcium 8.9 - 10.3 mg/dL 8.7  8.9  8.4   Total Protein 6.5 - 8.1 g/dL 7.6     Total Bilirubin 0.3 - 1.2 mg/dL 0.6     Alkaline Phos 38 - 126 U/L 66     AST 15 - 41 U/L 34     ALT 0 - 44 U/L 42         RADIOGRAPHIC STUDIES: I have personally reviewed the radiological images as listed and agreed with the findings in the report. CT Head Wo Contrast  Result Date: 06/14/2022 CLINICAL DATA:  Polytrauma, thrombocytopenia EXAM: CT HEAD WITHOUT CONTRAST TECHNIQUE: Contiguous axial images were obtained from the base of the skull through the vertex without intravenous contrast. RADIATION DOSE REDUCTION: This exam was performed according to the departmental dose-optimization program which includes automated exposure control, adjustment of the mA and/or kV according to patient size and/or use of iterative reconstruction technique. COMPARISON:  11/29/2018 FINDINGS: Brain: No evidence of acute infarction, hemorrhage, mass, mass effect, or midline shift. No hydrocephalus or extra-axial fluid collection. Vascular: Contrast is noted within the vasculature  prior CT abdomen pelvis. Skull: Negative for fracture or focal lesion. Sinuses/Orbits: No acute finding. Other: The mastoid air cells are well aerated. IMPRESSION: No acute intracranial process. Electronically Signed   By: Merilyn Baba M.D.   On: 06/14/2022 02:39   CT ABDOMEN PELVIS W CONTRAST  Result Date: 06/14/2022 CLINICAL DATA:  Thrombocytopenia.  History of gastric ulcer. EXAM: CT ABDOMEN AND PELVIS WITH CONTRAST TECHNIQUE: Multidetector CT imaging of the abdomen and pelvis was performed using the standard protocol following bolus administration of intravenous contrast. RADIATION DOSE REDUCTION: This exam was performed according to the departmental dose-optimization program which includes automated exposure control, adjustment of the mA and/or kV according to patient size and/or use of iterative reconstruction technique. CONTRAST:  168m OMNIPAQUE IOHEXOL 300 MG/ML  SOLN COMPARISON:  12/19/2018. FINDINGS: Lower chest: Coronary artery calcifications are noted. Hepatobiliary: There is a vague hypodensity in the left lobe of the liver measuring 2.3 x 1.2 cm, previously characterized as a hemangioma. No biliary ductal dilatation. Small stones are present in the gallbladder. Pancreas: Unremarkable. No pancreatic ductal dilatation or surrounding inflammatory changes. Spleen: Normal in size without focal abnormality. Adrenals/Urinary Tract: The adrenal glands are within normal limits. The kidneys enhance symmetrically. Renal calculi are present on the right. No hydroureteronephrosis. The bladder is unremarkable. Stomach/Bowel: Stomach is within normal limits. Appendix appears normal. No evidence of bowel wall thickening, distention, or inflammatory changes. No free air or pneumatosis. Scattered diverticula are present along the colon without evidence of diverticulitis. Vascular/Lymphatic: Aortic atherosclerosis. No enlarged abdominal or pelvic lymph nodes. Reproductive: Prostate is unremarkable. Other: No  abdominopelvic ascites. Musculoskeletal: Degenerative changes in the thoracolumbar spine. IMPRESSION: 1. No acute intra-abdominal process. 2. Nonobstructive right renal calculi. 3. Aortic atherosclerosis and coronary artery calcifications. Electronically Signed   By: LRegan RakersD.  On: 06/14/2022 02:18   US Venous Img Lower Unilateral Right (DVT)  Result Date: 06/13/2022 CLINICAL DATA:  Right calf pain and swelling. EXAM: RIGHT LOWER EXTREMITY VENOUS DOPPLER ULTRASOUND TECHNIQUE: Gray-scale sonography with graded compression, as well as color Doppler and duplex ultrasound were performed to evaluate the lower extremity deep venous systems from the level of the common femoral vein and including the common femoral, femoral, profunda femoral, popliteal and calf veins including the posterior tibial, peroneal and gastrocnemius veins when visible. The superficial great saphenous vein was also interrogated. Spectral Doppler was utilized to evaluate flow at rest and with distal augmentation maneuvers in the common femoral, femoral and popliteal veins. COMPARISON:  None Available. FINDINGS: Contralateral Common Femoral Vein: Respiratory phasicity is normal and symmetric with the symptomatic side. No evidence of thrombus. Normal compressibility. Common Femoral Vein: No evidence of thrombus. Normal compressibility, respiratory phasicity and response to augmentation. Saphenofemoral Junction: No evidence of thrombus. Normal compressibility and flow on color Doppler imaging. Profunda Femoral Vein: No evidence of thrombus. Normal compressibility and flow on color Doppler imaging. Femoral Vein: No evidence of thrombus. Normal compressibility, respiratory phasicity and response to augmentation. Popliteal Vein: No evidence of thrombus. Normal compressibility, respiratory phasicity and response to augmentation. Calf Veins: The RIGHT posterior tibial vein and right peroneal vein are poorly visualized. Superficial Great  Saphenous Vein: No evidence of thrombus. Normal compressibility. Venous Reflux:  None. Other Findings:  None. IMPRESSION: Limited visualization of the RIGHT posterior tibial vein and RIGHT peroneal vein, without evidence of DVT within the RIGHT lower extremity. Electronically Signed   By: Virgina Norfolk M.D.   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 Immature platelet fraction of 37.2% suggesting excellent bone marrow response. Use of high doses of ibuprofen over the last 5 days could also be a concern. Normal TSH B12 and folate levels Monospot test negative All of the patients questions were answered with apparent satisfaction. The patient knows to call the clinic with any problems, questions or concerns. CT abdomen pelvis done today shows no hepatosplenomegaly or signs of chronic liver disease.   #2 right lower extremity cellulitis following injury to the right leg with skin breach.-On ceftriaxone and vancomycin per hospitalist.  #3 hypertension #4 coronary disease status post remote history of PCI x 2 #5 obesity  PLAN:  -Recommend patient to continue to regularly elevate right LE leg and to keep area moisturized.  -labs done today show plt counts of 591k suggesting good response to IVIg and his steroids. -he has completed 4 day induction course of steroids. Given normalization of platelets will hold additional steroids especially in the context of severe cellulitis. -cellulitis improving and recommend patient to FU with PCP in next week to ensure edema continues to resolve -recommend compression socks to improve right LE edema -patient has not yet returned to work as he has been limited by his LE edema -order labs today  . Orders Placed This Encounter   Procedures   CMP ([facility] only)    Standing Status:   Future    Number of Occurrences:   1    Standing Expiration Date:   06/23/2023   CBC with Differential ([facility] Only)    Standing Status:   Future    Number of Occurrences:  1    Standing Expiration Date:   06/23/2023   Immature Platelet Fraction    Standing Status:   Future    Number of Occurrences:   1    Standing Expiration Date:   06/23/2023    FOLLOW-UP: RTC with Dr Irene Limbo with labs in 2 weeks  The total time spent in the appointment was 30 minutes* .  All of the patient's questions were answered with apparent satisfaction. The patient knows to call the clinic with any problems, questions or concerns.   Sullivan Lone MD MS AAHIVMS [facility] Hematology/Oncology Physician [facility]  .*Total Encounter Time as defined by the Centers for Medicare and Medicaid Services includes, in addition to the face-to-face time of a patient visit (documented in the note above) non-face-to-face time: obtaining and reviewing outside history, ordering and reviewing medications, tests or procedures, care coordination (communications with other health care professionals or caregivers) and documentation in the medical record.   I,Joel Hoffman,acting as a Education administrator for Sullivan Lone, MD.,have documented all relevant documentation on the behalf of Sullivan Lone, MD,as directed by  Sullivan Lone, MD while in the presence of Sullivan Lone, MD.  .I have reviewed the above documentation for accuracy and completeness, and I agree with the above. Brunetta Genera MD

## 2022-07-07 ENCOUNTER — Other Ambulatory Visit: Payer: Self-pay

## 2022-07-07 DIAGNOSIS — D693 Immune thrombocytopenic purpura: Secondary | ICD-10-CM

## 2022-07-10 ENCOUNTER — Other Ambulatory Visit: Payer: Self-pay

## 2022-07-10 ENCOUNTER — Inpatient Hospital Stay

## 2022-07-10 ENCOUNTER — Inpatient Hospital Stay: Attending: Hematology | Admitting: Hematology

## 2022-07-10 VITALS — BP 152/94 | HR 71 | Temp 97.0°F | Resp 16 | Wt 291.1 lb

## 2022-07-10 DIAGNOSIS — I10 Essential (primary) hypertension: Secondary | ICD-10-CM | POA: Diagnosis not present

## 2022-07-10 DIAGNOSIS — L03115 Cellulitis of right lower limb: Secondary | ICD-10-CM | POA: Diagnosis not present

## 2022-07-10 DIAGNOSIS — D693 Immune thrombocytopenic purpura: Secondary | ICD-10-CM | POA: Insufficient documentation

## 2022-07-10 LAB — CBC WITH DIFFERENTIAL (CANCER CENTER ONLY)
Abs Immature Granulocytes: 0 10*3/uL (ref 0.00–0.07)
Basophils Absolute: 0.1 10*3/uL (ref 0.0–0.1)
Basophils Relative: 1 %
Eosinophils Absolute: 0.3 10*3/uL (ref 0.0–0.5)
Eosinophils Relative: 5 %
HCT: 37.5 % — ABNORMAL LOW (ref 39.0–52.0)
Hemoglobin: 12.5 g/dL — ABNORMAL LOW (ref 13.0–17.0)
Immature Granulocytes: 0 %
Lymphocytes Relative: 28 %
Lymphs Abs: 1.6 10*3/uL (ref 0.7–4.0)
MCH: 30.3 pg (ref 26.0–34.0)
MCHC: 33.3 g/dL (ref 30.0–36.0)
MCV: 90.8 fL (ref 80.0–100.0)
Monocytes Absolute: 0.6 10*3/uL (ref 0.1–1.0)
Monocytes Relative: 11 %
Neutro Abs: 3.2 10*3/uL (ref 1.7–7.7)
Neutrophils Relative %: 55 %
Platelet Count: 63 10*3/uL — ABNORMAL LOW (ref 150–400)
RBC: 4.13 MIL/uL — ABNORMAL LOW (ref 4.22–5.81)
RDW: 13.9 % (ref 11.5–15.5)
WBC Count: 5.8 10*3/uL (ref 4.0–10.5)
nRBC: 0 % (ref 0.0–0.2)

## 2022-07-10 LAB — CMP (CANCER CENTER ONLY)
ALT: 24 U/L (ref 0–44)
AST: 34 U/L (ref 15–41)
Albumin: 3.7 g/dL (ref 3.5–5.0)
Alkaline Phosphatase: 75 U/L (ref 38–126)
Anion gap: 3 — ABNORMAL LOW (ref 5–15)
BUN: 16 mg/dL (ref 8–23)
CO2: 29 mmol/L (ref 22–32)
Calcium: 9.2 mg/dL (ref 8.9–10.3)
Chloride: 108 mmol/L (ref 98–111)
Creatinine: 0.82 mg/dL (ref 0.61–1.24)
GFR, Estimated: >60 mL/min (ref >60)
Glucose, Bld: 82 mg/dL (ref 70–99)
Potassium: 4.1 mmol/L (ref 3.5–5.1)
Sodium: 140 mmol/L (ref 135–145)
Total Bilirubin: 0.5 mg/dL (ref 0.3–1.2)
Total Protein: 7.1 g/dL (ref 6.5–8.1)

## 2022-07-10 LAB — IMMATURE PLATELET FRACTION: Immature Platelet Fraction: 11.8 % — ABNORMAL HIGH (ref 1.2–8.6)

## 2022-07-10 MED ORDER — PREDNISONE 10 MG PO TABS: 20.0000 mg | ORAL_TABLET | Freq: Every day | ORAL | 0 refills | Status: AC

## 2022-07-10 NOTE — Progress Notes (Signed)
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 male who has been referred to [place] by Dr Riley Lam MD for evaluation and management of thrombocytopenia.   Patient has a history of hypertension, dyslipidemia, coronary disease status post MI with 2 stents placed more than 5 years ago, remote history of gastric ulcer.   Patient presented to the emergency room with right lower extremity pain and warmth redness and increasing discomfort.  He notes that he had had 2 traumas to the same area on his right leg after which he started having leg pain and swelling.  He was started on doxycycline as outpatient and noted that this failed to resolve his leg pain which was worsening and therefore he came to the emergency room.   In the emergency room the patient was noted to have severe right lower extremity cellulitis and was started on IV ceftriaxone and vancomycin. 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  Patient was last seen by me on 06/22/2022 and he complained of mild soreness, mild discomfort in his right leg, swelling on right leg. He denied any pain.   Patient reports he has been doing well overall without any new medical concerns since our last visit. Patient complains of right leg wound which he injured today while getting out of his car.   He denies fever, chills, night sweats, unexpected weight loss, abdominal pain, back pain, chest pain. He does complain bilateral leg swelling.   Patient  notes he has started working since our last visit. He notes that his cardiologist has started him on Aspirin 10 mg.    MEDICAL HISTORY:  Past Medical History:  Diagnosis Date   Coronary artery disease    Gastric ulcer    Heart attack ([place])    Hypercholesteremia    Hypertension     SURGICAL HISTORY: Past Surgical History:  Procedure Laterality Date   CORONARY ANGIOPLASTY WITH STENT PLACEMENT     TONSILLECTOMY      SOCIAL HISTORY: Social History   Socioeconomic History   Marital status: Single    Spouse name: Not on file   Number of children: Not on file   Years of education: Not on file   Highest education level: Not on file  Occupational History   Occupation: Programmer, systems: Building surveyor FOR SELF EMPLOYED    Comment: Retired from [employer] Use   Smoking status: Never   Smokeless tobacco: Never  Vaping Use   Vaping Use: Never used  Substance and Sexual Activity   Alcohol use: No    Comment: "Quit 24 years ago"   Drug use: No   Sexual activity: Not Currently  Other Topics Concern   Not on file  Social History Narrative   Not on file   Social Determinants of Health   Financial Resource Strain: Not on file  Food Insecurity: No Food Insecurity (06/14/2022)   Hunger Vital Sign    Worried About Running Out of Food in the Last Year: Never true    Ran  Out of Food in the Last Year: Never true  Transportation Needs: No Transportation Needs (06/14/2022)   PRAPARE - Hydrologist (Medical): No    Lack of Transportation (Non-Medical): No  Physical Activity: Not on file  Stress: Not on file  Social Connections: Not on file  Intimate Partner Violence: Not At Risk (06/14/2022)   Humiliation, Afraid, Rape, and Kick questionnaire    Fear of Current or Ex-Partner: No    Emotionally Abused: No    Physically Abused: No    Sexually Abused: No    FAMILY HISTORY: Family History  Problem Relation Age of Onset   Dementia Mother     Cancer Father    Diabetes Father    Emphysema Father    Colon cancer Father    Hypertension Brother     ALLERGIES:  is allergic to ampicillin.  MEDICATIONS:  Current Outpatient Medications  Medication Sig Dispense Refill   acetaminophen (TYLENOL) 500 MG tablet Take 500 mg by mouth in the morning and at bedtime.     atorvastatin (LIPITOR) 80 MG tablet Take 80 mg by mouth daily.     carvedilol (COREG) 12.5 MG tablet Take 12.5 mg by mouth 2 (two) times daily.     lisinopril (ZESTRIL) 10 MG tablet Take 10 mg by mouth daily.     Multiple Vitamins-Minerals (CENTRUM SILVER 50+MEN PO) Take 1 tablet by mouth daily.     Polyethyl Glycol-Propyl Glycol (SYSTANE OP) Place 2 drops into the right eye in the morning and at bedtime.     No current facility-administered medications for this visit.    REVIEW OF SYSTEMS:    10 Point review of Systems was done is negative except as noted above.  PHYSICAL EXAMINATION: ECOG PERFORMANCE STATUS: 1 - Symptomatic but completely ambulatory  . Vitals:   07/10/22 1230  BP: (!) 152/94  Pulse: 71  Resp: 16  Temp: (!) 97 F (36.1 C)  SpO2: 99%    GENERAL:alert, in no acute distress and comfortable SKIN: no acute rashes, no significant lesions EYES: conjunctiva are pink and non-injected, sclera anicteric OROPHARYNX: MMM, no exudates, no oropharyngeal erythema or ulceration NECK: supple, no JVD LYMPH:  no palpable lymphadenopathy in the cervical, axillary or inguinal regions LUNGS: clear to auscultation b/l with normal respiratory effort HEART: regular rate & rhythm ABDOMEN:  normoactive bowel sounds , non tender, not distended. Extremity: rt leg decreased redness and swelling. PSYCH: alert & oriented x 3 with fluent speech NEURO: no focal motor/sensory deficits  LABORATORY DATA:  I have reviewed the data as listed .    Latest Ref Rng & Units 07/10/2022   11:51 AM 06/22/2022    1:19 PM 06/16/2022    4:42 AM  CBC  WBC 4.0 - 10.5 K/uL 5.8  10.2   8.4   Hemoglobin 13.0 - 17.0 g/dL 12.5  12.2  9.5   Hematocrit 39.0 - 52.0 % 37.5  36.1  28.4   Platelets 150 - 400 K/uL 63  591  101    .    Latest Ref Rng & Units 07/10/2022   11:51 AM 06/22/2022    1:19 PM 06/14/2022    4:29 AM  CMP  Glucose 70 - 99 mg/dL 82  89  107   BUN 8 - 23 mg/dL 16  16  21    Creatinine 0.61 - 1.24 mg/dL 0.82  0.67  0.85   Sodium 135 - 145 mmol/L 140  139  140  Potassium 3.5 - 5.1 mmol/L 4.1  4.1  3.9   Chloride 98 - 111 mmol/L 108  107  109   CO2 22 - 32 mmol/L 29  28  23    Calcium 8.9 - 10.3 mg/dL 9.2  8.7  8.9   Total Protein 6.5 - 8.1 g/dL 7.1  7.6    Total Bilirubin 0.3 - 1.2 mg/dL 0.5  0.6    Alkaline Phos 38 - 126 U/L 75  66    AST 15 - 41 U/L 34  34    ALT 0 - 44 U/L 24  42        RADIOGRAPHIC STUDIES: I have personally reviewed the radiological images as listed and agreed with the findings in the report. CT Head Wo Contrast  Result Date: 06/14/2022 CLINICAL DATA:  Polytrauma, thrombocytopenia EXAM: CT HEAD WITHOUT CONTRAST TECHNIQUE: Contiguous axial images were obtained from the base of the skull through the vertex without intravenous contrast. RADIATION DOSE REDUCTION: This exam was performed according to the departmental dose-optimization program which includes automated exposure control, adjustment of the mA and/or kV according to patient size and/or use of iterative reconstruction technique. COMPARISON:  11/29/2018 FINDINGS: Brain: No evidence of acute infarction, hemorrhage, mass, mass effect, or midline shift. No hydrocephalus or extra-axial fluid collection. Vascular: Contrast is noted within the vasculature prior CT abdomen pelvis. Skull: Negative for fracture or focal lesion. Sinuses/Orbits: No acute finding. Other: The mastoid air cells are well aerated. IMPRESSION: No acute intracranial process. Electronically Signed   By: Merilyn Baba M.D.   On: 06/14/2022 02:39   CT ABDOMEN PELVIS W CONTRAST  Result Date: 06/14/2022 CLINICAL DATA:   Thrombocytopenia.  History of gastric ulcer. EXAM: CT ABDOMEN AND PELVIS WITH CONTRAST TECHNIQUE: Multidetector CT imaging of the abdomen and pelvis was performed using the standard protocol following bolus administration of intravenous contrast. RADIATION DOSE REDUCTION: This exam was performed according to the departmental dose-optimization program which includes automated exposure control, adjustment of the mA and/or kV according to patient size and/or use of iterative reconstruction technique. CONTRAST:  172mL OMNIPAQUE IOHEXOL 300 MG/ML  SOLN COMPARISON:  12/19/2018. FINDINGS: Lower chest: Coronary artery calcifications are noted. Hepatobiliary: There is a vague hypodensity in the left lobe of the liver measuring 2.3 x 1.2 cm, previously characterized as a hemangioma. No biliary ductal dilatation. Small stones are present in the gallbladder. Pancreas: Unremarkable. No pancreatic ductal dilatation or surrounding inflammatory changes. Spleen: Normal in size without focal abnormality. Adrenals/Urinary Tract: The adrenal glands are within normal limits. The kidneys enhance symmetrically. Renal calculi are present on the right. No hydroureteronephrosis. The bladder is unremarkable. Stomach/Bowel: Stomach is within normal limits. Appendix appears normal. No evidence of bowel wall thickening, distention, or inflammatory changes. No free air or pneumatosis. Scattered diverticula are present along the colon without evidence of diverticulitis. Vascular/Lymphatic: Aortic atherosclerosis. No enlarged abdominal or pelvic lymph nodes. Reproductive: Prostate is unremarkable. Other: No abdominopelvic ascites. Musculoskeletal: Degenerative changes in the thoracolumbar spine. IMPRESSION: 1. No acute intra-abdominal process. 2. Nonobstructive right renal calculi. 3. Aortic atherosclerosis and coronary artery calcifications. Electronically Signed   By: Brett Fairy M.D.   On: 06/14/2022 02:18   US Venous Img Lower Unilateral  Right (DVT)  Result Date: 06/13/2022 CLINICAL DATA:  Right calf pain and swelling. EXAM: RIGHT LOWER EXTREMITY VENOUS DOPPLER ULTRASOUND TECHNIQUE: Gray-scale sonography with graded compression, as well as color Doppler and duplex ultrasound were performed to evaluate the lower extremity deep venous systems from the level of  the common femoral vein and including the common femoral, femoral, profunda femoral, popliteal and calf veins including the posterior tibial, peroneal and gastrocnemius veins when visible. The superficial great saphenous vein was also interrogated. Spectral Doppler was utilized to evaluate flow at rest and with distal augmentation maneuvers in the common femoral, femoral and popliteal veins. COMPARISON:  None Available. FINDINGS: Contralateral Common Femoral Vein: Respiratory phasicity is normal and symmetric with the symptomatic side. No evidence of thrombus. Normal compressibility. Common Femoral Vein: No evidence of thrombus. Normal compressibility, respiratory phasicity and response to augmentation. Saphenofemoral Junction: No evidence of thrombus. Normal compressibility and flow on color Doppler imaging. Profunda Femoral Vein: No evidence of thrombus. Normal compressibility and flow on color Doppler imaging. Femoral Vein: No evidence of thrombus. Normal compressibility, respiratory phasicity and response to augmentation. Popliteal Vein: No evidence of thrombus. Normal compressibility, respiratory phasicity and response to augmentation. Calf Veins: The RIGHT posterior tibial vein and right peroneal vein are poorly visualized. Superficial Great Saphenous Vein: No evidence of thrombus. Normal compressibility. Venous Reflux:  None. Other Findings:  None. IMPRESSION: Limited visualization of the RIGHT posterior tibial vein and RIGHT peroneal vein, without evidence of DVT within the RIGHT lower extremity. Electronically Signed   By: Virgina Norfolk M.D.   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 Immature platelet fraction of 37.2% suggesting excellent bone marrow response. Use of high doses of ibuprofen over the last 5 days could also be a concern. Normal TSH B12 and folate levels Monospot test negative All of the patients questions were answered with apparent satisfaction. The patient knows to call the clinic with any problems, questions or concerns. CT abdomen pelvis done today shows no hepatosplenomegaly or signs of chronic liver disease.   #2 right lower extremity cellulitis following injury to the right leg with skin breach.-On ceftriaxone and vancomycin per hospitalist.  #3 hypertension #4 coronary disease status post remote history of PCI x 2 #5 obesity  PLAN: -Discussed lab results from today, 07/10/2022, with the patient. CBC shows slightly decreased hemoglobin of 12.5, decreased hematocrit of 37.5, and decreased platelet of 63. CMP is stable.  -Recommended compression socks to reduce leg swelling.  -Discussed the options of treatment if patient has chronic ITP.  -Patient currently has Acute ITP. -Will prescribe low-dose Prednisone.@ 20mg  po daily with monitoring -- if platelets due not improve or steroids cannot be tapered off then will need other treatment for the ITP such as Promacta vs Rituxan -Recommended to keep the right wound clean.    FOLLOW-UP: Labs in 2 weeks and 4 weeks Phone visit with Dr Irene Limbo after labs in 4 weeks  The total time spent in the appointment was 23 minutes* .  All of the patient's questions were answered with apparent satisfaction. The patient knows to call the clinic with any problems, questions or concerns.   Sullivan Lone MD MS AAHIVMS [facility] Hematology/Oncology [facility]ician [facility]  .*Total Encounter Time as defined by the Centers for Medicare and Medicaid Services includes, in addition to the face-to-face time of a patient visit (documented in the note above) non-face-to-face time: obtaining and reviewing outside history, ordering and reviewing medications, tests or procedures, care coordination (communications with other health care professionals or caregivers) and documentation in  the medical record.   I, Cleda Mccreedy, am acting as a Education administrator for Sullivan Lone, MD. .I have reviewed the above documentation for accuracy and completeness, and I agree with the above. Brunetta Genera MD

## 2022-07-11 ENCOUNTER — Telehealth: Payer: Self-pay | Admitting: Hematology

## 2022-07-11 NOTE — Telephone Encounter (Signed)
Called patient per 3/11 los notes to schedule f/u. Left voicemail with new appointment information and contact details if needing to reschedule.

## 2022-07-20 ENCOUNTER — Other Ambulatory Visit: Payer: Self-pay

## 2022-07-20 DIAGNOSIS — D693 Immune thrombocytopenic purpura: Secondary | ICD-10-CM

## 2022-07-24 ENCOUNTER — Inpatient Hospital Stay

## 2022-07-24 ENCOUNTER — Other Ambulatory Visit: Payer: Self-pay

## 2022-07-24 DIAGNOSIS — D693 Immune thrombocytopenic purpura: Secondary | ICD-10-CM | POA: Diagnosis not present

## 2022-07-24 LAB — CBC WITH DIFFERENTIAL (CANCER CENTER ONLY)
Abs Immature Granulocytes: 0.04 10*3/uL (ref 0.00–0.07)
Basophils Absolute: 0.1 10*3/uL (ref 0.0–0.1)
Basophils Relative: 1 %
Eosinophils Absolute: 0.2 10*3/uL (ref 0.0–0.5)
Eosinophils Relative: 1 %
HCT: 42 % (ref 39.0–52.0)
Hemoglobin: 13.9 g/dL (ref 13.0–17.0)
Immature Granulocytes: 0 %
Lymphocytes Relative: 15 %
Lymphs Abs: 1.7 10*3/uL (ref 0.7–4.0)
MCH: 29.6 pg (ref 26.0–34.0)
MCHC: 33.1 g/dL (ref 30.0–36.0)
MCV: 89.6 fL (ref 80.0–100.0)
Monocytes Absolute: 0.8 10*3/uL (ref 0.1–1.0)
Monocytes Relative: 7 %
Neutro Abs: 8.7 10*3/uL — ABNORMAL HIGH (ref 1.7–7.7)
Neutrophils Relative %: 76 %
Platelet Count: 103 10*3/uL — ABNORMAL LOW (ref 150–400)
RBC: 4.69 MIL/uL (ref 4.22–5.81)
RDW: 13.5 % (ref 11.5–15.5)
WBC Count: 11.5 10*3/uL — ABNORMAL HIGH (ref 4.0–10.5)
nRBC: 0 % (ref 0.0–0.2)

## 2022-07-24 LAB — CMP (CANCER CENTER ONLY)
ALT: 21 U/L (ref 0–44)
AST: 27 U/L (ref 15–41)
Albumin: 3.9 g/dL (ref 3.5–5.0)
Alkaline Phosphatase: 68 U/L (ref 38–126)
Anion gap: 7 (ref 5–15)
BUN: 20 mg/dL (ref 8–23)
CO2: 27 mmol/L (ref 22–32)
Calcium: 9.1 mg/dL (ref 8.9–10.3)
Chloride: 108 mmol/L (ref 98–111)
Creatinine: 1.01 mg/dL (ref 0.61–1.24)
GFR, Estimated: >60 mL/min (ref >60)
Glucose, Bld: 89 mg/dL (ref 70–99)
Potassium: 4.5 mmol/L (ref 3.5–5.1)
Sodium: 142 mmol/L (ref 135–145)
Total Bilirubin: 0.4 mg/dL (ref 0.3–1.2)
Total Protein: 6.7 g/dL (ref 6.5–8.1)

## 2022-07-24 LAB — IMMATURE PLATELET FRACTION: Immature Platelet Fraction: 12.1 % — ABNORMAL HIGH (ref 1.2–8.6)

## 2022-08-07 ENCOUNTER — Inpatient Hospital Stay: Attending: Hematology

## 2022-08-07 ENCOUNTER — Other Ambulatory Visit: Payer: Self-pay

## 2022-08-07 DIAGNOSIS — D693 Immune thrombocytopenic purpura: Secondary | ICD-10-CM | POA: Diagnosis present

## 2022-08-07 LAB — CMP (CANCER CENTER ONLY)
ALT: 21 U/L (ref 0–44)
AST: 27 U/L (ref 15–41)
Albumin: 4 g/dL (ref 3.5–5.0)
Alkaline Phosphatase: 55 U/L (ref 38–126)
Anion gap: 4 — ABNORMAL LOW (ref 5–15)
BUN: 19 mg/dL (ref 8–23)
CO2: 30 mmol/L (ref 22–32)
Calcium: 9.5 mg/dL (ref 8.9–10.3)
Chloride: 106 mmol/L (ref 98–111)
Creatinine: 0.98 mg/dL (ref 0.61–1.24)
GFR, Estimated: >60 mL/min (ref >60)
Glucose, Bld: 101 mg/dL — ABNORMAL HIGH (ref 70–99)
Potassium: 5.6 mmol/L — ABNORMAL HIGH (ref 3.5–5.1)
Sodium: 140 mmol/L (ref 135–145)
Total Bilirubin: 0.6 mg/dL (ref 0.3–1.2)
Total Protein: 6.8 g/dL (ref 6.5–8.1)

## 2022-08-07 LAB — CBC WITH DIFFERENTIAL (CANCER CENTER ONLY)
Abs Immature Granulocytes: 0.03 10*3/uL (ref 0.00–0.07)
Basophils Absolute: 0 10*3/uL (ref 0.0–0.1)
Basophils Relative: 0 %
Eosinophils Absolute: 0.1 10*3/uL (ref 0.0–0.5)
Eosinophils Relative: 1 %
HCT: 42.6 % (ref 39.0–52.0)
Hemoglobin: 14.1 g/dL (ref 13.0–17.0)
Immature Granulocytes: 0 %
Lymphocytes Relative: 11 %
Lymphs Abs: 1 10*3/uL (ref 0.7–4.0)
MCH: 29.7 pg (ref 26.0–34.0)
MCHC: 33.1 g/dL (ref 30.0–36.0)
MCV: 89.7 fL (ref 80.0–100.0)
Monocytes Absolute: 0.3 10*3/uL (ref 0.1–1.0)
Monocytes Relative: 3 %
Neutro Abs: 8.4 10*3/uL — ABNORMAL HIGH (ref 1.7–7.7)
Neutrophils Relative %: 85 %
Platelet Count: 35 10*3/uL — ABNORMAL LOW (ref 150–400)
RBC: 4.75 MIL/uL (ref 4.22–5.81)
RDW: 13.7 % (ref 11.5–15.5)
WBC Count: 9.8 10*3/uL (ref 4.0–10.5)
nRBC: 0 % (ref 0.0–0.2)

## 2022-08-07 LAB — IMMATURE PLATELET FRACTION: Immature Platelet Fraction: 18.7 % — ABNORMAL HIGH (ref 1.2–8.6)

## 2022-08-20 ENCOUNTER — Emergency Department (HOSPITAL_BASED_OUTPATIENT_CLINIC_OR_DEPARTMENT_OTHER)

## 2022-08-20 ENCOUNTER — Inpatient Hospital Stay (HOSPITAL_BASED_OUTPATIENT_CLINIC_OR_DEPARTMENT_OTHER)
Admission: EM | Admit: 2022-08-20 | Discharge: 2022-08-23 | DRG: 603 | Disposition: A | Discharge status: Home / Self Care | Attending: Internal Medicine | Admitting: Internal Medicine

## 2022-08-20 ENCOUNTER — Encounter (HOSPITAL_BASED_OUTPATIENT_CLINIC_OR_DEPARTMENT_OTHER): Payer: Self-pay | Admitting: Emergency Medicine

## 2022-08-20 ENCOUNTER — Other Ambulatory Visit: Payer: Self-pay

## 2022-08-20 ENCOUNTER — Other Ambulatory Visit: Payer: Self-pay | Admitting: Hematology and Oncology

## 2022-08-20 DIAGNOSIS — E7800 Pure hypercholesterolemia, unspecified: Secondary | ICD-10-CM | POA: Diagnosis present

## 2022-08-20 DIAGNOSIS — I160 Hypertensive urgency: Secondary | ICD-10-CM | POA: Diagnosis present

## 2022-08-20 DIAGNOSIS — I2510 Atherosclerotic heart disease of native coronary artery without angina pectoris: Secondary | ICD-10-CM | POA: Diagnosis present

## 2022-08-20 DIAGNOSIS — Z6838 Body mass index (BMI) 38.0-38.9, adult: Secondary | ICD-10-CM

## 2022-08-20 DIAGNOSIS — Z825 Family history of asthma and other chronic lower respiratory diseases: Secondary | ICD-10-CM

## 2022-08-20 DIAGNOSIS — Z833 Family history of diabetes mellitus: Secondary | ICD-10-CM | POA: Diagnosis not present

## 2022-08-20 DIAGNOSIS — Z8711 Personal history of peptic ulcer disease: Secondary | ICD-10-CM | POA: Diagnosis not present

## 2022-08-20 DIAGNOSIS — Z8249 Family history of ischemic heart disease and other diseases of the circulatory system: Secondary | ICD-10-CM | POA: Diagnosis not present

## 2022-08-20 DIAGNOSIS — Z7982 Long term (current) use of aspirin: Secondary | ICD-10-CM

## 2022-08-20 DIAGNOSIS — D72828 Other elevated white blood cell count: Secondary | ICD-10-CM | POA: Diagnosis not present

## 2022-08-20 DIAGNOSIS — Z79899 Other long term (current) drug therapy: Secondary | ICD-10-CM

## 2022-08-20 DIAGNOSIS — Z955 Presence of coronary angioplasty implant and graft: Secondary | ICD-10-CM | POA: Diagnosis not present

## 2022-08-20 DIAGNOSIS — D693 Immune thrombocytopenic purpura: Secondary | ICD-10-CM | POA: Diagnosis present

## 2022-08-20 DIAGNOSIS — I252 Old myocardial infarction: Secondary | ICD-10-CM | POA: Diagnosis not present

## 2022-08-20 DIAGNOSIS — Z880 Allergy status to penicillin: Secondary | ICD-10-CM

## 2022-08-20 DIAGNOSIS — Z800 Family history of malignant neoplasm of digestive organs: Secondary | ICD-10-CM

## 2022-08-20 DIAGNOSIS — E669 Obesity, unspecified: Secondary | ICD-10-CM | POA: Diagnosis present

## 2022-08-20 DIAGNOSIS — T380X5A Adverse effect of glucocorticoids and synthetic analogues, initial encounter: Secondary | ICD-10-CM | POA: Diagnosis not present

## 2022-08-20 DIAGNOSIS — I10 Essential (primary) hypertension: Secondary | ICD-10-CM | POA: Diagnosis present

## 2022-08-20 DIAGNOSIS — L03115 Cellulitis of right lower limb: Principal | ICD-10-CM | POA: Diagnosis present

## 2022-08-20 LAB — COMPREHENSIVE METABOLIC PANEL
ALT: 20 U/L (ref 0–44)
AST: 30 U/L (ref 15–41)
Albumin: 3.3 g/dL — ABNORMAL LOW (ref 3.5–5.0)
Alkaline Phosphatase: 63 U/L (ref 38–126)
Anion gap: 6 (ref 5–15)
BUN: 15 mg/dL (ref 8–23)
CO2: 22 mmol/L (ref 22–32)
Calcium: 8.3 mg/dL — ABNORMAL LOW (ref 8.9–10.3)
Chloride: 108 mmol/L (ref 98–111)
Creatinine, Ser: 0.76 mg/dL (ref 0.61–1.24)
GFR, Estimated: >60 mL/min (ref >60)
Glucose, Bld: 96 mg/dL (ref 70–99)
Potassium: 4.1 mmol/L (ref 3.5–5.1)
Sodium: 136 mmol/L (ref 135–145)
Total Bilirubin: 0.5 mg/dL (ref 0.3–1.2)
Total Protein: 6.4 g/dL — ABNORMAL LOW (ref 6.5–8.1)

## 2022-08-20 LAB — TYPE AND SCREEN
ABO/RH(D): A POS
Antibody Screen: NEGATIVE

## 2022-08-20 LAB — CBC WITH DIFFERENTIAL/PLATELET
Abs Immature Granulocytes: 0.01 10*3/uL (ref 0.00–0.07)
Basophils Absolute: 0.1 10*3/uL (ref 0.0–0.1)
Basophils Relative: 1 %
Eosinophils Absolute: 0.4 10*3/uL (ref 0.0–0.5)
Eosinophils Relative: 5 %
HCT: 39.8 % (ref 39.0–52.0)
Hemoglobin: 13.2 g/dL (ref 13.0–17.0)
Immature Granulocytes: 0 %
Lymphocytes Relative: 21 %
Lymphs Abs: 1.5 10*3/uL (ref 0.7–4.0)
MCH: 29 pg (ref 26.0–34.0)
MCHC: 33.2 g/dL (ref 30.0–36.0)
MCV: 87.5 fL (ref 80.0–100.0)
Monocytes Absolute: 0.6 10*3/uL (ref 0.1–1.0)
Monocytes Relative: 8 %
Neutro Abs: 4.6 10*3/uL (ref 1.7–7.7)
Neutrophils Relative %: 65 %
Platelets: 5 10*3/uL — CL (ref 150–400)
RBC: 4.55 MIL/uL (ref 4.22–5.81)
RDW: 13.7 % (ref 11.5–15.5)
Smear Review: NORMAL
WBC: 7.1 10*3/uL (ref 4.0–10.5)
nRBC: 0 % (ref 0.0–0.2)

## 2022-08-20 LAB — MAGNESIUM: Magnesium: 2.4 mg/dL (ref 1.7–2.4)

## 2022-08-20 LAB — BPAM PLATELET PHERESIS: ISSUE DATE / TIME: 202404212028

## 2022-08-20 LAB — PREPARE PLATELET PHERESIS: Unit division: 0

## 2022-08-20 LAB — LACTIC ACID, PLASMA: Lactic Acid, Venous: 0.8 mmol/L (ref 0.5–1.9)

## 2022-08-20 LAB — TSH: TSH: 0.539 u[IU]/mL (ref 0.350–4.500)

## 2022-08-20 MED ORDER — ACETAMINOPHEN 500 MG PO TABS
500.0000 mg | ORAL_TABLET | Freq: Three times a day (TID) | ORAL | Status: DC | PRN
  Administered 2022-08-22: 500 mg via ORAL
  Filled 2022-08-20: qty 1

## 2022-08-20 MED ORDER — VANCOMYCIN HCL IN DEXTROSE 1-5 GM/200ML-% IV SOLN
1000.0000 mg | Freq: Once | INTRAVENOUS | Status: AC
  Administered 2022-08-20: 1000 mg via INTRAVENOUS
  Filled 2022-08-20: qty 200

## 2022-08-20 MED ORDER — LISINOPRIL 10 MG PO TABS
10.0000 mg | ORAL_TABLET | Freq: Every day | ORAL | Status: DC
  Administered 2022-08-21 – 2022-08-23 (×3): 10 mg via ORAL
  Filled 2022-08-20 (×3): qty 1

## 2022-08-20 MED ORDER — SODIUM CHLORIDE 0.9 % IV BOLUS: 1000.0000 mL | Freq: Once | INTRAVENOUS | Status: DC

## 2022-08-20 MED ORDER — SODIUM CHLORIDE 0.9% IV SOLUTION: Freq: Once | INTRAVENOUS | Status: DC

## 2022-08-20 MED ORDER — VANCOMYCIN HCL 1250 MG/250ML IV SOLN
1250.0000 mg | Freq: Once | INTRAVENOUS | Status: AC
  Administered 2022-08-20: 1250 mg via INTRAVENOUS
  Filled 2022-08-20: qty 250

## 2022-08-20 MED ORDER — SODIUM CHLORIDE 0.9% FLUSH
3.0000 mL | Freq: Two times a day (BID) | INTRAVENOUS | Status: DC
  Administered 2022-08-21 – 2022-08-23 (×4): 3 mL via INTRAVENOUS

## 2022-08-20 MED ORDER — ONDANSETRON HCL 4 MG/2ML IJ SOLN: 4.0000 mg | Freq: Four times a day (QID) | INTRAMUSCULAR | Status: DC | PRN

## 2022-08-20 MED ORDER — SILVER SULFADIAZINE 1 % EX CREA: TOPICAL_CREAM | CUTANEOUS | Status: DC

## 2022-08-20 MED ORDER — VANCOMYCIN HCL 1250 MG/250ML IV SOLN
1250.0000 mg | Freq: Two times a day (BID) | INTRAVENOUS | Status: DC
  Administered 2022-08-21 – 2022-08-23 (×5): 1250 mg via INTRAVENOUS
  Filled 2022-08-20 (×6): qty 250

## 2022-08-20 MED ORDER — HYDRALAZINE HCL 20 MG/ML IJ SOLN: 10.0000 mg | INTRAMUSCULAR | Status: DC | PRN

## 2022-08-20 MED ORDER — DEXAMETHASONE 4 MG PO TABS
40.0000 mg | ORAL_TABLET | Freq: Once | ORAL | Status: AC
  Administered 2022-08-20: 40 mg via ORAL
  Filled 2022-08-20: qty 10

## 2022-08-20 MED ORDER — CARVEDILOL 12.5 MG PO TABS
12.5000 mg | ORAL_TABLET | Freq: Two times a day (BID) | ORAL | Status: DC
  Administered 2022-08-20 – 2022-08-23 (×6): 12.5 mg via ORAL
  Filled 2022-08-20 (×6): qty 1

## 2022-08-20 MED ORDER — ATORVASTATIN CALCIUM 40 MG PO TABS
80.0000 mg | ORAL_TABLET | Freq: Every day | ORAL | Status: DC
  Filled 2022-08-20: qty 2

## 2022-08-20 MED ORDER — DEXAMETHASONE 4 MG PO TABS
40.0000 mg | ORAL_TABLET | Freq: Every day | ORAL | Status: DC
  Administered 2022-08-21 – 2022-08-23 (×3): 40 mg via ORAL
  Filled 2022-08-20 (×4): qty 10

## 2022-08-20 MED ORDER — SODIUM CHLORIDE 0.9 % IV SOLN
1.0000 g | Freq: Once | INTRAVENOUS | Status: AC
  Administered 2022-08-20: 1 g via INTRAVENOUS
  Filled 2022-08-20: qty 10

## 2022-08-20 MED ORDER — ONDANSETRON HCL 4 MG PO TABS: 4.0000 mg | ORAL_TABLET | Freq: Four times a day (QID) | ORAL | Status: DC | PRN

## 2022-08-20 NOTE — ED Notes (Signed)
[facility] at bedside 

## 2022-08-20 NOTE — Consult Note (Signed)
WOC Nurse Consult Note: Reason for Consult:RLE full thickness wound Wound type:trauma Pressure Injury POA: N/A Measurement:Bedside RN to measure and document measurements on Nursing Flow Sheet with application of next dressing Wound bed:See also photodocumentation of wound in EMR Drainage (amount, consistency, odor) small serous Periwound:erythematous, edematous Dressing procedure/placement/frequency: I will implement a conservative POC using a daily cleanse followed by application of silver sulfadiazine (silvadene cream). This is to be topped with a saline moistened gauze dressing, and ABD pad and secured by wrapping from just below toes to knee and topping the Kerlix roll gauze with ACE bandage.  A referral to the outpatient wound care center is recommended.  LE is to be elevated while in bed or chair.   A sacral foam is to be placed for PI prevention.  WOC nursing team will not follow, but will remain available to this patient, the nursing and medical teams.  Please re-consult if needed.  Thank you for inviting [place] to participate in this patient's Plan of Care.  Ladona Mow, MSN, RN, CNS, GNP, Leda Min, [employer], [employer] phone:  [phone]

## 2022-08-20 NOTE — Progress Notes (Signed)
Pharmacy Antibiotic Note  Joel Hoffman is a 62 y.o. male admitted on 08/20/2022 with cellulitis.  Pharmacy has been consulted for vanc dosing.  Plan: Vanc 1g x 1 around 1600. Start vanc  IV q12, gotal AUC 400-550 Rocephin x 1 already given. Recommend continuing for adequate gram negative coverage for cellulitis  Weight: 124.7 kg (275 lb)  Temp (24hrs), Avg:98.5 F (36.9 C), Min:98.1 F (36.7 C), Max:98.7 F (37.1 C)  Recent Labs  Lab 08/20/22 1429  WBC 7.1  CREATININE 0.76  LATICACIDVEN 0.8    Estimated Creatinine Clearance: 127.2 mL/min (by C-G formula based on SCr of 0.76 mg/dL).    Allergies  Allergen Reactions   Ampicillin Rash      Thank you for allowing pharmacy to be a part of this patient's care.  Berkley Harvey 08/20/2022 6:22 PM

## 2022-08-20 NOTE — ED Provider Notes (Signed)
[facility] Provider Note   CSN: [id number] Arrival date & time: 08/20/22  1301     History  Chief Complaint  Patient presents with   Leg Injury    Joel Hoffman is a 62 y.o. male.  Patient with a history of hypertension, high cholesterol, CAD with stents, previous thrombocytopenia and ITP presenting with leg wound.  States he developed a cut on his leg in January and required hospitalization last February for severe infection coupled with ITP.  States like that he will eventually but over the past week he has developed a wound again in the same location.  States he has bumped his leg multiple times since his discharge from the hospital in February. Has had a draining wound to his right shin with surrounding erythema, pain and burning sensation.  Comes in today because the redness is spreading and is having more clear drainage.  Denies fever, chills, nausea or vomiting.  No abdominal pain.  No chest pain or shortness of breath.  No focal weakness, numbness or tingling.  No blood thinner use.  No history of diabetes.  The history is provided by the patient.       Home Medications Prior to Admission medications   Medication Sig Start Date End Date Taking? Authorizing Provider  aspirin EC 81 MG tablet Take 81 mg by mouth daily. Swallow whole.   Yes [provider]  atorvastatin (LIPITOR) 80 MG tablet Take 80 mg by mouth daily. 06/05/22  Yes [provider]  carvedilol (COREG) 12.5 MG tablet Take 12.5 mg by mouth 2 (two) times daily. 07/18/21  Yes [provider]  lisinopril (ZESTRIL) 10 MG tablet Take 10 mg by mouth daily. 05/30/22  Yes [provider]  acetaminophen (TYLENOL) 500 MG tablet Take 500 mg by mouth in the morning and at bedtime.    [provider]  Multiple Vitamins-Minerals (CENTRUM SILVER 50+MEN PO) Take 1 tablet by mouth daily.    [provider]  Polyethyl Glycol-Propyl  Glycol (SYSTANE OP) Place 2 drops into the right eye in the morning and at bedtime.    [provider]      Allergies    Ampicillin    Review of Systems   Review of Systems  Constitutional:  Negative for activity change, appetite change and fever.  HENT:  Negative for congestion and rhinorrhea.   Respiratory:  Negative for cough, chest tightness and shortness of breath.   Cardiovascular:  Negative for chest pain.  Gastrointestinal:  Negative for abdominal pain, nausea and vomiting.  Genitourinary:  Negative for dysuria and hematuria.  Musculoskeletal:  Negative for arthralgias and myalgias.  Skin:  Positive for rash and wound.  Neurological:  Negative for dizziness, weakness and headaches.   all other systems are negative except as noted in the HPI and PMH.    Physical Exam Updated Vital Signs BP (!) 200/107 (BP Location: Left Arm)   Pulse 72   Temp 98.1 F (36.7 C) (Oral)   Resp 18   Wt 124.7 kg   SpO2 100%   BMI 37.30 kg/m  Physical Exam Vitals and nursing note reviewed.  Constitutional:      General: He is not in acute distress.    Appearance: He is well-developed.  HENT:     Head: Normocephalic and atraumatic.     Mouth/Throat:     Pharynx: No oropharyngeal exudate.  Eyes:     Conjunctiva/sclera: Conjunctivae normal.  Pupils: Pupils are equal, round, and reactive to light.  Neck:     Comments: No meningismus. Cardiovascular:     Rate and Rhythm: Normal rate and regular rhythm.     Heart sounds: Normal heart sounds. No murmur heard. Pulmonary:     Effort: Pulmonary effort is normal. No respiratory distress.     Breath sounds: Normal breath sounds.  Abdominal:     Palpations: Abdomen is soft.     Tenderness: There is no abdominal tenderness. There is no guarding or rebound.  Musculoskeletal:        General: Swelling, tenderness and signs of injury present. Normal range of motion.     Cervical back: Normal range of motion and neck supple.      Comments: Draining wound to right shin as depicted, clear drainage, no crepitus.  Intact DP and PT pulse.  Skin:    General: Skin is warm.  Neurological:     Mental Status: He is alert and oriented to person, place, and time.     Cranial Nerves: No cranial nerve deficit.     Motor: No abnormal muscle tone.     Coordination: Coordination normal.     Comments:  5/5 strength throughout. CN 2-12 intact.Equal grip strength.   Psychiatric:        Behavior: Behavior normal.     ED Results / Procedures / Treatments   Labs (all labs ordered are listed, but only abnormal results are displayed) Labs Reviewed  COMPREHENSIVE METABOLIC PANEL - Abnormal; Notable for the following components:      Result Value   Calcium 8.3 (*)    Total Protein 6.4 (*)    Albumin 3.3 (*)    All other components within normal limits  CULTURE, BLOOD (ROUTINE X 2)  CULTURE, BLOOD (ROUTINE X 2)  LACTIC ACID, PLASMA  CBC WITH DIFFERENTIAL/PLATELET  LACTIC ACID, PLASMA    EKG None  Radiology DG Tibia/Fibula Right  Result Date: 08/20/2022 CLINICAL DATA:  Leg wound. EXAM: RIGHT TIBIA AND FIBULA - 2 VIEW COMPARISON:  None Available. FINDINGS: No fracture or dislocation. Preserved bone mineralization. There are degenerative changes of the knee particularly involving the medial compartment. There is sclerosis and loss of roundness to the medial femoral condyle. Please correlate for an osteochondral abnormality. No soft tissue gas identified or bony erosive changes IMPRESSION: No acute osseous abnormality. No definite bony erosive changes or soft tissue gas. If there is further concern of bone infection, bone scan or MRI could be considered as clinically appropriate for further sensitivity. Degenerative changes of the knee joint particularly the medial compartment Electronically Signed   By: Karen Kays M.D.   On: 08/20/2022 14:27    Procedures Procedures    Medications Ordered in ED Medications  cefTRIAXone  (ROCEPHIN) 1 g in sodium chloride 0.9 % 100 mL IVPB (has no administration in time range)  vancomycin (VANCOCIN) IVPB 1000 mg/200 mL premix (has no administration in time range)    ED Course/ Medical Decision Making/ A&P Clinical Course as of 08/20/22 1512  Sun Aug 20, 2022  1509 62 M pmh CAD on ASA, not DM previous RLE cellulitis with recurrent cellulitis in same location as February. Had ITP last admission. Today with return of cellulitis. Nontoxic. Admit after labs.  [VB]    Clinical Course User Index [VB] Mardene Sayer, MD  Medical Decision Making Amount and/or Complexity of Data Reviewed Labs: ordered. Decision-making details documented in ED Course. Radiology: ordered and independent interpretation performed. Decision-making details documented in ED Course. ECG/medicine tests: ordered and independent interpretation performed. Decision-making details documented in ED Course.  Risk Prescription drug management.  Recurrent right shin wound.  Hospitalization for same 2 months ago.  Vital stable, no distress no fever.  Concern for recurrent cellulitis.  Will need to rule out ITP again.  Obtain x-ray, labs Initiate empiric antibiotics.  X-ray obtained.  No evidence of subcutaneous emphysema.  No evidence of bony abnormality.  Results reviewed interpreted by me.  Patient appears well and nontoxic.  No fever.  Will obtain Doppler ultrasound.  Empiric antibiotics begun for suspected cellulitis and recurrent location.  Awaiting CBC to assess for recurrent thrombocytopenia.  Will require admission for IV antibiotics.  Care transferred at shift change to Dr. Elpidio Anis.        Final Clinical Impression(s) / ED Diagnoses Final diagnoses:  None    Rx / DC Orders ED Discharge Orders     None         Anu Stagner, Jeannett Senior, MD 08/20/22 1513

## 2022-08-20 NOTE — ED Notes (Signed)
US at bedside

## 2022-08-20 NOTE — Progress Notes (Unsigned)
Patient not seen. I got a call from [place] [facility] and [facility] ED. 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 He comes in with cellulitis and platelet count of 5K. No active bleeding noted. We discussed about considering dex 40 mg po daily for 4 days, IVIG one dose today and platelet transfusion since plt count less than 10 K and high risk for spontaneous bleeding. He appears to have had IVIG before and tolerated it well. I will convey the message to Dr Candise Che tomorrow who is his primary hematologist.  Joel Hoffman Rayneisha Bouza

## 2022-08-20 NOTE — ED Provider Notes (Signed)
Patient arrived from [facility], history of ITP platelets of 5.  I discussed the case with Dr. Al Pimple, hematology, recommended platelet transfusion.    Melene Plan, DO 08/20/22 2108

## 2022-08-20 NOTE — ED Triage Notes (Signed)
Pt has had issues with R lower leg wound. Initial wound was 2 months ago but also had a fall and had to be admitted for infection. Has hit leg wound a few times since discharge from hospital, and pt reports it now looks infected again. Serous drainage coming from wound.

## 2022-08-20 NOTE — H&P (Signed)
History and Physical    Joel Hoffman ZOX:[id number] DOB: 1960-08-05 DOA: 08/20/2022  PCP: Merleen Milliner, MD  Patient coming from: Home  I have personally briefly reviewed patient's old medical records in [facility]  Chief Complaint: Right lower extremity wound and swelling and pain  HPI: Joel Hoffman is a 62 y.o. male with medical history significant of CAD, hyperlipidemia, essential hypertension, obesity and previous history of ITP presented to ED with complaint of right lower extremity wound, swelling and redness.  Evidently, patient was admitted from 06/13/2022 and was discharged on 06/16/2022 and is reasons for admission was right lower extremity cellulitis as well as severe thrombocytopenia.  At that time, patient had failed outpatient doxycycline for right lower extremity cellulitis.  He received IV antibiotics in the ED and was discharged on cefadroxil after discussion with the ID.  His platelets at that time were 5000, hematology was consulted and patient received Solu-Medrol as well as IVIG and platelet transfusion and at the time of discharge, his platelets were 101.  According to patient, since discharge, he has bumped his right lower extremity at least 5-6 times.  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  He denies any fever, chills, sweating, chest pain, shortness of breath, nausea, vomiting, any problem with urination or bowel movements.  ED Course: Upon arrival to ED, he was hypertensive with blood pressure of 200/107 and yet again he was found to have platelets of 5.  ED physician discussed the case with Dr. Al Pimple who had recommended transfusing platelets and starting on dexamethasone 40 mg p.o. daily for 5 days.  Hospitalist service were initially called for admission and patient was accepted to [facility] however due to the fact that patient needed urgent platelet transfusion and platelets were not available at [facility], after discussing with hematology, EDP  decided to transfer the patient from ED to ED at [facility] long.  Hospitalist service were then called to admit the patient.  Review of Systems: As per HPI otherwise negative.    Past Medical History:  Diagnosis Date   Coronary artery disease    Gastric ulcer    Heart attack    Hypercholesteremia    Hypertension     Past Surgical History:  Procedure Laterality Date   CORONARY ANGIOPLASTY WITH STENT PLACEMENT     TONSILLECTOMY       reports that he has never smoked. He has never used smokeless tobacco. He reports that he does not drink alcohol and does not use drugs.  Allergies  Allergen Reactions   Ampicillin Rash    Family History  Problem Relation Age of Onset   Dementia Mother    Cancer Father    Diabetes Father    Emphysema Father    Colon cancer Father    Hypertension Brother     Prior to Admission medications   Medication Sig Start Date End Date Taking? Authorizing Provider  aspirin EC 81 MG tablet Take 81 mg by mouth daily. Swallow whole.   Yes [provider]  atorvastatin (LIPITOR) 80 MG tablet Take 80 mg by mouth daily. 06/05/22  Yes [provider]  carvedilol (COREG) 12.5 MG tablet Take 12.5 mg by mouth 2 (two) times daily. 07/18/21  Yes [provider]  lisinopril (ZESTRIL) 10 MG tablet Take 10 mg by mouth daily. 05/30/22  Yes [provider]  acetaminophen (TYLENOL) 500 MG tablet Take 500 mg by mouth in the morning and at bedtime.    [provider]  Multiple Vitamins-Minerals (CENTRUM SILVER 50+MEN PO) Take 1 tablet by mouth daily.    [provider]  Polyethyl Glycol-Propyl Glycol (SYSTANE OP) Place 2 drops into the right eye in the morning and at bedtime.    [provider]    Physical Exam: Vitals:   08/20/22 1545 08/20/22 1700 08/20/22 1707 08/20/22 1742  BP: 123/82 (!) 160/99  (!) 151/140  Pulse: 65 69  66  Resp:  16  16  Temp:   98.7 F (37.1 C) 98.7 F (37.1 C)  TempSrc:   Oral Oral   SpO2: 97% 98%  100%  Weight:        Constitutional: NAD, calm, comfortable Vitals:   08/20/22 1545 08/20/22 1700 08/20/22 1707 08/20/22 1742  BP: 123/82 (!) 160/99  (!) 151/140  Pulse: 65 69  66  Resp:  16  16  Temp:   98.7 F (37.1 C) 98.7 F (37.1 C)  TempSrc:   Oral Oral  SpO2: 97% 98%  100%  Weight:       Eyes: PERRL, lids and conjunctivae normal ENMT: Mucous membranes are moist. Posterior pharynx clear of any exudate or lesions.Normal dentition.  Neck: normal, supple, no masses, no thyromegaly Respiratory: clear to auscultation bilaterally, no wheezing, no crackles. Normal respiratory effort. No accessory muscle use.  Cardiovascular: Regular rate and rhythm, no murmurs / rubs / gallops. No extremity edema. 2+ pedal pulses. No carotid bruits.  Abdomen: no tenderness, no masses palpated. No hepatosplenomegaly. Bowel sounds positive.  Musculoskeletal: no clubbing / cyanosis. No joint deformity upper and lower extremities. Good ROM, no contractures. Normal muscle tone.  Skin: Partially open wound with some purulent drainage from mid shin in the right lower extremity with surrounding erythema.  +2 pitting edema right lower extremity. Neurologic: CN 2-12 grossly intact. Sensation intact, DTR normal. Strength 5/5 in all 4.  Psychiatric: Normal judgment and insight. Alert and oriented x 3. Normal mood.    Labs on Admission: I have personally reviewed following labs and imaging studies  CBC: Recent Labs  Lab 08/20/22 1429  WBC 7.1  NEUTROABS 4.6  HGB 13.2  HCT 39.8  MCV 87.5  PLT 5*   Basic Metabolic Panel: Recent Labs  Lab 08/20/22 1429  NA 136  K 4.1  CL 108  CO2 22  GLUCOSE 96  BUN 15  CREATININE 0.76  CALCIUM 8.3*   GFR: Estimated Creatinine Clearance: 127.2 mL/min (by C-G formula based on SCr of 0.76 mg/dL). Liver Function Tests: Recent Labs  Lab 08/20/22 1429  AST 30  ALT 20  ALKPHOS 63  BILITOT 0.5  PROT 6.4*  ALBUMIN 3.3*   No results for  input(s): "LIPASE", "AMYLASE" in the last 168 hours. No results for input(s): "AMMONIA" in the last 168 hours. Coagulation Profile: No results for input(s): "INR", "PROTIME" in the last 168 hours. Cardiac Enzymes: No results for input(s): "CKTOTAL", "CKMB", "CKMBINDEX", "TROPONINI" in the last 168 hours. BNP (last 3 results) No results for input(s): "PROBNP" in the last 8760 hours. HbA1C: No results for input(s): "HGBA1C" in the last 72 hours. CBG: No results for input(s): "GLUCAP" in the last 168 hours. Lipid Profile: No results for input(s): "CHOL", "HDL", "LDLCALC", "TRIG", "CHOLHDL", "LDLDIRECT" in the last 72 hours. Thyroid Function Tests: No results for input(s): "TSH", "T4TOTAL", "FREET4", "T3FREE", "THYROIDAB" in the last 72 hours. Anemia Panel: No results for input(s): "VITAMINB12", "FOLATE", "FERRITIN", "TIBC", "IRON", "RETICCTPCT" in the last 72 hours. Urine analysis: No results found for: "COLORURINE", "  APPEARANCEUR", "LABSPEC", "PHURINE", "GLUCOSEU", "HGBUR", "BILIRUBINUR", "KETONESUR", "PROTEINUR", "UROBILINOGEN", "NITRITE", "LEUKOCYTESUR"  Radiological Exams on Admission: US Venous Img Lower Unilateral Right  Result Date: 08/20/2022 CLINICAL DATA:  Leg swelling. EXAM: RIGHT LOWER EXTREMITY VENOUS DOPPLER ULTRASOUND TECHNIQUE: Gray-scale sonography with compression, as well as color and duplex ultrasound, were performed to evaluate the deep venous system(s) from the level of the common femoral vein through the popliteal and proximal calf veins. COMPARISON:  Duplex 06/13/2022 FINDINGS: VENOUS Normal compressibility of the common femoral, superficial femoral, and popliteal veins, as well as the visualized calf veins. Visualized portions of profunda femoral vein and great saphenous vein unremarkable. No filling defects to suggest DVT on grayscale or color Doppler imaging. Doppler waveforms show normal direction of venous flow, normal respiratory plasticity and response to  augmentation. Limited views of the contralateral common femoral vein are unremarkable. OTHER None. Limitations: none IMPRESSION: No evidence of right lower extremity DVT. Electronically Signed   By: Narda Rutherford M.D.   On: 08/20/2022 15:16   DG Tibia/Fibula Right  Result Date: 08/20/2022 CLINICAL DATA:  Leg wound. EXAM: RIGHT TIBIA AND FIBULA - 2 VIEW COMPARISON:  None Available. FINDINGS: No fracture or dislocation. Preserved bone mineralization. There are degenerative changes of the knee particularly involving the medial compartment. There is sclerosis and loss of roundness to the medial femoral condyle. Please correlate for an osteochondral abnormality. No soft tissue gas identified or bony erosive changes IMPRESSION: No acute osseous abnormality. No definite bony erosive changes or soft tissue gas. If there is further concern of bone infection, bone scan or MRI could be considered as clinically appropriate for further sensitivity. Degenerative changes of the knee joint particularly the medial compartment Electronically Signed   By: Karen Kays M.D.   On: 08/20/2022 14:27    EKG: Not done in the ED.  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  Cellulitis order set used.  Will start on vancomycin.  Follow clinical course.  Patient already had Doppler lower extremity done in the ED and was ruled out of DVT.  Will consult wound care.  Recurrent ITP: Platelets 5.  Platelet transfusion to be ordered by ED physician, hematology consulted.  Patient received dexamethasone 40 mg in the ED.  I will order 4 more doses to complete 5-day course.  Defer to hematology for IVIG.  Hyperlipidemia: Resume atorvastatin.  History of CAD: Patient tells me that he had cardiac cath done  and stents placed 4 years ago and at that time he was started on Eliquis as well as aspirin and that currently he is only on aspirin.  Due to severe thrombocytopenia, I will hold aspirin for now.  Reassess tomorrow morning.  Hypertensive urgency: Patient meets criteria for hypertensive urgency, blood pressure has improved already.  I will resume his home dose of lisinopril and monitor.  Placed on as needed hydralazine.  DVT prophylaxis: SCDs Start: 08/20/22 1813 Code Status: Full code Family Communication: None present at bedside.  Plan of care discussed with patient in length and he verbalized understanding and agreed with it. Disposition Plan: Will need hospitalization at least for 3 to 4 days. Consults called: Hematology  Hughie Closs MD [facility]  *Please note that this is a verbal dictation therefore any spelling or grammatical errors are  due to the [employer]" system interpretation.  Please page via Amion and do not message via secure chat for urgent patient care matters. Secure chat can be used for non urgent patient care matters. 08/20/2022, 6:14 PM  To contact the attending provider between 7A-7P or the covering provider during after hours 7P-7A, please log into the web site [email or web address]

## 2022-08-20 NOTE — ED Provider Notes (Addendum)
Clinical Course as of 08/20/22 1654  Sun Aug 20, 2022  1509 62 M pmh CAD on ASA, not DM previous RLE cellulitis with recurrent cellulitis in same location as February. Had ITP last admission. Today with return of cellulitis. Nontoxic. Admit after labs.  [VB]  1622 Dr Al Pimple recommending ED to ED transfer as he should get platelets and we have no platelets available at [facility].  Dr. Adela Lank at [facility] has accepted for ED to ED transfer.  On arrival, platelets can be ordered and hospitalist consulted for admission.  Dr. Al Pimple should be alerted. [VB]    Clinical Course User Index [VB] Mardene Sayer, MD   62 year old male nonseptic with cellulitis of his right leg.  Getting broad-spectrum antibiotics.  Received 40 mg oral Decadron here.  Going ED to ED to [facility] for admission and IV platelet transfusion, admission and possible IVIG. Platelets 5 today   Mardene Sayer, MD 08/20/22 1654    Mardene Sayer, MD 08/20/22 1655    Mardene Sayer, MD 08/20/22 1655

## 2022-08-20 NOTE — Plan of Care (Signed)
This is a 62 year old gentleman with history of hyperlipidemia, CAD with stents, hypertension, previous thrombocytopenia secondary to ITP as well as previous right lower extremity cellulitis who was admitted and discharged in February with a similar things presented back to the emergency department at [facility] with a complaint of pain and worsening wound as well as redness of the right lower extremity.  According to the patient, since he has been discharged, he has bumped his leg several times and now he has developed erythema.  Upon arrival to ED, he was fairly hemodynamically stable however his platelets were found to be only 5.  EDP discussed with on-call hematologist Dr. Al Pimple who recommended starting patient on dexamethasone 40 mg p.o. daily for 5 days and to admit the patient at [facility] hospital for consideration of IVIG.  I also personally spoke to her and verified the plan.  She also recommended to the EDP to transfuse 1 unit of platelets.  When I spoke to the EDP, I was informed that there is no platelet available at [facility] emergency department.  I requested EDP to discuss with hematologist to find out the urgency of platelet transfusion and further patient needs to be transfer ED to ED for urgent transfusion or he can wait for several hours until he gets to the [facility] long hospital.  The patient was accepted under hospitalist service for further management of ITP as well as right lower extremity cellulitis.  Patient has received Rocephin in the ED.  EDP to remain responsible for outpatient care until patient gets to the [facility] long hospital and then [facility] to resume care.

## 2022-08-21 ENCOUNTER — Inpatient Hospital Stay: Admitting: Hematology

## 2022-08-21 DIAGNOSIS — D693 Immune thrombocytopenic purpura: Secondary | ICD-10-CM | POA: Diagnosis not present

## 2022-08-21 LAB — BASIC METABOLIC PANEL
Anion gap: 12 (ref 5–15)
BUN: 14 mg/dL (ref 8–23)
CO2: 22 mmol/L (ref 22–32)
Calcium: 8.9 mg/dL (ref 8.9–10.3)
Chloride: 104 mmol/L (ref 98–111)
Creatinine, Ser: 0.79 mg/dL (ref 0.61–1.24)
GFR, Estimated: >60 mL/min (ref >60)
Glucose, Bld: 148 mg/dL — ABNORMAL HIGH (ref 70–99)
Potassium: 3.9 mmol/L (ref 3.5–5.1)
Sodium: 138 mmol/L (ref 135–145)

## 2022-08-21 LAB — PREPARE PLATELET PHERESIS

## 2022-08-21 LAB — CBC
HCT: 40.9 % (ref 39.0–52.0)
Hemoglobin: 13.2 g/dL (ref 13.0–17.0)
MCH: 28.5 pg (ref 26.0–34.0)
MCHC: 32.3 g/dL (ref 30.0–36.0)
MCV: 88.3 fL (ref 80.0–100.0)
Platelets: 15 10*3/uL — CL (ref 150–400)
RBC: 4.63 MIL/uL (ref 4.22–5.81)
RDW: 13.7 % (ref 11.5–15.5)
WBC: 8.5 10*3/uL (ref 4.0–10.5)
nRBC: 0 % (ref 0.0–0.2)

## 2022-08-21 LAB — BPAM PLATELET PHERESIS
Blood Product Expiration Date: 202404222359
Unit Type and Rh: 6200

## 2022-08-21 MED ORDER — ATORVASTATIN CALCIUM 40 MG PO TABS
80.0000 mg | ORAL_TABLET | Freq: Every day | ORAL | Status: DC
  Administered 2022-08-21 – 2022-08-22 (×2): 80 mg via ORAL
  Filled 2022-08-21 (×2): qty 2

## 2022-08-21 MED ORDER — SILVER SULFADIAZINE 1 % EX CREA
TOPICAL_CREAM | Freq: Every day | CUTANEOUS | Status: DC
  Filled 2022-08-21 (×2): qty 50

## 2022-08-21 NOTE — TOC CM/SW Note (Signed)
  Transition of Care [facility]) Screening Note   Patient Details  Name: Joel Hoffman Date of Birth: [birth year]   Transition of Care [facility]) CM/SW Contact:    Howell Rucks, RN Phone Number: 08/21/2022, 3:25 PM    Transition of Care Department [facility]) has reviewed patient and no TOC needs have been identified at this time. We will continue to monitor patient advancement through interdisciplinary progression rounds. If new patient transition needs arise, please place a TOC consult.

## 2022-08-21 NOTE — Progress Notes (Signed)
PROGRESS NOTE  Rober Skeels  ZOX:[id number] DOB: 1961-02-21 DOA: 08/20/2022 PCP: Merleen Milliner, MD   Brief Narrative:  Patient is a 62 year old male with history of coronary artery disease, hyperlipidemia, hypertension, ITP who presented to the emergency department with complaint of right lower extremity wound, swelling, redness.  He was recently admitted in February for right lower extremity cellulitis and thrombocytopenia and was treated with IV antibiotics, discharged on cefadroxil.  Patient was treated with steroid, IVIG and platelet transfusion on that admission for thrombocytopenia.  Report of bumping his right lower extremity multiple times at home, works as a Nutritional therapist.  On presentation he was hypertensive.  Platelet level in the range of 5.  Patient given platelet transfusion and started on dexamethasone.  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  Right lower extremity venous Doppler negative for DVT.  Wound care consulted.  Follow-up blood cultures. X-ray of tibia/fibula did not find any issues problem.  If the wound does not improve in the next 24 hours, we might consider CT of right leg  Recurrent ITP: Platelets of 5000 on presentation.  Admitted for the same in February.  Hematology consulted.  Started on steroids.  Continue to monitor CBC.  Also given platelet transfusion on 4/21.  Platelets 15,000 today  Hyperlipidemia: On Lipitor  History of coronary artery disease: Takes aspirin at home, currently on hold  Hypertension urgency: Currently blood pressure stable.  Continue lisinopril,coreg.  Continue prn medications for severe hypertension  Obesity: BMI 38.9        DVT prophylaxis:SCDs  Start: 08/20/22 1813     Code Status: Full Code  Family Communication: None at the bedside  Patient status:Inpatient  Patient is from :Home  Anticipated discharge WJ:XBJY  Estimated DC date: After improvement in the platelet level   Consultants: Hematology  Procedures:None  Antimicrobials:  Anti-infectives (From admission, onward)    Start     Dose/Rate Route Frequency Ordered Stop   08/21/22 0600  vancomycin (VANCOREADY) IVPB 1250 mg/250 mL        1,250 mg 166.7 mL/hr over 90 Minutes Intravenous Every 12 hours 08/20/22 1824     08/20/22 1815  vancomycin (VANCOREADY) IVPB 1250 mg/250 mL        1,250 mg 166.7 mL/hr over 90 Minutes Intravenous  Once 08/20/22 1814 08/20/22 2018   08/20/22 1415  cefTRIAXone (ROCEPHIN) 1 g in sodium chloride 0.9 % 100 mL IVPB        1 g 200 mL/hr over 30 Minutes Intravenous  Once 08/20/22 1410 08/20/22 1512   08/20/22 1415  vancomycin (VANCOCIN) IVPB 1000 mg/200 mL premix        1,000 mg 200 mL/hr over 60 Minutes Intravenous  Once 08/20/22 1410 08/20/22 1706       Subjective: Patient seen and examined at bedside.  Hemodynamically stable.  Comfortable.  Walking inside the room.  Denies any significant pain on the right leg wound  Objective: Vitals:   08/21/22 0500 08/21/22 0602 08/21/22 0603 08/21/22 0623  BP:  (!) 135/90  (!) 152/98  Pulse:  78 78 91  Resp:  18  18  Temp:  97.6 F (36.4 C)  99 F (37.2 C)  TempSrc:  Oral  Oral  SpO2:  93% 93% 98%  Weight: 130.1 kg   130.1 kg  Height:    6' (1.829 m)    Intake/Output Summary (Last 24 hours) at 08/21/2022 0758 Last data filed at 08/20/2022 2240 Gross per 24 hour  Intake 848.45 ml  Output --  Net 848.45 ml   Filed Weights   08/20/22 1311 08/21/22 0500 08/21/22 0623  Weight: 124.7 kg 130.1 kg 130.1 kg    Examination:  General exam: Overall comfortable, not in distress,obese HEENT: PERRL Respiratory system:  no wheezes or crackles  Cardiovascular system: S1 & S2 heard,  RRR.  Gastrointestinal system: Abdomen is nondistended, soft and nontender. Central nervous system: Alert and oriented Extremities:  no clubbing ,no cyanosis, erythematous wound /edema/ulceration of right leg on the dorsal side Skin: No rashes, no ulcers,no icterus     Data Reviewed: I have personally reviewed following labs and imaging studies  CBC: Recent Labs  Lab 08/20/22 1429  WBC 7.1  NEUTROABS 4.6  HGB 13.2  HCT 39.8  MCV 87.5  PLT 5*   Basic Metabolic Panel: Recent Labs  Lab 08/20/22 1429 08/20/22 1847 08/21/22 0600  NA 136  --  138  K 4.1  --  3.9  CL 108  --  104  CO2 22  --  22  GLUCOSE 96  --  148*  BUN 15  --  14  CREATININE 0.76  --  0.79  CALCIUM 8.3*  --  8.9  MG  --  2.4  --      Recent Results (from the past 240 hour(s))  Blood culture (routine x 2)     Status: None (Preliminary result)   Collection Time: 08/20/22  2:29 PM   Specimen: BLOOD RIGHT FOREARM  Result Value Ref Range Status   Specimen Description   Final    BLOOD RIGHT FOREARM Performed at [facility], [address] [place], [place] [address]    Special Requests   Final    BOTTLES DRAWN AEROBIC AND ANAEROBIC Blood Culture results may not be optimal due to an inadequate volume of blood received in culture bottles Performed at [facility], [address] [place], [place] [address]    Culture   Final    NO GROWTH < 24 HOURS Performed at [facility], [address] [place], [place] [address]    Report Status PENDING  Incomplete  Blood culture (routine x 2)     Status: None (Preliminary result)   Collection Time: 08/20/22  2:37 PM   Specimen: BLOOD  Result Value Ref Range Status   Specimen Description   Final    BLOOD RIGHT ANTECUBITAL Performed at [facility], [address] [place], [place] [address]    Special Requests   Final    BOTTLES DRAWN AEROBIC AND ANAEROBIC Blood Culture results may not be optimal due to an inadequate  volume of blood received in culture bottles Performed at [facility], [address] [place], [place] [address]    Culture   Final    NO GROWTH < 24 HOURS Performed at [facility], [address], [place], [place] [address]    Report Status PENDING  Incomplete     Radiology Studies: US Venous Img Lower Unilateral Right  Result Date: 08/20/2022 CLINICAL DATA:  Leg swelling. EXAM: RIGHT LOWER EXTREMITY VENOUS DOPPLER ULTRASOUND TECHNIQUE: Gray-scale sonography with compression, as well as color and duplex  ultrasound, were performed to evaluate the deep venous system(s) from the level of the common femoral vein through the popliteal and proximal calf veins. COMPARISON:  Duplex 06/13/2022 FINDINGS: VENOUS Normal compressibility of the common femoral, superficial femoral, and popliteal veins, as well as the visualized calf veins. Visualized portions of profunda femoral vein and great saphenous vein unremarkable. No filling defects to suggest DVT on grayscale or color Doppler imaging. Doppler waveforms show normal direction of venous flow, normal respiratory plasticity and response to augmentation. Limited views of the contralateral common femoral vein are unremarkable. OTHER None. Limitations: none IMPRESSION: No evidence of right lower extremity DVT. Electronically Signed   By: Narda Rutherford M.D.   On: 08/20/2022 15:16   DG Tibia/Fibula Right  Result Date: 08/20/2022 CLINICAL DATA:  Leg wound. EXAM: RIGHT TIBIA AND FIBULA - 2 VIEW COMPARISON:  None Available. FINDINGS: No fracture or dislocation. Preserved bone mineralization. There are degenerative changes of the knee particularly involving the medial compartment. There is sclerosis and loss of roundness to the medial femoral condyle. Please correlate for an osteochondral abnormality. No soft tissue gas identified or bony erosive changes IMPRESSION: No acute osseous abnormality. No definite bony erosive changes or soft tissue  gas. If there is further concern of bone infection, bone scan or MRI could be considered as clinically appropriate for further sensitivity. Degenerative changes of the knee joint particularly the medial compartment Electronically Signed   By: Karen Kays M.D.   On: 08/20/2022 14:27    Scheduled Meds:  sodium chloride   Intravenous Once   atorvastatin  80 mg Oral Daily   carvedilol  12.5 mg Oral BID   dexamethasone  40 mg Oral Daily   lisinopril  10 mg Oral Daily   silver sulfADIAZINE   Topical Q24H   sodium chloride flush  3 mL Intravenous Q12H   Continuous Infusions:  vancomycin 1,250 mg (08/21/22 0648)     LOS: 1 day   Burnadette Pop, MD [facility] P4/22/2024, 7:58 AM

## 2022-08-22 DIAGNOSIS — D693 Immune thrombocytopenic purpura: Secondary | ICD-10-CM | POA: Diagnosis not present

## 2022-08-22 LAB — CBC
HCT: 43 % (ref 39.0–52.0)
Hemoglobin: 14 g/dL (ref 13.0–17.0)
MCH: 29 pg (ref 26.0–34.0)
MCHC: 32.6 g/dL (ref 30.0–36.0)
MCV: 89.2 fL (ref 80.0–100.0)
Platelets: 61 10*3/uL — ABNORMAL LOW (ref 150–400)
RBC: 4.82 MIL/uL (ref 4.22–5.81)
RDW: 13.8 % (ref 11.5–15.5)
WBC: 19 10*3/uL — ABNORMAL HIGH (ref 4.0–10.5)
nRBC: 0 % (ref 0.0–0.2)

## 2022-08-22 MED ORDER — LABETALOL HCL 5 MG/ML IV SOLN
10.0000 mg | INTRAVENOUS | Status: DC | PRN
  Administered 2022-08-22: 10 mg via INTRAVENOUS
  Filled 2022-08-22: qty 4

## 2022-08-22 MED ORDER — ACETAMINOPHEN 325 MG PO TABS
650.0000 mg | ORAL_TABLET | Freq: Once | ORAL | Status: AC
  Administered 2022-08-22: 650 mg via ORAL
  Filled 2022-08-22: qty 2

## 2022-08-22 MED ORDER — IMMUNE GLOBULIN (HUMAN) 10 GM/100ML IV SOLN
1.0000 g/kg | Freq: Once | INTRAVENOUS | Status: AC
  Administered 2022-08-22 (×2): 100 g via INTRAVENOUS
  Filled 2022-08-22: qty 1000

## 2022-08-22 MED ORDER — DIPHENHYDRAMINE HCL 25 MG PO CAPS
25.0000 mg | ORAL_CAPSULE | Freq: Once | ORAL | Status: AC
  Administered 2022-08-22: 25 mg via ORAL
  Filled 2022-08-22: qty 1

## 2022-08-22 NOTE — Progress Notes (Signed)
Per Dr. Candise Che via [place] msg, ok to use Privigen for IVIG and use adjusted body weight for the dose (100g).  Dorna Leitz, PharmD, BCPS 08/22/2022 11:00 AM

## 2022-08-22 NOTE — Progress Notes (Signed)
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   Patient has been started on antibiotics per the hospitalist and also on induction dose dexamethasone 40 mg daily. IVIG was started just prior to my evaluation today. Ultrasound of the right lower extremity showed no evidence of DVT. He states that his RLE wound bandage was replaced after sulfadiazine was applied. ACE wrap was then applied to the leg. He states that he was able to elevate his legs for x1.5 hours this morning. He reports great improvement of his leg swelling and pain. Patient did receive platelet transfusion and platelets today have improved to 61K from 15K yesterday. Patient denies any nosebleeds gum bleeds or overt GI bleeding.   Does have some large bruises around areas of trauma on his lower extremities. Denies any fevers chills or night sweats today   OBJECTIVE: NAD  PHYSICAL EXAMINATION: Vitals:   08/22/22 1337 08/22/22 1358 08/22/22 1425 08/22/22 1446  BP: (!) 149/94 (!) 160/88 (!) 163/84 (!) 165/81  Pulse: 84 69 75 69  Resp: Temp: 97.8 F (36.6 C) 98 F (36.7 C) 97.7 F (36.5 C) 97.8 F (36.6 C)  TempSrc: Oral Oral Oral Oral  SpO2: 98% 96% 99% 97%  Weight:      Height:       Filed Weights   08/21/22 0500 08/21/22 0623 08/22/22 0600  Weight: 286 lb 13.1 oz (130.1 kg) 286 lb 13.1 oz (130.1 kg) 284 lb 13.4 oz (129.2 kg)   Body mass index is 38.63 kg/m.  GENERAL:alert, in no acute distress and comfortable NECK: supple, no JVD, thyroid normal size, non-tender, without nodularity LYMPH:  no palpable lymphadenopathy in the cervical, axillary or inguinal LUNGS: clear to auscultation with normal respiratory effort HEART: regular rate & rhythm,  no  murmurs. ABDOMEN: abdomen soft, non-tender, normoactive bowel sounds  Musculoskeletal: Bilateral lower extremity venous stasis changes. Improved  Right lower extremity swelling over the calf PSYCH: alert & oriented x 3 with fluent speech NEURO: no focal motor/sensory deficits  MEDICAL HISTORY:  Past Medical History:  Diagnosis Date   Coronary artery disease    Gastric ulcer    Heart attack    Hypercholesteremia    Hypertension     SURGICAL HISTORY: Past Surgical History:  Procedure Laterality Date   CORONARY ANGIOPLASTY WITH STENT PLACEMENT     TONSILLECTOMY      SOCIAL HISTORY: Social History   Socioeconomic History   Marital status: Single    Spouse name: Not on file   Number of children: Not on file   Years of education: Not on file   Highest education level: Not on file  Occupational History   Occupation: Ecologist: Production assistant, radio FOR SELF EMPLOYED    Comment: Retired from [employer]  Tobacco Use   Smoking status: Never   Smokeless tobacco: Never  Vaping Use   Vaping Use: Never used  Substance and Sexual Activity   Alcohol use: No    Comment: "Quit 24 years ago"   Drug use: No   Sexual activity: Not Currently  Other Topics Concern   Not on file  Social History Narrative   Not on file   Social Determinants of Health   Financial Resource Strain: Not on file  Food Insecurity: No Food Insecurity (08/21/2022)   Hunger Vital Sign    Worried About Running Out of Food in the Last Year: Never true    Ran Out of Food in the Last Year: Never true  Transportation Needs: No Transportation Needs (08/21/2022)   PRAPARE - Administrator, Civil Service (Medical): No    Lack of Transportation (Non-Medical): No  Physical Activity: Not on file  Stress: Not on file  Social Connections: Not on file  Intimate Partner Violence: Not At Risk (08/21/2022)   Humiliation, Afraid, Rape, and Kick questionnaire    Fear of Current or Ex-Partner: No     Emotionally Abused: No    Physically Abused: No    Sexually Abused: No    FAMILY HISTORY: Family History  Problem Relation Age of Onset   Dementia Mother    Cancer Father    Diabetes Father    Emphysema Father    Colon cancer Father    Hypertension Brother     ALLERGIES:  is allergic to ampicillin.  MEDICATIONS:  Scheduled Meds:  sodium chloride   Intravenous Once   atorvastatin  80 mg Oral QHS   carvedilol  12.5 mg Oral BID   dexamethasone  40 mg Oral Daily   lisinopril  10 mg Oral Daily   silver sulfADIAZINE   Topical Daily   sodium chloride flush  3 mL Intravenous Q12H   Continuous Infusions:  vancomycin Stopped (08/22/22 0706)   PRN Meds:.acetaminophen, hydrALAZINE, ondansetron **OR** ondansetron (ZOFRAN) IV  REVIEW OF SYSTEMS:    10 Point review of Systems was done is negative except as noted above.   LABORATORY DATA:  I have reviewed the data as listed    Latest Ref Rng & Units 08/22/2022    6:12 AM 08/21/2022    6:00 AM 08/20/2022    2:29 PM  CBC  WBC 4.0 - 10.5 K/uL 19.0  8.5  7.1   Hemoglobin 13.0 - 17.0 g/dL 16.1  09.6  04.5   Hematocrit 39.0 - 52.0 % 43.0  40.9  39.8   Platelets 150 - 400 K/uL 61  15  5       Latest Ref Rng & Units 08/21/2022    6:00 AM 08/20/2022    2:29 PM 08/07/2022   11:41 AM  CMP  Glucose 70 - 99 mg/dL 409  96  811   BUN 8 - 23 mg/dL Creatinine 0.61 - 1.24 mg/dL 9.14  7.82  9.56   Sodium 135 - 145 mmol/L 138  136  140   Potassium 3.5 - 5.1 mmol/L 3.9  4.1  5.6   Chloride 98 - 111 mmol/L 104  108  106   CO2 22 - 32 mmol/L Calcium 8.9 - 10.3 mg/dL 8.9  8.3  9.5   Total Protein 6.5 - 8.1 g/dL  6.4  6.8   Total Bilirubin 0.3 - 1.2 mg/dL  0.5  0.6   Alkaline Phos 38 - 126 U/L  63  55   AST 15 - 41 U/L  30  27   ALT 0 - 44 U/L  20  21      RADIOGRAPHIC STUDIES: I have personally reviewed the radiological images as listed and agreed with the findings in the report. US Venous Img Lower Unilateral  Right  Result Date: 08/20/2022 CLINICAL DATA:  Leg swelling. EXAM: RIGHT LOWER EXTREMITY VENOUS DOPPLER ULTRASOUND TECHNIQUE:  Gray-scale sonography with compression, as well as color and duplex ultrasound, were performed to evaluate the deep venous system(s) from the level of the common femoral vein through the popliteal and proximal calf veins. COMPARISON:  Duplex 06/13/2022 FINDINGS: VENOUS Normal compressibility of the common femoral, superficial femoral, and popliteal veins, as well as the visualized calf veins. Visualized portions of profunda femoral vein and great saphenous vein unremarkable. No filling defects to suggest DVT on grayscale or color Doppler imaging. Doppler waveforms show normal direction of venous flow, normal respiratory plasticity and response to augmentation. Limited views of the contralateral common femoral vein are unremarkable. OTHER None. Limitations: none IMPRESSION: No evidence of right lower extremity DVT. Electronically Signed   By: Narda Rutherford M.D.   On: 08/20/2022 15:16   DG Tibia/Fibula Right  Result Date: 08/20/2022 CLINICAL DATA:  Leg wound. EXAM: RIGHT TIBIA AND FIBULA - 2 VIEW COMPARISON:  None Available. FINDINGS: No fracture or dislocation. Preserved bone mineralization. There are degenerative changes of the knee particularly involving the medial compartment. There is sclerosis and loss of roundness to the medial femoral condyle. Please correlate for an osteochondral abnormality. No soft tissue gas identified or bony erosive changes IMPRESSION: No acute osseous abnormality. No definite bony erosive changes or soft tissue gas. If there is further concern of bone infection, bone scan or MRI could be considered as clinically appropriate for further sensitivity. Degenerative changes of the knee joint particularly the medial compartment Electronically Signed   By: Karen Kays M.D.   On: 08/20/2022 14:27    ASSESSMENT & PLAN:    62 y.o.  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 Previous CT abdomen pelvis done today shows no hepatosplenomegaly or signs of chronic liver disease.   #2 Recurrent right lower extremity cellulitis following injury to the right leg with skin breach.  #3 hypertension #4 coronary disease status post remote history of PCI x 2 #5 obesity  PLAN Discussed with patient' all his labs from today. He has been started on dexamethasone 40 mg daily for 4 days as instructed by Dr. Al Pimple Administered 1 dose of IVIG 1 g/kg. This is reasonable intervention currently and depending on his response we will consider starting him on weekly Nplate or Promacta. -Platelets today are slightly improved up from 15K to 61K.  He did get 1 unit of platelets. -Prophylactic platelet transfusion as needed for active bleeding or if platelets less than 10k -Antibiotics to treat the underlying cellulitis as per hospitalist. -Ultrasound lower extremity rule out right lower extremity DVT.  He will however require ultrasound to evaluate venous competence as outpatient due to chronic leg swelling there is a risk factor for recurrent cellulitis. -okay to discharge 4/24 from hematology standpoint if platelets continue to remain >50k.  The total time spent in the appointment was 25 minutes*.  All of the patient's questions were answered with apparent satisfaction. The patient knows to call the clinic with any problems, questions or concerns.   Wyvonnia Lora MD MS AAHIVMS [facility] Hematology/Oncology Physician [facility]  *Total Encounter Time as defined by the Centers for Medicare and Medicaid Services includes, in addition to the face-to-face time of a patient visit (documented in the note above) non-face-to-face time: obtaining and reviewing outside history, ordering and reviewing medications, tests  or procedures, care coordination (communications with other health care  professionals or caregivers) and documentation in the medical record.

## 2022-08-22 NOTE — Plan of Care (Signed)
  Problem: Education: Goal: Knowledge of General Education information will improve Description: Including pain rating scale, medication(s)/side effects and non-pharmacologic comfort measures Outcome: Completed/Met   Problem: Health Behavior/Discharge Planning: Goal: Ability to manage health-related needs will improve Outcome: Progressing   Problem: Clinical Measurements: Goal: Ability to maintain clinical measurements within normal limits will improve Outcome: Progressing Goal: Will remain free from infection Outcome: Progressing Goal: Diagnostic test results will improve Outcome: Progressing Goal: Respiratory complications will improve Outcome: Progressing Goal: Cardiovascular complication will be avoided Outcome: Completed/Met   Problem: Activity: Goal: Risk for activity intolerance will decrease Outcome: Completed/Met   Problem: Nutrition: Goal: Adequate nutrition will be maintained Outcome: Completed/Met   Problem: Coping: Goal: Level of anxiety will decrease Outcome: Completed/Met   Problem: Elimination: Goal: Will not experience complications related to bowel motility Outcome: Completed/Met Goal: Will not experience complications related to urinary retention Outcome: Completed/Met   Problem: Pain Managment: Goal: General experience of comfort will improve Outcome: Completed/Met   Problem: Safety: Goal: Ability to remain free from injury will improve Outcome: Completed/Met   Problem: Skin Integrity: Goal: Risk for impaired skin integrity will decrease Outcome: Progressing

## 2022-08-22 NOTE — Progress Notes (Addendum)
PROGRESS NOTE  Joel Hoffman  WUJ:[id number] DOB: 01/19/1961 DOA: 08/20/2022 PCP: Merleen Milliner, MD   Brief Narrative:  Patient is a 62 year old male with history of coronary artery disease, hyperlipidemia, hypertension, ITP who presented to the emergency department with complaint of right lower extremity wound, swelling, redness.  He was recently admitted in February for right lower extremity cellulitis and thrombocytopenia and was treated with IV antibiotics, discharged on cefadroxil.  Patient was treated with steroid, IVIG and platelet transfusion on that admission for thrombocytopenia.  Report of bumping his right lower extremity multiple times at home, works as a Nutritional therapist.  On presentation he was hypertensive.  Platelet level in the range of 5.  Patient given platelet transfusion and started on dexamethasone.  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  Right lower extremity venous Doppler negative for DVT.  Wound care consulted.  Follow-up blood cultures. X-ray of tibia/fibula did not find any issues problem.  Wound looking better.  We might change the antibiotic to doxycycline discharge.  Will also refer him to wound care clinic as an outpatient  Recurrent ITP: Platelets of 5000 on presentation.  Admitted for the same in February.  Hematology consulted.  Started on steroids.  Ggiven a dose of IVIG on 4/23 .  Also given platelet transfusion on 4/21.  Platelets in 60k  today  Leukocytosis: This is most likely secondary to steroids.  Continue to monitor.  Blood cultures remain no growth till date  Hyperlipidemia: On Lipitor  History of coronary artery disease: Takes aspirin at home, currently on  hold  Hypertension urgency: Currently blood pressure stable.  Continue lisinopril,coreg.  Continue prn medications for severe hypertension  Obesity: BMI 38.9        DVT prophylaxis:SCDs Start: 08/20/22 1813     Code Status: Full Code  Family Communication: None at the bedside  Patient status:Inpatient  Patient is from :Home  Anticipated discharge NF:AOZH  Estimated DC date: After improvement in the platelet level, right lower extremity wound, hematology clearance   Consultants: Hematology  Procedures:None  Antimicrobials:  Anti-infectives (From admission, onward)    Start     Dose/Rate Route Frequency Ordered Stop   08/21/22 0600  vancomycin (VANCOREADY) IVPB 1250 mg/250 mL        1,250 mg 166.7 mL/hr over 90 Minutes Intravenous Every 12 hours 08/20/22 1824     08/20/22 1815  vancomycin (VANCOREADY) IVPB 1250 mg/250 mL        1,250 mg 166.7 mL/hr over 90 Minutes Intravenous  Once 08/20/22 1814 08/20/22 2018   08/20/22 1415  cefTRIAXone (ROCEPHIN) 1 g in sodium chloride 0.9 % 100 mL IVPB        1 g 200 mL/hr over 30 Minutes Intravenous  Once 08/20/22 1410 08/20/22 1512   08/20/22 1415  vancomycin (VANCOCIN) IVPB 1000 mg/200 mL premix        1,000 mg 200 mL/hr over 60 Minutes Intravenous  Once 08/20/22 1410 08/20/22 1706       Subjective: Seen and examined at bedside.  Currently stable comfortable.  Afebrile.  Pain in the right lower extremity is well-controlled. When examined at the bedside, looks better than yesterday.  Objective: Vitals:   08/21/22 2106 08/22/22 0536  08/22/22 0600 08/22/22 0838  BP: (!) 143/88 107/77  (!) 121/93  Pulse: 63 72  84  Resp: 16 16    Temp: 97.6 F (36.4 C) (!) 97.5 F (36.4 C)    TempSrc: Oral Oral    SpO2: 98% 98%    Weight:   129.2 kg   Height:        Intake/Output Summary (Last 24 hours) at 08/22/2022 1137 Last data filed at 08/22/2022 0800 Gross per 24 hour  Intake 610 ml  Output --  Net 610 ml   Filed Weights    08/21/22 0500 08/21/22 0623 08/22/22 0600  Weight: 130.1 kg 130.1 kg 129.2 kg    Examination:  General exam: Overall comfortable, not in distress,obese HEENT: PERRL Respiratory system:  no wheezes or crackles  Cardiovascular system: S1 & S2 heard, RRR.  Gastrointestinal system: Abdomen is nondistended, soft and nontender. Central nervous system: Alert and oriented Extremities:  no clubbing ,no cyanosis, erythematous wound with ulceration of right leg on the dorsal side Skin: No rashes, no icterus     Data Reviewed: I have personally reviewed following labs and imaging studies  CBC: Recent Labs  Lab 08/20/22 1429 08/21/22 0600 08/22/22 0612  WBC 7.1 8.5 19.0*  NEUTROABS 4.6  --   --   HGB 13.2 13.2 14.0  HCT 39.8 40.9 43.0  MCV 87.5 88.3 89.2  PLT 5* 15* 61*   Basic Metabolic Panel: Recent Labs  Lab 08/20/22 1429 08/20/22 1847 08/21/22 0600  NA 136  --  138  K 4.1  --  3.9  CL 108  --  104  CO2 22  --  22  GLUCOSE 96  --  148*  BUN 15  --  14  CREATININE 0.76  --  0.79  CALCIUM 8.3*  --  8.9  MG  --  2.4  --      Recent Results (from the past 240 hour(s))  Blood culture (routine x 2)     Status: None (Preliminary result)   Collection Time: 08/20/22  2:29 PM   Specimen: BLOOD RIGHT FOREARM  Result Value Ref Range Status   Specimen Description   Final    BLOOD RIGHT FOREARM Performed at [facility], [address] [place], [place] [address]    Special Requests   Final    BOTTLES DRAWN AEROBIC AND ANAEROBIC Blood Culture results may not be optimal due to an inadequate volume of blood received in culture bottles Performed at [facility], [address] [place], [place] [address]    Culture   Final    NO GROWTH 2 DAYS Performed at [facility], [address], [place], [place] [address]    Report Status PENDING  Incomplete  Blood culture (routine x 2)     Status: None (Preliminary result)   Collection Time: 08/20/22   2:37 PM   Specimen: BLOOD  Result Value Ref Range Status   Specimen Description   Final    BLOOD RIGHT ANTECUBITAL Performed at [facility], [address] [place], [place] [address]    Special Requests   Final    BOTTLES DRAWN AEROBIC AND ANAEROBIC Blood Culture results may not be optimal due to an inadequate volume of blood received in culture bottles Performed at [facility], [address] [place], [place] [address]    Culture   Final    NO GROWTH 2 DAYS Performed  at [facility], [address], [place], [place] [address]    Report Status PENDING  Incomplete     Radiology Studies: US Venous Img Lower Unilateral Right  Result Date: 08/20/2022 CLINICAL DATA:  Leg swelling. EXAM: RIGHT LOWER EXTREMITY VENOUS DOPPLER ULTRASOUND TECHNIQUE: Gray-scale sonography with compression, as well as color and duplex ultrasound, were performed to evaluate the deep venous system(s) from the level of the common femoral vein through the popliteal and proximal calf veins. COMPARISON:  Duplex 06/13/2022 FINDINGS: VENOUS Normal compressibility of the common femoral, superficial femoral, and popliteal veins, as well as the visualized calf veins. Visualized portions of profunda femoral vein and great saphenous vein unremarkable. No filling defects to suggest DVT on grayscale or color Doppler imaging. Doppler waveforms show normal direction of venous flow, normal respiratory plasticity and response to augmentation. Limited views of the contralateral common femoral vein are unremarkable. OTHER None. Limitations: none IMPRESSION: No evidence of right lower extremity DVT. Electronically Signed   By: Narda Rutherford M.D.   On: 08/20/2022 15:16   DG Tibia/Fibula Right  Result Date: 08/20/2022 CLINICAL DATA:  Leg wound. EXAM: RIGHT TIBIA AND FIBULA - 2 VIEW COMPARISON:  None Available. FINDINGS: No fracture or dislocation. Preserved bone mineralization. There are degenerative changes  of the knee particularly involving the medial compartment. There is sclerosis and loss of roundness to the medial femoral condyle. Please correlate for an osteochondral abnormality. No soft tissue gas identified or bony erosive changes IMPRESSION: No acute osseous abnormality. No definite bony erosive changes or soft tissue gas. If there is further concern of bone infection, bone scan or MRI could be considered as clinically appropriate for further sensitivity. Degenerative changes of the knee joint particularly the medial compartment Electronically Signed   By: Karen Kays M.D.   On: 08/20/2022 14:27    Scheduled Meds:  sodium chloride   Intravenous Once   acetaminophen  650 mg Oral Once   atorvastatin  80 mg Oral QHS   carvedilol  12.5 mg Oral BID   dexamethasone  40 mg Oral Daily   diphenhydrAMINE  25 mg Oral Once   lisinopril  10 mg Oral Daily   silver sulfADIAZINE   Topical Daily   sodium chloride flush  3 mL Intravenous Q12H   Continuous Infusions:  Immune Globulin 10%     vancomycin 1,250 mg (08/22/22 0536)     LOS: 2 days   Burnadette Pop, MD [facility] P4/23/2024, 11:37 AM

## 2022-08-22 NOTE — Progress Notes (Addendum)
Hematology [facility]ort note  .    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 -receiving dexamethasone induction and IV IVIG. -transfuse plt<10K Might consider starting Nplate/Promacta to keep platelets>30k -will continue to follow  Wyvonnia Lora MD MS -  .  HEMATOLOGY/ONCOLOGY INPATIENT PROGRESS NOTE  Date of Service: 08/21/2022  Inpatient Attending: .Burnadette Pop, MD   SUBJECTIVE   Patient was seen and hematology follow-up for evaluation and management of relapsed ITP in the context of recurrent cellulitis of the right lower extremity.  Patient has been started on antibiotics per the hospitalist and also on induction dose dexamethasone 40 mg daily. IVIG was ordered by Dr. Al Pimple. Ultrasound of the right lower extremity showed no evidence of DVT. Patient did receive platelet transfusion and platelets today were slightly improved up from 5k to 15k. Patient denies any nosebleeds gum bleeds or overt GI bleeding.  Does have some large bruises around areas of trauma on his lower extremities. Denies any fevers chills or night sweats today.  OBJECTIVE:  NAD  PHYSICAL EXAMINATION: . Vitals:   08/21/22 2106 08/22/22 0536 08/22/22 0600 08/22/22 0838  BP: (!) 143/88 107/77  (!) 121/93  Pulse: 63 72  84  Resp: 16 16    Temp: 97.6 F (36.4 C) (!) 97.5 F (36.4 C)    TempSrc: Oral Oral    SpO2: 98% 98%    Weight:   284 lb 13.4 oz (129.2 kg)   Height:       Filed Weights   08/21/22 0500 08/21/22 0623 08/22/22 0600  Weight: 286 lb 13.1 oz (130.1 kg) 286 lb 13.1 oz (130.1 kg) 284 lb 13.4 oz (129.2 kg)   .Body mass index is 38.63 kg/m.  GENERAL:alert, in no acute distress and  comfortable NECK: supple, no JVD, thyroid normal size, non-tender, without nodularity LYMPH:  no palpable lymphadenopathy in the cervical, axillary or inguinal LUNGS: clear to auscultation with normal respiratory effort HEART: regular rate & rhythm,  no murmurs. ABDOMEN: abdomen soft, non-tender, normoactive bowel sounds  Musculoskeletal: Bilateral lower extremity venous stasis changes.  Right lower extremity swelling over the calf with redness and skin breakdown currently wrapped with gauze and Ace wrap. PSYCH: alert & oriented x 3 with fluent speech NEURO: no focal motor/sensory deficits  MEDICAL HISTORY:  Past Medical History:  Diagnosis Date   Coronary artery disease    Gastric ulcer    Heart attack    Hypercholesteremia    Hypertension     SURGICAL HISTORY: Past Surgical History:  Procedure Laterality Date   CORONARY ANGIOPLASTY WITH STENT PLACEMENT     TONSILLECTOMY      SOCIAL HISTORY: Social History   Socioeconomic History   Marital status: Single    Spouse name: Not on file   Number of children: Not on file   Years of education: Not on file   Highest education level: Not on file  Occupational History   Occupation: Ecologist: Production assistant, radio FOR SELF EMPLOYED    Comment: Retired from [employer]  Tobacco  Use   Smoking status: Never   Smokeless tobacco: Never  Vaping Use   Vaping Use: Never used  Substance and Sexual Activity   Alcohol use: No    Comment: "Quit 24 years ago"   Drug use: No   Sexual activity: Not Currently  Other Topics Concern   Not on file  Social History Narrative   Not on file   Social Determinants of Health   Financial Resource Strain: Not on file  Food Insecurity: No Food Insecurity (08/21/2022)   Hunger Vital Sign    Worried About Running Out of Food in the Last Year: Never true    Ran Out of Food in the Last Year: Never true  Transportation Needs: No Transportation Needs (08/21/2022)   PRAPARE - Doctor, general practice (Medical): No    Lack of Transportation (Non-Medical): No  Physical Activity: Not on file  Stress: Not on file  Social Connections: Not on file  Intimate Partner Violence: Not At Risk (08/21/2022)   Humiliation, Afraid, Rape, and Kick questionnaire    Fear of Current or Ex-Partner: No    Emotionally Abused: No    Physically Abused: No    Sexually Abused: No    FAMILY HISTORY: Family History  Problem Relation Age of Onset   Dementia Mother    Cancer Father    Diabetes Father    Emphysema Father    Colon cancer Father    Hypertension Brother     ALLERGIES:  is allergic to ampicillin.  MEDICATIONS:  Scheduled Meds:  sodium chloride   Intravenous Once   atorvastatin  80 mg Oral QHS   carvedilol  12.5 mg Oral BID   dexamethasone  40 mg Oral Daily   lisinopril  10 mg Oral Daily   silver sulfADIAZINE   Topical Daily   sodium chloride flush  3 mL Intravenous Q12H   Continuous Infusions:  vancomycin 1,250 mg (08/22/22 0536)   PRN Meds:.acetaminophen, hydrALAZINE, ondansetron **OR** ondansetron (ZOFRAN) IV  REVIEW OF SYSTEMS:    10 Point review of Systems was done is negative except as noted above.   LABORATORY DATA:  I have reviewed the data as listed  .    Latest Ref Rng & Units 08/22/2022    6:12 AM 08/21/2022    6:00 AM 08/20/2022    2:29 PM  CBC  WBC 4.0 - 10.5 K/uL 19.0  8.5  7.1   Hemoglobin 13.0 - 17.0 g/dL 09.8  11.9  14.7   Hematocrit 39.0 - 52.0 % 43.0  40.9  39.8   Platelets 150 - 400 K/uL 61  15  5     .    Latest Ref Rng & Units 08/21/2022    6:00 AM 08/20/2022    2:29 PM 08/07/2022   11:41 AM  CMP  Glucose 70 - 99 mg/dL 829  96  562   BUN 8 - 23 mg/dL 14  15  19    Creatinine 0.61 - 1.24 mg/dL 1.30  8.65  7.84   Sodium 135 - 145 mmol/L 138  136  140   Potassium 3.5 - 5.1 mmol/L 3.9  4.1  5.6   Chloride 98 - 111 mmol/L 104  108  106   CO2 22 - 32 mmol/L 22  22  30    Calcium 8.9 - 10.3 mg/dL 8.9  8.3  9.5   Total Protein 6.5 -  8.1 g/dL  6.4  6.8   Total Bilirubin 0.3 -  1.2 mg/dL  0.5  0.6   Alkaline Phos 38 - 126 U/L  63  55   AST 15 - 41 U/L  30  27   ALT 0 - 44 U/L  20  21      RADIOGRAPHIC STUDIES: I have personally reviewed the radiological images as listed and agreed with the findings in the report. US Venous Img Lower Unilateral Right  Result Date: 08/20/2022 CLINICAL DATA:  Leg swelling. EXAM: RIGHT LOWER EXTREMITY VENOUS DOPPLER ULTRASOUND TECHNIQUE: Gray-scale sonography with compression, as well as color and duplex ultrasound, were performed to evaluate the deep venous system(s) from the level of the common femoral vein through the popliteal and proximal calf veins. COMPARISON:  Duplex 06/13/2022 FINDINGS: VENOUS Normal compressibility of the common femoral, superficial femoral, and popliteal veins, as well as the visualized calf veins. Visualized portions of profunda femoral vein and great saphenous vein unremarkable. No filling defects to suggest DVT on grayscale or color Doppler imaging. Doppler waveforms show normal direction of venous flow, normal respiratory plasticity and response to augmentation. Limited views of the contralateral common femoral vein are unremarkable. OTHER None. Limitations: none IMPRESSION: No evidence of right lower extremity DVT. Electronically Signed   By: Narda Rutherford M.D.   On: 08/20/2022 15:16   DG Tibia/Fibula Right  Result Date: 08/20/2022 CLINICAL DATA:  Leg wound. EXAM: RIGHT TIBIA AND FIBULA - 2 VIEW COMPARISON:  None Available. FINDINGS: No fracture or dislocation. Preserved bone mineralization. There are degenerative changes of the knee particularly involving the medial compartment. There is sclerosis and loss of roundness to the medial femoral condyle. Please correlate for an osteochondral abnormality. No soft tissue gas identified or bony erosive changes IMPRESSION: No acute osseous abnormality. No definite bony erosive changes or soft tissue gas. If there is further  concern of bone infection, bone scan or MRI could be considered as clinically appropriate for further sensitivity. Degenerative changes of the knee joint particularly the medial compartment Electronically Signed   By: Karen Kays M.D.   On: 08/20/2022 14:27    ASSESSMENT & PLAN:    62 year old male with   #1 h/o Severe Acute ITP related to previous right lower extremity cellulitis now with worsening in the context of repeat trauma and recurrent right lower extremity cellulitis. Previous CT abdomen pelvis done today shows no hepatosplenomegaly or signs of chronic liver disease.   #2 Recurrent right lower extremity cellulitis following injury to the right leg with skin breach.  #3 hypertension #4 coronary disease status post remote history of PCI x 2 #5 obesity  PLAN Discussed with patient' all his labs from today. He has been started on dexamethasone 40 mg daily for 4 days as instructed by Dr. Al Pimple also placed an order for 1 dose of IVIG 1 g/kg. This is reasonable intervention currently and depending on his response we will consider starting him on weekly Nplate or Promacta. -Platelets today are slightly improved up from 5000-15,000.  He did get 1 unit of platelets. -Prophylactic platelet transfusion as needed for active bleeding or if platelets less than 10k -Antibiotics to treat the underlying cellulitis as per hospitalist. -Ultrasound lower extremity rule out right lower extremity DVT.  He will however require ultrasound to evaluate venous competence as outpatient due to chronic leg swelling there is a risk factor for recurrent cellulitis. -Hematology will continue to follow  The total time spent in the appointment was 35 minutes*.  All of the patient's questions were answered with apparent satisfaction. The  patient knows to call the clinic with any problems, questions or concerns.   Wyvonnia Lora MD MS AAHIVMS [facility] Hematology/Oncology Physician [facility]  .*Total Encounter Time as defined by the Centers for Medicare and Medicaid Services includes, in addition to the face-to-face time of a patient visit (documented in the note above) non-face-to-face time: obtaining and reviewing outside history, ordering and reviewing medications, tests or procedures, care coordination (communications with other health care professionals or caregivers) and documentation in the medical record.

## 2022-08-22 NOTE — Plan of Care (Signed)
  Problem: Health Behavior/Discharge Planning: Goal: Ability to manage health-related needs will improve Outcome: Progressing   Problem: Clinical Measurements: Goal: Diagnostic test results will improve Outcome: Progressing Goal: Respiratory complications will improve Outcome: Progressing   

## 2022-08-22 NOTE — Progress Notes (Signed)
   08/22/22 1800  Pain Assessment  Pain Scale 0-10  Pain Score 3  Pain Type Acute pain  Pain Location Head  Pain Descriptors / Indicators Headache  Pain Frequency Constant  Pain Onset On-going  Patients Stated Pain Goal 0  Pain Intervention(s) Medication (See eMAR);Rest;Relaxation;Emotional support  Multiple Pain Sites No  Complaints & Interventions  Complains of Other (Comment) (High Blood Pressure)  Interventions Medication (see MAR)  Provider Notification  Provider Name/Title Dr. Renford Dills  Date Provider Notified 08/22/22  Time Provider Notified 1800  Method of Notification Page  Notification Reason Change in status (high BP w/ headache)  Provider response See new orders  Date of Provider Response 08/22/22  Time of Provider Response 1800

## 2022-08-23 ENCOUNTER — Telehealth: Payer: Self-pay | Admitting: Hematology

## 2022-08-23 DIAGNOSIS — D693 Immune thrombocytopenic purpura: Secondary | ICD-10-CM | POA: Diagnosis not present

## 2022-08-23 LAB — CBC
HCT: 40 % (ref 39.0–52.0)
Hemoglobin: 13 g/dL (ref 13.0–17.0)
MCH: 29.2 pg (ref 26.0–34.0)
MCHC: 32.5 g/dL (ref 30.0–36.0)
MCV: 89.9 fL (ref 80.0–100.0)
Platelets: 97 10*3/uL — ABNORMAL LOW (ref 150–400)
RBC: 4.45 MIL/uL (ref 4.22–5.81)
RDW: 13.9 % (ref 11.5–15.5)
WBC: 12.6 10*3/uL — ABNORMAL HIGH (ref 4.0–10.5)
nRBC: 0 % (ref 0.0–0.2)

## 2022-08-23 LAB — CREATININE, SERUM
Creatinine, Ser: 0.93 mg/dL (ref 0.61–1.24)
GFR, Estimated: >60 mL/min (ref >60)

## 2022-08-23 MED ORDER — SILVER SULFADIAZINE 1 % EX CREA: TOPICAL_CREAM | Freq: Every day | CUTANEOUS | 0 refills | Status: DC

## 2022-08-23 MED ORDER — SULFAMETHOXAZOLE-TRIMETHOPRIM 800-160 MG PO TABS
1.0000 | ORAL_TABLET | Freq: Two times a day (BID) | ORAL | Status: DC
  Administered 2022-08-23: 1 via ORAL
  Filled 2022-08-23: qty 1

## 2022-08-23 MED ORDER — DEXAMETHASONE 20 MG PO TABS: 40.0000 mg | ORAL_TABLET | Freq: Once | ORAL | 0 refills | Status: AC

## 2022-08-23 MED ORDER — SULFAMETHOXAZOLE-TRIMETHOPRIM 800-160 MG PO TABS: 1.0000 | ORAL_TABLET | Freq: Two times a day (BID) | ORAL | 0 refills | Status: DC

## 2022-08-23 MED ORDER — SULFAMETHOXAZOLE-TRIMETHOPRIM 800-160 MG PO TABS: 2.0000 | ORAL_TABLET | Freq: Two times a day (BID) | ORAL | 0 refills | Status: AC

## 2022-08-23 NOTE — Progress Notes (Signed)
Dressing changed before discharge. Discharge instructions reviewed with patient, all questions answered. Ivs removed.

## 2022-08-23 NOTE — Discharge Summary (Signed)
Physician Discharge Summary  Coley Kulikowski QMV:[id number] DOB: [birth year] DOA: 08/20/2022  PCP: Merleen Milliner, MD  Admit date: 08/20/2022 Discharge date: 08/23/2022  Admitted From: Home Disposition:  Home  Discharge Condition:Stable CODE STATUS:FULL Diet recommendation: Heart Healthy  Brief/Interim Summary: Patient is a 62 year old male with history of coronary artery disease, hyperlipidemia, hypertension, ITP who presented to the emergency department with complaint of right lower extremity wound, swelling, redness. He was recently admitted in February for right lower extremity cellulitis and thrombocytopenia and was treated with IV antibiotics, discharged on cefadroxil. Patient was treated with steroid, IVIG and platelet transfusion on that admission for thrombocytopenia. Report of bumping his right lower extremity multiple times at home, works as a Nutritional therapist. On presentation he was hypertensive. Platelet level in the range of 5. Patient given platelet transfusion and started on dexamethasone, given a dose of IVIG. Wound care following for right lower extremity cellulitis .  Platelet level significantly improved today.  Hematology cleared for discharge.  He will follow-up with wound care as an outpatient.  We have also recommended him to follow-up with vascular surgery for possible management of chronic vascular insufficiency that is predisposing him for wound.  Following problems were addressed during the hospitalization:  Right lower extremity wound/cellulitis: Started on vancomycin.  Right lower extremity venous Doppler negative for DVT.  Wound care consulted.  Follow-up blood cultures. X-ray of tibia/fibula did not find any issues problem.  Wound looking better.  Antibiotics changed to Bactrim on discharge.  Will also refer him to wound care clinic as an outpatient. Part of the problem is also possibly vascular insufficiency.  We recommend follow-up with vascular surgeon as an outpatient    Recurrent ITP: Platelets of 5000 on presentation.  Admitted for the same in February.  Hematology consulted.  Started on steroids.  Ggiven a dose of IVIG on 4/23 .  Also given platelet transfusion on 4/21.  Platelets in 90k  today.  He will follow-up with hematology as an outpatient   Leukocytosis: This is most likely secondary to steroids.  Improved.  Blood cultures remain no growth till date   Hyperlipidemia: On Lipitor   History of coronary artery disease: Takes aspirin at home, currently on hold   Hypertension urgency: Currently blood pressure stable.  Continue lisinopril,coreg.   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  We have provided referral as well 4)make an appointment with vascular surgery as an outpatient to evaluate your chronic venous insufficiency   Discharge wound care:   Complete by: As directed    As per wound care   Increase activity slowly   Complete by: As directed       Allergies as of 08/23/2022       Reactions   Ampicillin Rash        Medication List     STOP taking these  medications    aspirin EC 81 MG tablet       TAKE these medications    acetaminophen 500 MG tablet Commonly known as: TYLENOL Take 500 mg by mouth 2 (two) times daily as needed for mild pain or headache.   atorvastatin 80 MG tablet Commonly known as: LIPITOR Take 80 mg  by mouth every evening.   carvedilol 12.5 MG tablet Commonly known as: COREG Take 12.5 mg by mouth 2 (two) times daily.   CENTRUM SILVER 50+MEN PO Take 1 tablet by mouth daily with breakfast.   cetaphil cream Apply 1 Application topically daily as needed (for dryness- affected areas).   dexAMETHasone 20 MG Tabs Take 40 mg by mouth once for 1 dose. Start taking on: August 24, 2022   lisinopril 10 MG tablet Commonly known as: ZESTRIL Take 10 mg by mouth daily.   silver sulfADIAZINE 1 % cream Commonly known as: SILVADENE Apply topically daily.   sulfamethoxazole-trimethoprim 800-160 MG tablet Commonly known as: BACTRIM DS Take 1 tablet by mouth every 12 (twelve) hours for 7 days.               Discharge Care Instructions  (From admission, onward)           Start     Ordered   08/23/22 0000  Discharge wound care:       Comments: As per wound care   08/23/22 1039            Follow-up Information     Maeola Harman, MD. Schedule an appointment as soon as possible for a visit in 2 week(s).   Specialties: Vascular Surgery, Cardiology Contact information: [address] [place] [address] [phone]         Merleen Milliner, MD. Schedule an appointment as soon as possible for a visit in 1 week(s).   Specialty: Cardiology Contact information: [address] [place] [address] [phone]                Allergies  Allergen Reactions   Ampicillin Rash    Consultations: Hematology  Procedures/Studies: US Venous Img Lower Unilateral Right  Result Date: 08/20/2022 CLINICAL DATA:  Leg swelling. EXAM: RIGHT LOWER EXTREMITY VENOUS DOPPLER ULTRASOUND TECHNIQUE: Gray-scale sonography with compression, as well as color and duplex ultrasound, were performed to evaluate the deep venous system(s) from the level of the common femoral vein through the popliteal and proximal calf veins. COMPARISON:  Duplex 06/13/2022  FINDINGS: VENOUS Normal compressibility of the common femoral, superficial femoral, and popliteal veins, as well as the visualized calf veins. Visualized portions of profunda femoral vein and great saphenous vein unremarkable. No filling defects to suggest DVT on grayscale or color Doppler imaging. Doppler waveforms show normal direction of venous flow, normal respiratory plasticity and response to augmentation. Limited views of the contralateral common femoral vein are unremarkable. OTHER None. Limitations: none IMPRESSION: No evidence of right lower extremity DVT. Electronically Signed   By: Narda Rutherford M.D.   On: 08/20/2022 15:16   DG Tibia/Fibula Right  Result Date: 08/20/2022 CLINICAL DATA:  Leg wound. EXAM: RIGHT TIBIA AND FIBULA - 2 VIEW COMPARISON:  None Available. FINDINGS: No fracture or dislocation. Preserved bone mineralization. There are degenerative changes of the knee particularly involving the medial compartment. There is sclerosis and loss of roundness to the medial femoral condyle. Please correlate for an osteochondral abnormality. No soft tissue gas identified or bony erosive changes IMPRESSION: No acute osseous abnormality. No definite bony  erosive changes or soft tissue gas. If there is further concern of bone infection, bone scan or MRI could be considered as clinically appropriate for further sensitivity. Degenerative changes of the knee joint particularly the medial compartment Electronically Signed   By: Karen Kays M.D.   On: 08/20/2022 14:27      Subjective: Patient seen and examined at bedside today.  Hemodynamically stable.  He is eager  to go home.  Wound looks better.  Platelets level stable today for discharge  Discharge Exam: Vitals:   08/22/22 2052 08/23/22 0513  BP: (!) 158/94 (!) 116/51  Pulse: (!) 59 64  Resp: 20 16  Temp: 97.7 F (36.5 C) 97.8 F (36.6 C)  SpO2: 99% 99%   Vitals:   08/22/22 1908 08/22/22 2052 08/23/22 0500 08/23/22 0513  BP: (!)  167/79 (!) 158/94  (!) 116/51  Pulse: 62 (!) 59  64  Resp: 16 20  16   Temp: 97.7 F (36.5 C) 97.7 F (36.5 C)  97.8 F (36.6 C)  TempSrc: Oral Oral  Oral  SpO2: 98% 99%  99%  Weight:   129.1 kg   Height:        General: Pt is alert, awake, not in acute distress Cardiovascular: RRR, S1/S2 +, no rubs, no gallops Respiratory: CTA bilaterally, no wheezing, no rhonchi Abdominal: Soft, NT, ND, bowel sounds + Extremities: no edema, no cyanosis.  Right lower extremity wound    The results of significant diagnostics from this hospitalization (including imaging, microbiology, ancillary and laboratory) are listed below for reference.     Microbiology: Recent Results (from the past 240 hour(s))  Blood culture (routine x 2)     Status: None (Preliminary result)   Collection Time: 08/20/22  2:29 PM   Specimen: BLOOD RIGHT FOREARM  Result Value Ref Range Status   Specimen Description   Final    BLOOD RIGHT FOREARM Performed at [facility], [address] [place], [place] [address]    Special Requests   Final    BOTTLES DRAWN AEROBIC AND ANAEROBIC Blood Culture results may not be optimal due to an inadequate volume of blood received in culture bottles Performed at [facility], [address] [place], [place] [address]    Culture   Final    NO GROWTH 3 DAYS Performed at [facility], [address] [place], [place] [address]    Report Status PENDING  Incomplete  Blood culture (routine x 2)     Status: None (Preliminary result)   Collection Time: 08/20/22  2:37 PM   Specimen: BLOOD  Result Value Ref Range Status   Specimen Description   Final    BLOOD RIGHT ANTECUBITAL Performed at [facility], [address] [place], [place] [address]    Special Requests   Final    BOTTLES DRAWN AEROBIC AND ANAEROBIC Blood Culture results may not be optimal due to an inadequate volume of blood received in culture bottles Performed at [facility], [address] [place], [place] [address]    Culture   Final    NO GROWTH 3 DAYS Performed at [facility], [address], [place], [place] [address]    Report Status PENDING  Incomplete     Labs: BNP (last 3 results) No results for input(s): "BNP" in the last 8760 hours. Basic Metabolic Panel: Recent Labs  Lab 08/20/22 1429 08/20/22 1847 08/21/22 0600 08/23/22 0524  NA 136  --  138  --   K 4.1  --  3.9  --   CL 108  --  104  --   CO2 22  --  22  --   GLUCOSE 96  --  148*  --   BUN 15  --  14  --   CREATININE 0.76  --  0.79 0.93  CALCIUM 8.3*  --  8.9  --   MG  --  2.4  --   --    Liver Function Tests: Recent Labs  Lab 08/20/22 1429  AST 30  ALT 20  ALKPHOS 63  BILITOT 0.5  PROT 6.4*  ALBUMIN 3.3*   No results for input(s): "LIPASE", "AMYLASE" in the last 168 hours. No results for input(s): "AMMONIA" in the last 168 hours. CBC: Recent Labs  Lab 08/20/22 1429 08/21/22 0600 08/22/22 0612 08/23/22 0524  WBC 7.1 8.5 19.0* 12.6*  NEUTROABS 4.6  --   --   --   HGB 13.2 13.2 14.0 13.0  HCT 39.8 40.9 43.0 40.0  MCV 87.5 88.3 89.2 89.9  PLT 5* 15* 61* 97*   Cardiac Enzymes: No results for input(s): "CKTOTAL", "CKMB", "CKMBINDEX", "TROPONINI" in the last 168 hours. BNP: Invalid input(s): "POCBNP" CBG: No results for input(s): "GLUCAP" in the last 168 hours. D-Dimer No results for input(s): "DDIMER" in the last 72 hours. Hgb A1c No results for input(s): "HGBA1C" in the last 72 hours. Lipid Profile No results for input(s): "CHOL", "HDL", "LDLCALC", "TRIG", "CHOLHDL", "LDLDIRECT" in the last 72 hours. Thyroid function studies Recent Labs    08/20/22 1847  TSH 0.539   Anemia work up No results for input(s): "VITAMINB12", "FOLATE", "FERRITIN", "TIBC", "IRON", "RETICCTPCT" in the last 72 hours. Urinalysis No results found for: "COLORURINE", "APPEARANCEUR", "LABSPEC", "PHURINE", "GLUCOSEU", "HGBUR", "BILIRUBINUR", "KETONESUR", "PROTEINUR",  "UROBILINOGEN", "NITRITE", "LEUKOCYTESUR" Sepsis Labs Recent Labs  Lab 08/20/22 1429 08/21/22 0600 08/22/22 0612 08/23/22 0524  WBC 7.1 8.5 19.0* 12.6*   Microbiology Recent Results (from the past 240 hour(s))  Blood culture (routine x 2)     Status: None (Preliminary result)   Collection Time: 08/20/22  2:29 PM   Specimen: BLOOD RIGHT FOREARM  Result Value Ref Range Status   Specimen Description   Final    BLOOD RIGHT FOREARM Performed at [facility], [address] [place], [place] [address]    Special Requests   Final    BOTTLES DRAWN AEROBIC AND ANAEROBIC Blood Culture results may not be optimal due to an inadequate volume of blood received in culture bottles Performed at [facility], [address] [place], [place] [address]    Culture   Final    NO GROWTH 3 DAYS Performed at [facility], [address], [place], [place] [address]    Report Status PENDING  Incomplete  Blood culture (routine x 2)     Status: None (Preliminary result)   Collection Time: 08/20/22  2:37 PM   Specimen: BLOOD  Result Value Ref Range Status   Specimen Description   Final    BLOOD RIGHT ANTECUBITAL Performed at [facility], [address] [place], [place] [address]    Special Requests   Final    BOTTLES DRAWN AEROBIC AND ANAEROBIC Blood Culture results may not be optimal due to an inadequate volume of blood received in culture bottles Performed at [facility], [address], [place], [place] [address]  Culture   Final    NO GROWTH 3 DAYS Performed at [facility], [address] [place], [place] [address]    Report Status PENDING  Incomplete    Please note: You were cared for by a hospitalist during your hospital stay. Once you are discharged, your primary care physician will handle any further medical issues. Please note that NO REFILLS for any discharge medications will be authorized once you are discharged, as it is  imperative that you return to your primary care physician (or establish a relationship with a primary care physician if you do not have one) for your post hospital discharge needs so that they can reassess your need for medications and monitor your lab values.    Time coordinating discharge: 40 minutes  SIGNED:   Burnadette Pop, MD  [facility] 08/23/2022, 10:56 AM Pager [phone]  If 7PM-7AM, please contact night-coverage [email or web address] Password [email or web address]

## 2022-08-24 LAB — CULTURE, BLOOD (ROUTINE X 2)

## 2022-08-25 LAB — CULTURE, BLOOD (ROUTINE X 2)
Culture: NO GROWTH
Culture: NO GROWTH

## 2022-09-05 ENCOUNTER — Other Ambulatory Visit: Payer: Self-pay

## 2022-09-05 DIAGNOSIS — D693 Immune thrombocytopenic purpura: Secondary | ICD-10-CM

## 2022-09-06 ENCOUNTER — Telehealth: Payer: Self-pay | Admitting: Hematology

## 2022-09-06 ENCOUNTER — Inpatient Hospital Stay: Attending: Hematology | Admitting: Hematology

## 2022-09-06 ENCOUNTER — Other Ambulatory Visit: Payer: Self-pay

## 2022-09-06 ENCOUNTER — Inpatient Hospital Stay

## 2022-09-06 VITALS — BP 137/87 | HR 64 | Temp 97.3°F | Resp 17 | Wt 286.9 lb

## 2022-09-06 DIAGNOSIS — Z8711 Personal history of peptic ulcer disease: Secondary | ICD-10-CM | POA: Insufficient documentation

## 2022-09-06 DIAGNOSIS — E669 Obesity, unspecified: Secondary | ICD-10-CM | POA: Diagnosis not present

## 2022-09-06 DIAGNOSIS — L03115 Cellulitis of right lower limb: Secondary | ICD-10-CM | POA: Diagnosis not present

## 2022-09-06 DIAGNOSIS — Z79899 Other long term (current) drug therapy: Secondary | ICD-10-CM | POA: Insufficient documentation

## 2022-09-06 DIAGNOSIS — I252 Old myocardial infarction: Secondary | ICD-10-CM | POA: Insufficient documentation

## 2022-09-06 DIAGNOSIS — D693 Immune thrombocytopenic purpura: Secondary | ICD-10-CM | POA: Insufficient documentation

## 2022-09-06 DIAGNOSIS — I10 Essential (primary) hypertension: Secondary | ICD-10-CM | POA: Diagnosis not present

## 2022-09-06 DIAGNOSIS — I2510 Atherosclerotic heart disease of native coronary artery without angina pectoris: Secondary | ICD-10-CM | POA: Insufficient documentation

## 2022-09-06 LAB — CMP (CANCER CENTER ONLY)
ALT: 31 U/L (ref 0–44)
AST: 39 U/L (ref 15–41)
Albumin: 3.7 g/dL (ref 3.5–5.0)
Alkaline Phosphatase: 60 U/L (ref 38–126)
Anion gap: 3 — ABNORMAL LOW (ref 5–15)
BUN: 20 mg/dL (ref 8–23)
CO2: 25 mmol/L (ref 22–32)
Calcium: 8.9 mg/dL (ref 8.9–10.3)
Chloride: 108 mmol/L (ref 98–111)
Creatinine: 0.88 mg/dL (ref 0.61–1.24)
GFR, Estimated: >60 mL/min (ref >60)
Glucose, Bld: 87 mg/dL (ref 70–99)
Potassium: 4.6 mmol/L (ref 3.5–5.1)
Sodium: 136 mmol/L (ref 135–145)
Total Bilirubin: 0.4 mg/dL (ref 0.3–1.2)
Total Protein: 7 g/dL (ref 6.5–8.1)

## 2022-09-06 LAB — CBC WITH DIFFERENTIAL (CANCER CENTER ONLY)
Abs Immature Granulocytes: 0.02 10*3/uL (ref 0.00–0.07)
Basophils Absolute: 0.1 10*3/uL (ref 0.0–0.1)
Basophils Relative: 2 %
Eosinophils Absolute: 0.3 10*3/uL (ref 0.0–0.5)
Eosinophils Relative: 5 %
HCT: 40.3 % (ref 39.0–52.0)
Hemoglobin: 13.4 g/dL (ref 13.0–17.0)
Immature Granulocytes: 0 %
Lymphocytes Relative: 26 %
Lymphs Abs: 1.5 10*3/uL (ref 0.7–4.0)
MCH: 28.9 pg (ref 26.0–34.0)
MCHC: 33.3 g/dL (ref 30.0–36.0)
MCV: 86.9 fL (ref 80.0–100.0)
Monocytes Absolute: 0.7 10*3/uL (ref 0.1–1.0)
Monocytes Relative: 11 %
Neutro Abs: 3.3 10*3/uL (ref 1.7–7.7)
Neutrophils Relative %: 56 %
Platelet Count: 170 10*3/uL (ref 150–400)
RBC: 4.64 MIL/uL (ref 4.22–5.81)
RDW: 14.5 % (ref 11.5–15.5)
WBC Count: 5.9 10*3/uL (ref 4.0–10.5)
nRBC: 0 % (ref 0.0–0.2)

## 2022-09-06 LAB — IMMATURE PLATELET FRACTION: Immature Platelet Fraction: 6.9 % (ref 1.2–8.6)

## 2022-09-06 NOTE — Progress Notes (Signed)
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 male who has been referred to [place] by Dr Bradley Ferris MD for evaluation and management of thrombocytopenia.   Patient has a history of hypertension, dyslipidemia, coronary disease status post MI with 2 stents placed more than 5 years ago, remote history of gastric ulcer.   Patient presented to the emergency room with right lower extremity pain and warmth redness and increasing discomfort.  He notes that he had had 2 traumas to the same area on his right leg after which he started having leg pain and swelling.  He was started on doxycycline as outpatient and noted that this failed to resolve his leg pain which was worsening and therefore he came to the emergency room.   In the emergency room the patient was noted to have severe right lower extremity cellulitis and was started on IV ceftriaxone and vancomycin. 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  Patient was last seen by me on 07/10/2022 and reported a right leg wound and bilateral leg swelling.   Today, he reports that he has been staying cautious to avoid any injury or impact to his right LE. He reports that he wraps in right LE in extra bandage for protection while he is at work. He is currently taking Bactrim and has 3 remaining doses.   Patient has normal p.o. intake and denies any change in sleep/bowel habits or abdominal pain. He denies any bleeding issues, fever, or chills.  MEDICAL HISTORY:  Past Medical History:   Diagnosis Date   Coronary artery disease    Gastric ulcer    Heart attack (HCC)    Hypercholesteremia    Hypertension     SURGICAL HISTORY: Past Surgical History:  Procedure Laterality Date   CORONARY ANGIOPLASTY WITH STENT PLACEMENT     TONSILLECTOMY      SOCIAL HISTORY: Social History   Socioeconomic History   Marital status: Single    Spouse name: Not on file   Number of children: Not on file   Years of education: Not on file   Highest education level: Not on file  Occupational History   Occupation: Ecologist: Production assistant, radio FOR SELF EMPLOYED    Comment: Retired from [employer]  Tobacco Use   Smoking status: Never   Smokeless tobacco: Never  Vaping Use   Vaping Use: Never used  Substance and Sexual Activity   Alcohol use: No    Comment: "Quit 24 years ago"   Drug use: No   Sexual activity: Not Currently  Other Topics Concern   Not on file  Social History Narrative   Not on file   Social Determinants of Health   Financial Resource Strain: Not on file  Food Insecurity: No Food Insecurity (08/21/2022)   Hunger Vital Sign    Worried About Running Out of Food in the Last Year: Never true    Ran Out of Food in the Last Year: Never true  Transportation Needs: No Transportation Needs (08/21/2022)   PRAPARE - Transportation  Lack of Transportation (Medical): No    Lack of Transportation (Non-Medical): No  Physical Activity: Not on file  Stress: Not on file  Social Connections: Not on file  Intimate Partner Violence: Not At Risk (08/21/2022)   Humiliation, Afraid, Rape, and Kick questionnaire    Fear of Current or Ex-Partner: No    Emotionally Abused: No    Physically Abused: No    Sexually Abused: No    FAMILY HISTORY: Family History  Problem Relation Age of Onset   Dementia Mother    Cancer Father    Diabetes Father    Emphysema Father    Colon cancer Father    Hypertension Brother     ALLERGIES:  is allergic to  ampicillin.  MEDICATIONS:  Current Outpatient Medications  Medication Sig Dispense Refill   acetaminophen (TYLENOL) 500 MG tablet Take 500 mg by mouth 2 (two) times daily as needed for mild pain or headache.     atorvastatin (LIPITOR) 80 MG tablet Take 80 mg by mouth every evening.     carvedilol (COREG) 12.5 MG tablet Take 12.5 mg by mouth 2 (two) times daily.     Emollient (CETAPHIL) cream Apply 1 Application topically daily as needed (for dryness- affected areas).     lisinopril (ZESTRIL) 10 MG tablet Take 10 mg by mouth daily.     Multiple Vitamins-Minerals (CENTRUM SILVER 50+MEN PO) Take 1 tablet by mouth daily with breakfast.     silver sulfADIAZINE (SILVADENE) 1 % cream Apply topically daily. 50 g 0   No current facility-administered medications for this visit.    REVIEW OF SYSTEMS:    10 Point review of Systems was done is negative except as noted above.   PHYSICAL EXAMINATION: ECOG PERFORMANCE STATUS: 1 - Symptomatic but completely ambulatory  . Vitals:   09/06/22 1132  BP: 137/87  Pulse: 64  Resp: 17  Temp: (!) 97.3 F (36.3 C)  SpO2: 100%     GENERAL:alert, in no acute distress and comfortable SKIN: no acute rashes, no significant lesions EYES: conjunctiva are pink and non-injected, sclera anicteric OROPHARYNX: MMM, no exudates, no oropharyngeal erythema or ulceration NECK: supple, no JVD LYMPH:  no palpable lymphadenopathy in the cervical, axillary or inguinal regions LUNGS: clear to auscultation b/l with normal respiratory effort HEART: regular rate & rhythm ABDOMEN:  normoactive bowel sounds , non tender, not distended. Extremity: no pedal edema PSYCH: alert & oriented x 3 with fluent speech NEURO: no focal motor/sensory deficits   LABORATORY DATA:  I have reviewed the data as listed .    Latest Ref Rng & Units 09/06/2022   10:54 AM 08/23/2022    5:24 AM 08/22/2022    6:12 AM  CBC  WBC 4.0 - 10.5 K/uL 5.9  12.6  19.0   Hemoglobin 13.0 - 17.0 g/dL  16.1  09.6  04.5   Hematocrit 39.0 - 52.0 % 40.3  40.0  43.0   Platelets 150 - 400 K/uL 170  97  61    .    Latest Ref Rng & Units 09/06/2022   10:54 AM 08/23/2022    5:24 AM 08/21/2022    6:00 AM  CMP  Glucose 70 - 99 mg/dL 87   409   BUN 8 - 23 mg/dL 20   14   Creatinine 8.11 - 1.24 mg/dL 9.14  7.82  9.56   Sodium 135 - 145 mmol/L 136   138   Potassium 3.5 - 5.1 mmol/L 4.6   3.9  Chloride 98 - 111 mmol/L 108   104   CO2 22 - 32 mmol/L 25   22   Calcium 8.9 - 10.3 mg/dL 8.9   8.9   Total Protein 6.5 - 8.1 g/dL 7.0     Total Bilirubin 0.3 - 1.2 mg/dL 0.4     Alkaline Phos 38 - 126 U/L 60     AST 15 - 41 U/L 39     ALT 0 - 44 U/L 31      Immature platelet fraction 6.9$   RADIOGRAPHIC STUDIES: I have personally reviewed the radiological images as listed and agreed with the findings in the report. US Venous Img Lower Unilateral Right  Result Date: 08/20/2022 CLINICAL DATA:  Leg swelling. EXAM: RIGHT LOWER EXTREMITY VENOUS DOPPLER ULTRASOUND TECHNIQUE: Gray-scale sonography with compression, as well as color and duplex ultrasound, were performed to evaluate the deep venous system(s) from the level of the common femoral vein through the popliteal and proximal calf veins. COMPARISON:  Duplex 06/13/2022 FINDINGS: VENOUS Normal compressibility of the common femoral, superficial femoral, and popliteal veins, as well as the visualized calf veins. Visualized portions of profunda femoral vein and great saphenous vein unremarkable. No filling defects to suggest DVT on grayscale or color Doppler imaging. Doppler waveforms show normal direction of venous flow, normal respiratory plasticity and response to augmentation. Limited views of the contralateral common femoral vein are unremarkable. OTHER None. Limitations: none IMPRESSION: No evidence of right lower extremity DVT. Electronically Signed   By: Narda Rutherford M.D.   On: 08/20/2022 15:16   DG Tibia/Fibula Right  Result Date:  08/20/2022 CLINICAL DATA:  Leg wound. EXAM: RIGHT TIBIA AND FIBULA - 2 VIEW COMPARISON:  None Available. FINDINGS: No fracture or dislocation. Preserved bone mineralization. There are degenerative changes of the knee particularly involving the medial compartment. There is sclerosis and loss of roundness to the medial femoral condyle. Please correlate for an osteochondral abnormality. No soft tissue gas identified or bony erosive changes IMPRESSION: No acute osseous abnormality. No definite bony erosive changes or soft tissue gas. If there is further concern of bone infection, bone scan or MRI could be considered as clinically appropriate for further sensitivity. Degenerative changes of the knee joint particularly the medial compartment Electronically Signed   By: Karen Kays M.D.   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 Immature platelet fraction of 37.2% suggesting excellent bone marrow response. Use of high doses of ibuprofen over the last 5 days could also be a concern. Normal TSH B12 and folate levels Monospot test negative All of the patients questions were answered with apparent satisfaction. The patient knows to call the clinic with any problems, questions or concerns. CT abdomen pelvis done today shows no hepatosplenomegaly or signs of chronic liver disease.   #2 right lower extremity cellulitis following injury to the right leg with skin breach.-On ceftriaxone and vancomycin per hospitalist.  #3 hypertension #4 coronary disease status post remote history of PCI x 2 #5 obesity  PLAN:  -Discussed lab results on 09/06/2022 in detail with patient. CBC showed WBC of 5.9K, hemoglobin of 13.4, and normal platelets of 170K. -patient's platelets have responded well to  steroids and IVIG -patient is in remission at this time -Will continue to monitor with labs  as IVIG leaves patient's system  -discussed the need to determine whether ITP was triggered by his recent skin infection or if it will relapse without any infection. -continue to monitor with labs every 2 weeks -continue to use compression socks to keep fluid levels in lower extremities low and reduce any risk of infection  -advised patient to follow-up with wound care appointment for optimal evaluation of blood vessels  FOLLOW-UP: Phone visit in 2 weeks Labs 1 day prior to phone visit  The total time spent in the appointment was 20 minutes* .  All of the patient's questions were answered with apparent satisfaction. The patient knows to call the clinic with any problems, questions or concerns.   Wyvonnia Lora MD MS AAHIVMS [facility] Hematology/Oncology Physician [facility]  .*Total Encounter Time as defined by the Centers for Medicare and Medicaid Services includes, in addition to the face-to-face time of a patient visit (documented in the note above) non-face-to-face time: obtaining and reviewing outside history, ordering and reviewing medications, tests or procedures, care coordination (communications with other health care professionals or caregivers) and documentation in the medical record.    I,Mitra Faeizi,acting as a Neurosurgeon for Wyvonnia Lora, MD.,have documented all relevant documentation on the behalf of Wyvonnia Lora, MD,as directed by  Wyvonnia Lora, MD while in the presence of Wyvonnia Lora, MD.  .I have reviewed the above documentation for accuracy and completeness, and I agree with the above. [name] MD

## 2022-09-12 ENCOUNTER — Encounter: Payer: Self-pay | Admitting: Hematology and Oncology

## 2022-09-15 ENCOUNTER — Other Ambulatory Visit: Payer: Self-pay | Admitting: *Deleted

## 2022-09-15 DIAGNOSIS — D693 Immune thrombocytopenic purpura: Secondary | ICD-10-CM

## 2022-09-18 ENCOUNTER — Inpatient Hospital Stay

## 2022-09-18 DIAGNOSIS — D693 Immune thrombocytopenic purpura: Secondary | ICD-10-CM

## 2022-09-18 LAB — CMP (CANCER CENTER ONLY)
ALT: 18 U/L (ref 0–44)
AST: 29 U/L (ref 15–41)
Albumin: 3.8 g/dL (ref 3.5–5.0)
Alkaline Phosphatase: 67 U/L (ref 38–126)
Anion gap: 5 (ref 5–15)
BUN: 16 mg/dL (ref 8–23)
CO2: 27 mmol/L (ref 22–32)
Calcium: 8.8 mg/dL — ABNORMAL LOW (ref 8.9–10.3)
Chloride: 108 mmol/L (ref 98–111)
Creatinine: 0.79 mg/dL (ref 0.61–1.24)
GFR, Estimated: >60 mL/min (ref >60)
Glucose, Bld: 82 mg/dL (ref 70–99)
Potassium: 4.7 mmol/L (ref 3.5–5.1)
Sodium: 140 mmol/L (ref 135–145)
Total Bilirubin: 0.4 mg/dL (ref 0.3–1.2)
Total Protein: 6.6 g/dL (ref 6.5–8.1)

## 2022-09-18 LAB — CBC WITH DIFFERENTIAL (CANCER CENTER ONLY)
Abs Immature Granulocytes: 0.03 10*3/uL (ref 0.00–0.07)
Basophils Absolute: 0.1 10*3/uL (ref 0.0–0.1)
Basophils Relative: 1 %
Eosinophils Absolute: 0.5 10*3/uL (ref 0.0–0.5)
Eosinophils Relative: 8 %
HCT: 39.5 % (ref 39.0–52.0)
Hemoglobin: 13.4 g/dL (ref 13.0–17.0)
Immature Granulocytes: 1 %
Lymphocytes Relative: 31 %
Lymphs Abs: 1.9 10*3/uL (ref 0.7–4.0)
MCH: 29.7 pg (ref 26.0–34.0)
MCHC: 33.9 g/dL (ref 30.0–36.0)
MCV: 87.6 fL (ref 80.0–100.0)
Monocytes Absolute: 0.6 10*3/uL (ref 0.1–1.0)
Monocytes Relative: 10 %
Neutro Abs: 3 10*3/uL (ref 1.7–7.7)
Neutrophils Relative %: 49 %
Platelet Count: 97 10*3/uL — ABNORMAL LOW (ref 150–400)
RBC: 4.51 MIL/uL (ref 4.22–5.81)
RDW: 15.3 % (ref 11.5–15.5)
WBC Count: 6.1 10*3/uL (ref 4.0–10.5)
nRBC: 0 % (ref 0.0–0.2)

## 2022-09-18 LAB — IMMATURE PLATELET FRACTION: Immature Platelet Fraction: 9.2 % — ABNORMAL HIGH (ref 1.2–8.6)

## 2022-09-19 ENCOUNTER — Inpatient Hospital Stay (HOSPITAL_BASED_OUTPATIENT_CLINIC_OR_DEPARTMENT_OTHER): Admitting: Hematology

## 2022-09-19 DIAGNOSIS — D693 Immune thrombocytopenic purpura: Secondary | ICD-10-CM | POA: Diagnosis not present

## 2022-09-19 NOTE — Progress Notes (Signed)
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 male who has been referred to [place] by Dr Bradley Ferris MD for evaluation and management of thrombocytopenia.   Patient has a history of hypertension, dyslipidemia, coronary disease status post MI with 2 stents placed more than 5 years ago, remote history of gastric ulcer.   Patient presented to the emergency room with right lower extremity pain and warmth redness and increasing discomfort.  He notes that he had had 2 traumas to the same area on his right leg after which he started having leg pain and swelling.  He was started on doxycycline as outpatient and noted that this failed to resolve his leg pain which was worsening and therefore he came to the emergency room.   In the emergency room the patient was noted to have severe right lower extremity cellulitis and was started on IV ceftriaxone and vancomycin. 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  .I connected with [name] on 09/19/2022 at  8:40 AM EDT by telephone visit and verified that I am speaking with the correct person using two identifiers.   Patient was last seen by me on 09/06/2022 and he was doing fairly well.   Patient notes he has been doing well overall without any new or severe medical concerns since our last visit. He denies any new infections, fever, chills, night sweats, abdominal pain, chest pain, bone pain, back pain, shortness of breath, or leg swelling.   He denies taking any OTC pain  medication. He regularly takes multi-vitamin supplement.   We discussed the patient's lab results from 09/18/2022.  I discussed the limitations, risks, security and privacy concerns of performing an evaluation and management service by telemedicine and the availability of in-person appointments. I also discussed with the patient that there may be a patient responsible charge related to this service. The patient expressed understanding and agreed to proceed.   Other persons participating in the visit and their role in the encounter: None   Patient's location: Home  Provider's location: [facility]   Chief Complaint: Acute ITP    MEDICAL HISTORY:  Past Medical History:  Diagnosis Date   Coronary artery disease    Gastric ulcer    Heart attack (HCC)    Hypercholesteremia    Hypertension     SURGICAL HISTORY: Past Surgical History:  Procedure Laterality Date   CORONARY ANGIOPLASTY WITH STENT PLACEMENT     TONSILLECTOMY      SOCIAL HISTORY: Social History   Socioeconomic History   Marital status: Single    Spouse name: Not on file   Number of children: Not on file   Years of education: Not on file   Highest education level: Not on file  Occupational History   Occupation: Ecologist: Production assistant, radio FOR SELF EMPLOYED    Comment: Retired from [employer]  Tobacco Use   Smoking status: Never   Smokeless tobacco: Never  Vaping Use   Vaping Use: Never used  Substance and Sexual Activity   Alcohol use: No  Comment: "Quit 24 years ago"   Drug use: No   Sexual activity: Not Currently  Other Topics Concern   Not on file  Social History Narrative   Not on file   Social Determinants of Health   Financial Resource Strain: Not on file  Food Insecurity: No Food Insecurity (08/21/2022)   Hunger Vital Sign    Worried About Running Out of Food in the Last Year: Never true    Ran Out of Food in the Last Year: Never true  Transportation Needs: No Transportation Needs  (08/21/2022)   PRAPARE - Administrator, Civil Service (Medical): No    Lack of Transportation (Non-Medical): No  Physical Activity: Not on file  Stress: Not on file  Social Connections: Not on file  Intimate Partner Violence: Not At Risk (08/21/2022)   Humiliation, Afraid, Rape, and Kick questionnaire    Fear of Current or Ex-Partner: No    Emotionally Abused: No    Physically Abused: No    Sexually Abused: No    FAMILY HISTORY: Family History  Problem Relation Age of Onset   Dementia Mother    Cancer Father    Diabetes Father    Emphysema Father    Colon cancer Father    Hypertension Brother     ALLERGIES:  is allergic to ampicillin.  MEDICATIONS:  Current Outpatient Medications  Medication Sig Dispense Refill   acetaminophen (TYLENOL) 500 MG tablet Take 500 mg by mouth 2 (two) times daily as needed for mild pain or headache.     atorvastatin (LIPITOR) 80 MG tablet Take 80 mg by mouth every evening.     carvedilol (COREG) 12.5 MG tablet Take 12.5 mg by mouth 2 (two) times daily.     Emollient (CETAPHIL) cream Apply 1 Application topically daily as needed (for dryness- affected areas).     lisinopril (ZESTRIL) 10 MG tablet Take 10 mg by mouth daily.     Multiple Vitamins-Minerals (CENTRUM SILVER 50+MEN PO) Take 1 tablet by mouth daily with breakfast.     silver sulfADIAZINE (SILVADENE) 1 % cream Apply topically daily. 50 g 0   No current facility-administered medications for this visit.    REVIEW OF SYSTEMS:    10 Point review of Systems was done is negative except as noted above.   PHYSICAL EXAMINATION: TELE-MED VISIT LABORATORY DATA:  I have reviewed the data as listed .    Latest Ref Rng & Units 09/18/2022   10:41 AM 09/06/2022   10:54 AM 08/23/2022    5:24 AM  CBC  WBC 4.0 - 10.5 K/uL 6.1  5.9  12.6   Hemoglobin 13.0 - 17.0 g/dL 11.9  14.7  82.9   Hematocrit 39.0 - 52.0 % 39.5  40.3  40.0   Platelets 150 - 400 K/uL 97  170  97    .    Latest  Ref Rng & Units 09/18/2022   10:41 AM 09/06/2022   10:54 AM 08/23/2022    5:24 AM  CMP  Glucose 70 - 99 mg/dL 82  87    BUN 8 - 23 mg/dL 16  20    Creatinine 5.62 - 1.24 mg/dL 1.30  8.65  7.84   Sodium 135 - 145 mmol/L 140  136    Potassium 3.5 - 5.1 mmol/L 4.7  4.6    Chloride 98 - 111 mmol/L 108  108    CO2 22 - 32 mmol/L 27  25    Calcium 8.9 - 10.3  mg/dL 8.8  8.9    Total Protein 6.5 - 8.1 g/dL 6.6  7.0    Total Bilirubin 0.3 - 1.2 mg/dL 0.4  0.4    Alkaline Phos 38 - 126 U/L 67  60    AST 15 - 41 U/L 29  39    ALT 0 - 44 U/L 18  31     Immature platelet fraction 6.9$   RADIOGRAPHIC STUDIES: I have personally reviewed the radiological images as listed and agreed with the findings in the report. US Venous Img Lower Unilateral Right  Result Date: 08/20/2022 CLINICAL DATA:  Leg swelling. EXAM: RIGHT LOWER EXTREMITY VENOUS DOPPLER ULTRASOUND TECHNIQUE: Gray-scale sonography with compression, as well as color and duplex ultrasound, were performed to evaluate the deep venous system(s) from the level of the common femoral vein through the popliteal and proximal calf veins. COMPARISON:  Duplex 06/13/2022 FINDINGS: VENOUS Normal compressibility of the common femoral, superficial femoral, and popliteal veins, as well as the visualized calf veins. Visualized portions of profunda femoral vein and great saphenous vein unremarkable. No filling defects to suggest DVT on grayscale or color Doppler imaging. Doppler waveforms show normal direction of venous flow, normal respiratory plasticity and response to augmentation. Limited views of the contralateral common femoral vein are unremarkable. OTHER None. Limitations: none IMPRESSION: No evidence of right lower extremity DVT. Electronically Signed   By: Narda Rutherford M.D.   On: 08/20/2022 15:16   DG Tibia/Fibula Right  Result Date: 08/20/2022 CLINICAL DATA:  Leg wound. EXAM: RIGHT TIBIA AND FIBULA - 2 VIEW COMPARISON:  None Available. FINDINGS: No  fracture or dislocation. Preserved bone mineralization. There are degenerative changes of the knee particularly involving the medial compartment. There is sclerosis and loss of roundness to the medial femoral condyle. Please correlate for an osteochondral abnormality. No soft tissue gas identified or bony erosive changes IMPRESSION: No acute osseous abnormality. No definite bony erosive changes or soft tissue gas. If there is further concern of bone infection, bone scan or MRI could be considered as clinically appropriate for further sensitivity. Degenerative changes of the knee joint particularly the medial compartment Electronically Signed   By: Karen Kays M.D.   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 Immature platelet fraction of 37.2% suggesting excellent bone marrow response. Use of high doses of ibuprofen over the last 5 days could also be a concern. Normal TSH B12 and folate levels Monospot test negative All of the patients questions were answered with apparent satisfaction. The patient knows to call the clinic with any problems, questions or concerns. CT abdomen pelvis done today shows no hepatosplenomegaly or signs of chronic liver disease.   #2 right lower extremity cellulitis following injury to the right leg with skin breach.-On ceftriaxone and vancomycin per hospitalist.  #3 hypertension #4 coronary disease status post remote history of PCI x 2 #5 obesity  PLAN: -Discussed lab results from 09/18/2022 with the patient. CBC shows decreased platelets of 97 K. CMP is stable overall. Immature platelets fraction at 9.2%. -Discussed that the patient will need treatment if platelets continues to drop below 50 K. Discussed the treatment options with the  patient. -Discussed that we need lab results in 2 weeks and 4 weeks.   -continue to monitor with labs every  2 weeks -continue to use compression socks to keep fluid levels in lower extremities low and reduce any risk of infection   FOLLOW-UP: Phone visit in 2 weeks Labs 1 day prior to phone visit    The total time spent in the appointment was 15 minutes* .  All of the patient's questions were answered with apparent satisfaction. The patient knows to call the clinic with any problems, questions or concerns.   Wyvonnia Lora MD MS AAHIVMS [facility] Hematology/Oncology Physician [facility]  .*Total Encounter Time as defined by the Centers for Medicare and Medicaid Services includes, in addition to the face-to-face time of a patient visit (documented in the note above) non-face-to-face time: obtaining and reviewing outside history, ordering and reviewing medications, tests or procedures, care coordination (communications with other health care professionals or caregivers) and documentation in the medical record.   I, Ok Edwards, am acting as a Neurosurgeon for Wyvonnia Lora, MD. .I have reviewed the above documentation for accuracy and completeness, and I agree with the above. [name] MD

## 2022-09-25 ENCOUNTER — Encounter: Payer: Self-pay | Admitting: Hematology and Oncology

## 2022-09-29 ENCOUNTER — Other Ambulatory Visit: Payer: Self-pay

## 2022-09-29 DIAGNOSIS — D693 Immune thrombocytopenic purpura: Secondary | ICD-10-CM

## 2022-10-02 ENCOUNTER — Other Ambulatory Visit: Payer: Self-pay

## 2022-10-02 ENCOUNTER — Inpatient Hospital Stay: Attending: Hematology

## 2022-10-02 DIAGNOSIS — Z79899 Other long term (current) drug therapy: Secondary | ICD-10-CM | POA: Diagnosis not present

## 2022-10-02 DIAGNOSIS — I252 Old myocardial infarction: Secondary | ICD-10-CM | POA: Insufficient documentation

## 2022-10-02 DIAGNOSIS — D693 Immune thrombocytopenic purpura: Secondary | ICD-10-CM | POA: Diagnosis not present

## 2022-10-02 DIAGNOSIS — I10 Essential (primary) hypertension: Secondary | ICD-10-CM | POA: Diagnosis not present

## 2022-10-02 DIAGNOSIS — Z8711 Personal history of peptic ulcer disease: Secondary | ICD-10-CM | POA: Diagnosis not present

## 2022-10-02 DIAGNOSIS — Z800 Family history of malignant neoplasm of digestive organs: Secondary | ICD-10-CM | POA: Insufficient documentation

## 2022-10-02 LAB — CBC WITH DIFFERENTIAL (CANCER CENTER ONLY)
Abs Immature Granulocytes: 0.01 10*3/uL (ref 0.00–0.07)
Basophils Absolute: 0.1 10*3/uL (ref 0.0–0.1)
Basophils Relative: 1 %
Eosinophils Absolute: 0.3 10*3/uL (ref 0.0–0.5)
Eosinophils Relative: 5 %
HCT: 40.4 % (ref 39.0–52.0)
Hemoglobin: 13.4 g/dL (ref 13.0–17.0)
Immature Granulocytes: 0 %
Lymphocytes Relative: 28 %
Lymphs Abs: 1.7 10*3/uL (ref 0.7–4.0)
MCH: 28.9 pg (ref 26.0–34.0)
MCHC: 33.2 g/dL (ref 30.0–36.0)
MCV: 87.3 fL (ref 80.0–100.0)
Monocytes Absolute: 0.6 10*3/uL (ref 0.1–1.0)
Monocytes Relative: 10 %
Neutro Abs: 3.4 10*3/uL (ref 1.7–7.7)
Neutrophils Relative %: 56 %
Platelet Count: 89 10*3/uL — ABNORMAL LOW (ref 150–400)
RBC: 4.63 MIL/uL (ref 4.22–5.81)
RDW: 15.6 % — ABNORMAL HIGH (ref 11.5–15.5)
WBC Count: 6 10*3/uL (ref 4.0–10.5)
nRBC: 0 % (ref 0.0–0.2)

## 2022-10-02 LAB — CMP (CANCER CENTER ONLY)
ALT: 19 U/L (ref 0–44)
AST: 33 U/L (ref 15–41)
Albumin: 3.8 g/dL (ref 3.5–5.0)
Alkaline Phosphatase: 67 U/L (ref 38–126)
Anion gap: 5 (ref 5–15)
BUN: 12 mg/dL (ref 8–23)
CO2: 24 mmol/L (ref 22–32)
Calcium: 8.8 mg/dL — ABNORMAL LOW (ref 8.9–10.3)
Chloride: 111 mmol/L (ref 98–111)
Creatinine: 0.8 mg/dL (ref 0.61–1.24)
GFR, Estimated: >60 mL/min (ref >60)
Glucose, Bld: 98 mg/dL (ref 70–99)
Potassium: 4.2 mmol/L (ref 3.5–5.1)
Sodium: 140 mmol/L (ref 135–145)
Total Bilirubin: 0.6 mg/dL (ref 0.3–1.2)
Total Protein: 6.6 g/dL (ref 6.5–8.1)

## 2022-10-02 LAB — IMMATURE PLATELET FRACTION: Immature Platelet Fraction: 11.1 % — ABNORMAL HIGH (ref 1.2–8.6)

## 2022-10-03 ENCOUNTER — Inpatient Hospital Stay (HOSPITAL_BASED_OUTPATIENT_CLINIC_OR_DEPARTMENT_OTHER): Admitting: Hematology

## 2022-10-03 DIAGNOSIS — D693 Immune thrombocytopenic purpura: Secondary | ICD-10-CM | POA: Diagnosis not present

## 2022-10-03 NOTE — Progress Notes (Signed)
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 male who has been referred to [place] by Dr Bradley Ferris MD for evaluation and management of thrombocytopenia.   Patient has a history of hypertension, dyslipidemia, coronary disease status post MI with 2 stents placed more than 5 years ago, remote history of gastric ulcer.   Patient presented to the emergency room with right lower extremity pain and warmth redness and increasing discomfort.  He notes that he had had 2 traumas to the same area on his right leg after which he started having leg pain and swelling.  He was started on doxycycline as outpatient and noted that this failed to resolve his leg pain which was worsening and therefore he came to the emergency room.   In the emergency room the patient was noted to have severe right lower extremity cellulitis and was started on IV ceftriaxone and vancomycin. 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  .I connected with [name] on 10/03/2022 at  1:00 PM EDT by telephone visit and verified that I am speaking with the correct person using two identifiers.   I had a phone visit with the patient on 09/19/2022 and he was doing well overall.   Patient reports he has been doing well overall without any new or severe medical concerns. He denies any infection issues or injuries.   He denies fever, chills, night sweats, abnormal bleeding issues, abdominal pain, chest pain, back pain, or leg swelling.   We  discussed lab results from 10/02/2022 in detail with the patient.   I discussed the limitations, risks, security and privacy concerns of performing an evaluation and management service by telemedicine and the availability of in-person appointments. I also discussed with the patient that there may be a patient responsible charge related to this service. The patient expressed understanding and agreed to proceed.   Other persons participating in the visit and their role in the encounter: None   Patient's location: Home  Provider's location: [facility]   Chief Complaint: Acute ITP    MEDICAL HISTORY:  Past Medical History:  Diagnosis Date   Coronary artery disease    Gastric ulcer    Heart attack (HCC)    Hypercholesteremia    Hypertension     SURGICAL HISTORY: Past Surgical History:  Procedure Laterality Date   CORONARY ANGIOPLASTY WITH STENT PLACEMENT     TONSILLECTOMY      SOCIAL HISTORY: Social History   Socioeconomic History   Marital status: Single    Spouse name: Not on file   Number of children: Not on file   Years of education: Not on file   Highest education level: Not on file  Occupational History   Occupation: Ecologist: Production assistant, radio FOR SELF EMPLOYED    Comment: Retired from [employer]  Tobacco Use   Smoking status: Never   Smokeless tobacco: Never  Vaping Use   Vaping Use: Never used  Substance and Sexual Activity   Alcohol use: No    Comment: "Quit 24 years ago"  Drug use: No   Sexual activity: Not Currently  Other Topics Concern   Not on file  Social History Narrative   Not on file   Social Determinants of Health   Financial Resource Strain: Not on file  Food Insecurity: No Food Insecurity (08/21/2022)   Hunger Vital Sign    Worried About Running Out of Food in the Last Year: Never true    Ran Out of Food in the Last Year: Never true  Transportation Needs: No Transportation Needs (08/21/2022)   PRAPARE - Scientist, research (physical sciences) (Medical): No    Lack of Transportation (Non-Medical): No  Physical Activity: Not on file  Stress: Not on file  Social Connections: Not on file  Intimate Partner Violence: Not At Risk (08/21/2022)   Humiliation, Afraid, Rape, and Kick questionnaire    Fear of Current or Ex-Partner: No    Emotionally Abused: No    Physically Abused: No    Sexually Abused: No    FAMILY HISTORY: Family History  Problem Relation Age of Onset   Dementia Mother    Cancer Father    Diabetes Father    Emphysema Father    Colon cancer Father    Hypertension Brother     ALLERGIES:  is allergic to ampicillin.  MEDICATIONS:  Current Outpatient Medications  Medication Sig Dispense Refill   acetaminophen (TYLENOL) 500 MG tablet Take 500 mg by mouth 2 (two) times daily as needed for mild pain or headache.     atorvastatin (LIPITOR) 80 MG tablet Take 80 mg by mouth every evening.     carvedilol (COREG) 12.5 MG tablet Take 12.5 mg by mouth 2 (two) times daily.     Emollient (CETAPHIL) cream Apply 1 Application topically daily as needed (for dryness- affected areas).     lisinopril (ZESTRIL) 10 MG tablet Take 10 mg by mouth daily.     Multiple Vitamins-Minerals (CENTRUM SILVER 50+MEN PO) Take 1 tablet by mouth daily with breakfast.     silver sulfADIAZINE (SILVADENE) 1 % cream Apply topically daily. 50 g 0   No current facility-administered medications for this visit.    REVIEW OF SYSTEMS:    10 Point review of Systems was done is negative except as noted above.   PHYSICAL EXAMINATION: TELE-MED VISIT LABORATORY DATA:  I have reviewed the data as listed .    Latest Ref Rng & Units 10/02/2022    8:12 AM 09/18/2022   10:41 AM 09/06/2022   10:54 AM  CBC  WBC 4.0 - 10.5 K/uL 6.0  6.1  5.9   Hemoglobin 13.0 - 17.0 g/dL 09.8  11.9  14.7   Hematocrit 39.0 - 52.0 % 40.4  39.5  40.3   Platelets 150 - 400 K/uL 89  97  170    .    Latest Ref Rng & Units 10/02/2022    8:12 AM 09/18/2022   10:41  AM 09/06/2022   10:54 AM  CMP  Glucose 70 - 99 mg/dL 98  82  87   BUN 8 - 23 mg/dL 12  16  20    Creatinine 0.61 - 1.24 mg/dL 8.29  5.62  1.30   Sodium 135 - 145 mmol/L 140  140  136   Potassium 3.5 - 5.1 mmol/L 4.2  4.7  4.6   Chloride 98 - 111 mmol/L 111  108  108   CO2 22 - 32 mmol/L 24  27  25    Calcium 8.9 - 10.3 mg/dL  8.8  8.8  8.9   Total Protein 6.5 - 8.1 g/dL 6.6  6.6  7.0   Total Bilirubin 0.3 - 1.2 mg/dL 0.6  0.4  0.4   Alkaline Phos 38 - 126 U/L 67  67  60   AST 15 - 41 U/L 33  29  39   ALT 0 - 44 U/L 19  18  31     Immature platelet fraction 6.9$   RADIOGRAPHIC STUDIES: I have personally reviewed the radiological images as listed and agreed with the findings in the report. No results found.  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 Immature platelet fraction of 37.2% suggesting excellent bone marrow response. Use of high doses of ibuprofen over the last 5 days could also be a concern. Normal TSH B12 and folate levels Monospot test negative All of the patients questions were answered with apparent satisfaction. The patient knows to call the clinic with any problems, questions or concerns. CT abdomen pelvis done today shows no hepatosplenomegaly or signs of chronic liver disease.   #2 right lower extremity cellulitis following injury to the right leg with skin breach.-On ceftriaxone and vancomycin per hospitalist.  #3 hypertension #4 coronary disease status post remote history of PCI x 2 #5 obesity  PLAN: -Discussed lab results from 10/02/2022 with the patient. CBC shows decreased platelets at 89 K. CMP is stable. Immature platelets fraction elevated at 11.1%. -Discussed that treatment will be needed if platelet counts drops below 50 K. Discussed the treatment  options as well.  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  -Discussed that we need lab results in 2 weeks and 4 weeks.   -continue to monitor with labs every 2 weeks -continue to use compression socks to keep fluid levels in lower extremities low and reduce any risk of infection     FOLLOW-UP: Phone visit in 2 weeks Labs 1 day prior to phone visi  The total time spent in the appointment was 12 minutes* .  All of the patient's questions were answered with apparent satisfaction. The patient knows to call the clinic with any problems, questions or concerns.   Wyvonnia Lora MD MS AAHIVMS [facility] Hematology/Oncology Physician [facility]  .*Total Encounter Time as defined by the Centers for Medicare and Medicaid Services includes, in addition to the face-to-face time of a patient visit (documented in the note above) non-face-to-face time: obtaining and reviewing outside history, ordering and reviewing medications, tests or procedures, care coordination (communications with other health care professionals or caregivers) and documentation in the medical record.  I, Ok Edwards, am acting as a Neurosurgeon for Wyvonnia Lora, MD. .I have reviewed the above documentation for accuracy and completeness, and I agree with the above. [name] MD

## 2022-10-04 ENCOUNTER — Telehealth: Payer: Self-pay | Admitting: Hematology

## 2022-10-09 ENCOUNTER — Encounter: Payer: Self-pay | Admitting: Hematology and Oncology

## 2022-10-18 ENCOUNTER — Other Ambulatory Visit: Payer: Self-pay

## 2022-10-18 DIAGNOSIS — D693 Immune thrombocytopenic purpura: Secondary | ICD-10-CM

## 2022-10-19 ENCOUNTER — Other Ambulatory Visit: Payer: Self-pay

## 2022-10-19 ENCOUNTER — Inpatient Hospital Stay

## 2022-10-19 DIAGNOSIS — D693 Immune thrombocytopenic purpura: Secondary | ICD-10-CM | POA: Diagnosis not present

## 2022-10-19 LAB — CBC WITH DIFFERENTIAL (CANCER CENTER ONLY)
Abs Immature Granulocytes: 0.02 10*3/uL (ref 0.00–0.07)
Basophils Absolute: 0.1 10*3/uL (ref 0.0–0.1)
Basophils Relative: 1 %
Eosinophils Absolute: 0.4 10*3/uL (ref 0.0–0.5)
Eosinophils Relative: 6 %
HCT: 42.5 % (ref 39.0–52.0)
Hemoglobin: 13.7 g/dL (ref 13.0–17.0)
Immature Granulocytes: 0 %
Lymphocytes Relative: 25 %
Lymphs Abs: 1.7 10*3/uL (ref 0.7–4.0)
MCH: 29.1 pg (ref 26.0–34.0)
MCHC: 32.2 g/dL (ref 30.0–36.0)
MCV: 90.2 fL (ref 80.0–100.0)
Monocytes Absolute: 0.5 10*3/uL (ref 0.1–1.0)
Monocytes Relative: 8 %
Neutro Abs: 4 10*3/uL (ref 1.7–7.7)
Neutrophils Relative %: 60 %
Platelet Count: 75 10*3/uL — ABNORMAL LOW (ref 150–400)
RBC: 4.71 MIL/uL (ref 4.22–5.81)
RDW: 15.6 % — ABNORMAL HIGH (ref 11.5–15.5)
WBC Count: 6.7 10*3/uL (ref 4.0–10.5)
nRBC: 0 % (ref 0.0–0.2)

## 2022-10-19 LAB — CMP (CANCER CENTER ONLY)
ALT: 19 U/L (ref 0–44)
AST: 28 U/L (ref 15–41)
Albumin: 3.7 g/dL (ref 3.5–5.0)
Alkaline Phosphatase: 69 U/L (ref 38–126)
Anion gap: 5 (ref 5–15)
BUN: 16 mg/dL (ref 8–23)
CO2: 29 mmol/L (ref 22–32)
Calcium: 8.9 mg/dL (ref 8.9–10.3)
Chloride: 109 mmol/L (ref 98–111)
Creatinine: 0.85 mg/dL (ref 0.61–1.24)
GFR, Estimated: >60 mL/min (ref >60)
Glucose, Bld: 80 mg/dL (ref 70–99)
Potassium: 4.2 mmol/L (ref 3.5–5.1)
Sodium: 143 mmol/L (ref 135–145)
Total Bilirubin: 0.5 mg/dL (ref 0.3–1.2)
Total Protein: 6 g/dL — ABNORMAL LOW (ref 6.5–8.1)

## 2022-10-19 LAB — IMMATURE PLATELET FRACTION: Immature Platelet Fraction: 11.4 % — ABNORMAL HIGH (ref 1.2–8.6)

## 2022-10-20 ENCOUNTER — Inpatient Hospital Stay (HOSPITAL_BASED_OUTPATIENT_CLINIC_OR_DEPARTMENT_OTHER): Admitting: Hematology

## 2022-10-20 DIAGNOSIS — D693 Immune thrombocytopenic purpura: Secondary | ICD-10-CM | POA: Diagnosis not present

## 2022-10-20 NOTE — Progress Notes (Signed)
HEMATOLOGY/ONCOLOGY PHONE VISIT NOTE  Date of Service: 10/20/2022  Patient Care Team: Joel Milliner, MD as PCP - General (Cardiology) Joel Maine, MD as Consulting Physician (Hematology)  CHIEF COMPLAINTS/PURPOSE OF CONSULTATION:  Evaluation and management of ITP  HISTORY OF PRESENTING ILLNESS:  Joel Hoffman is a wonderful 62 y.o. male who has been referred to [place] by Dr Joel Ferris MD for evaluation and management of thrombocytopenia.   Patient has a history of hypertension, dyslipidemia, coronary disease status post MI with 2 stents placed more than 5 years ago, remote history of gastric ulcer.   Patient presented to the emergency room with right lower extremity pain and warmth redness and increasing discomfort.  He notes that he had had 2 traumas to the same area on his right leg after which he started having leg pain and swelling.  He was started on doxycycline as outpatient and noted that this failed to resolve his leg pain which was worsening and therefore he came to the emergency room.   In the emergency room the patient was noted to have severe right lower extremity cellulitis and was started on IV ceftriaxone and vancomycin. 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  I had a phone visit with the patient on 10/03/2022 and he was doing well overall without any new medical concerns.   I connected with Joel Hoffman on 10/20/22 at  8:40 AM EDT by telephone visit and verified that I am speaking with the correct person using two identifiers.   I discussed the limitations, risks, security and privacy concerns of performing an evaluation and management service by telemedicine and the availability of in-person appointments. I also discussed with the patient that there may be a patient  responsible charge related to this service. The patient expressed understanding and agreed to proceed.   Other persons participating in the visit and their role in the encounter: none   Patient's location: home  Provider's location: [facility]   Chief Complaint: Evaluation and management of Acute ITP    Today, patient reports that he is feeling well overall. He has no new bleeding issues and no other acute new symptoms.   MEDICAL HISTORY:  Past Medical History:  Diagnosis Date   Coronary artery disease    Gastric ulcer    Heart attack (HCC)    Hypercholesteremia    Hypertension     SURGICAL HISTORY: Past Surgical History:  Procedure Laterality Date   CORONARY ANGIOPLASTY WITH STENT PLACEMENT     TONSILLECTOMY      SOCIAL HISTORY: Social History   Socioeconomic History   Marital status: Single    Spouse name: Not on file   Number of children: Not on file   Years of education: Not on file   Highest education level: Not on file  Occupational History   Occupation: Ecologist: Production assistant, radio FOR SELF EMPLOYED    Comment: Retired from [employer]  Tobacco Use   Smoking status: Never   Smokeless tobacco: Never  Vaping Use   Vaping Use: Never used  Substance and Sexual Activity   Alcohol use: No    Comment: "Quit 24 years ago"   Drug use: No   Sexual activity: Not Currently  Other Topics Concern   Not on file  Social History Narrative  Not on file   Social Determinants of Health   Financial Resource Strain: Not on file  Food Insecurity: No Food Insecurity (08/21/2022)   Hunger Vital Sign    Worried About Running Out of Food in the Last Year: Never true    Ran Out of Food in the Last Year: Never true  Transportation Needs: No Transportation Needs (08/21/2022)   PRAPARE - Administrator, Civil Service (Medical): No    Lack of Transportation (Non-Medical): No  Physical Activity: Not on file  Stress: Not on file  Social Connections: Not on file   Intimate Partner Violence: Not At Risk (08/21/2022)   Humiliation, Afraid, Rape, and Kick questionnaire    Fear of Current or Ex-Partner: No    Emotionally Abused: No    Physically Abused: No    Sexually Abused: No    FAMILY HISTORY: Family History  Problem Relation Age of Onset   Dementia Mother    Cancer Father    Diabetes Father    Emphysema Father    Colon cancer Father    Hypertension Brother     ALLERGIES:  is allergic to ampicillin.  MEDICATIONS:  Current Outpatient Medications  Medication Sig Dispense Refill   acetaminophen (TYLENOL) 500 MG tablet Take 500 mg by mouth 2 (two) times daily as needed for mild pain or headache.     atorvastatin (LIPITOR) 80 MG tablet Take 80 mg by mouth every evening.     carvedilol (COREG) 12.5 MG tablet Take 12.5 mg by mouth 2 (two) times daily.     Emollient (CETAPHIL) cream Apply 1 Application topically daily as needed (for dryness- affected areas).     lisinopril (ZESTRIL) 10 MG tablet Take 10 mg by mouth daily.     Multiple Vitamins-Minerals (CENTRUM SILVER 50+MEN PO) Take 1 tablet by mouth daily with breakfast.     silver sulfADIAZINE (SILVADENE) 1 % cream Apply topically daily. 50 g 0   No current facility-administered medications for this visit.    REVIEW OF SYSTEMS:    10 Point review of Systems was done is negative except as noted above.   PHYSICAL EXAMINATION: TELE-MED VISIT  LABORATORY DATA:  I have reviewed the data as listed .    Latest Ref Rng & Units 10/19/2022    8:04 AM 10/02/2022    8:12 AM 09/18/2022   10:41 AM  CBC  WBC 4.0 - 10.5 K/uL 6.7  6.0  6.1   Hemoglobin 13.0 - 17.0 g/dL 16.1  09.6  04.5   Hematocrit 39.0 - 52.0 % 42.5  40.4  39.5   Platelets 150 - 400 K/uL 75  89  97    .    Latest Ref Rng & Units 10/19/2022    8:04 AM 10/02/2022    8:12 AM 09/18/2022   10:41 AM  CMP  Glucose 70 - 99 mg/dL 80  98  82   BUN 8 - 23 mg/dL 16  12  16    Creatinine 0.61 - 1.24 mg/dL 4.09  8.11  9.14   Sodium 135 -  145 mmol/L 143  140  140   Potassium 3.5 - 5.1 mmol/L 4.2  4.2  4.7   Chloride 98 - 111 mmol/L 109  111  108   CO2 22 - 32 mmol/L 29  24  27    Calcium 8.9 - 10.3 mg/dL 8.9  8.8  8.8   Total Protein 6.5 - 8.1 g/dL 6.0  6.6  6.6   Total  Bilirubin 0.3 - 1.2 mg/dL 0.5  0.6  0.4   Alkaline Phos 38 - 126 U/L 69  67  67   AST 15 - 41 U/L 28  33  29   ALT 0 - 44 U/L 19  19  18     Immature platelet fraction 6.9$   RADIOGRAPHIC STUDIES: I have personally reviewed the radiological images as listed and agreed with the findings in the report. No results found.  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 Immature platelet fraction of 37.2% suggesting excellent bone marrow response. Use of high doses of ibuprofen over the last 5 days could also be a concern. Normal TSH B12 and folate levels Monospot test negative All of the patients questions were answered with apparent satisfaction. The patient knows to call the clinic with any problems, questions or concerns. CT abdomen pelvis done today shows no hepatosplenomegaly or signs of chronic liver disease.   #2 right lower extremity cellulitis following injury to the right leg with skin breach.-On ceftriaxone and vancomycin per hospitalist.  #3 hypertension #4 coronary disease status post remote history of PCI x 2 #5 obesity  PLAN:  -Discussed lab results from 10/19/2022 in detail with patient. CBC showed WBC of 6.7K, hemoglobin of 13.7, and platelets low at 75K. -It is possible that patient does endorse chronic ITP at this time if he has endorsed low platelets for at least 3 months -low platelets may be related to acute cellulitis or chronic ITP -Platelet levels continue to gradually decrease over time. Platelets were previously at 97K on  09/18/2022, 89K on 10/02/2022, and has been as low as 5K in the past. Will continue to monitor closely with labs in 4 weeks -No indication to initiate treatment at this time. If platelets drop below 50K, then will consider treatment options  FOLLOW-UP: Phone visit with Dr Candise Che in about 4 weeks Labs 1 day prior to phone visit  The total time spent in the appointment was 12 minutes* .  All of the patient's questions were answered with apparent satisfaction. The patient knows to call the clinic with any problems, questions or concerns.   Wyvonnia Lora MD MS AAHIVMS [facility] Hematology/Oncology Physician [facility]  .*Total Encounter Time as defined by the Centers for Medicare and Medicaid Services includes, in addition to the face-to-face time of a patient visit (documented in the note above) non-face-to-face time: obtaining and reviewing outside history, ordering and reviewing medications, tests or procedures, care coordination (communications with other health care professionals or caregivers) and documentation in the medical record.    I,Mitra Faeizi,acting as a Neurosurgeon for Wyvonnia Lora, MD.,have documented all relevant documentation on the behalf of Wyvonnia Lora, MD,as directed by  Wyvonnia Lora, MD while in the presence of Wyvonnia Lora, MD.  .I have reviewed the above documentation for accuracy and completeness, and I agree with the above. Joel Maine MD

## 2022-10-23 ENCOUNTER — Telehealth: Payer: Self-pay | Admitting: Hematology

## 2022-10-26 ENCOUNTER — Encounter: Payer: Self-pay | Admitting: Hematology and Oncology

## 2022-12-06 ENCOUNTER — Other Ambulatory Visit: Payer: Self-pay

## 2022-12-06 DIAGNOSIS — D693 Immune thrombocytopenic purpura: Secondary | ICD-10-CM

## 2022-12-07 ENCOUNTER — Inpatient Hospital Stay: Attending: Hematology

## 2022-12-07 ENCOUNTER — Other Ambulatory Visit: Payer: Self-pay

## 2022-12-07 DIAGNOSIS — D693 Immune thrombocytopenic purpura: Secondary | ICD-10-CM | POA: Insufficient documentation

## 2022-12-07 DIAGNOSIS — Z79899 Other long term (current) drug therapy: Secondary | ICD-10-CM | POA: Insufficient documentation

## 2022-12-07 DIAGNOSIS — E669 Obesity, unspecified: Secondary | ICD-10-CM | POA: Insufficient documentation

## 2022-12-07 DIAGNOSIS — L03115 Cellulitis of right lower limb: Secondary | ICD-10-CM | POA: Insufficient documentation

## 2022-12-07 DIAGNOSIS — I10 Essential (primary) hypertension: Secondary | ICD-10-CM | POA: Diagnosis not present

## 2022-12-07 DIAGNOSIS — Z5111 Encounter for antineoplastic chemotherapy: Secondary | ICD-10-CM | POA: Diagnosis present

## 2022-12-07 LAB — CBC WITH DIFFERENTIAL (CANCER CENTER ONLY)
Abs Immature Granulocytes: 0.02 10*3/uL (ref 0.00–0.07)
Basophils Absolute: 0.1 10*3/uL (ref 0.0–0.1)
Basophils Relative: 1 %
Eosinophils Absolute: 0.4 10*3/uL (ref 0.0–0.5)
Eosinophils Relative: 6 %
HCT: 45.3 % (ref 39.0–52.0)
Hemoglobin: 15.1 g/dL (ref 13.0–17.0)
Immature Granulocytes: 0 %
Lymphocytes Relative: 25 %
Lymphs Abs: 1.7 10*3/uL (ref 0.7–4.0)
MCH: 30.1 pg (ref 26.0–34.0)
MCHC: 33.3 g/dL (ref 30.0–36.0)
MCV: 90.4 fL (ref 80.0–100.0)
Monocytes Absolute: 0.6 10*3/uL (ref 0.1–1.0)
Monocytes Relative: 8 %
Neutro Abs: 4.1 10*3/uL (ref 1.7–7.7)
Neutrophils Relative %: 60 %
Platelet Count: 44 10*3/uL — ABNORMAL LOW (ref 150–400)
RBC: 5.01 MIL/uL (ref 4.22–5.81)
RDW: 14.4 % (ref 11.5–15.5)
WBC Count: 6.9 10*3/uL (ref 4.0–10.5)
nRBC: 0 % (ref 0.0–0.2)

## 2022-12-07 LAB — CMP (CANCER CENTER ONLY)
ALT: 20 U/L (ref 0–44)
AST: 30 U/L (ref 15–41)
Albumin: 4.3 g/dL (ref 3.5–5.0)
Alkaline Phosphatase: 79 U/L (ref 38–126)
Anion gap: 7 (ref 5–15)
BUN: 17 mg/dL (ref 8–23)
CO2: 32 mmol/L (ref 22–32)
Calcium: 9.5 mg/dL (ref 8.9–10.3)
Chloride: 108 mmol/L (ref 98–111)
Creatinine: 0.91 mg/dL (ref 0.61–1.24)
GFR, Estimated: >60 mL/min (ref >60)
Glucose, Bld: 81 mg/dL (ref 70–99)
Potassium: 5.3 mmol/L — ABNORMAL HIGH (ref 3.5–5.1)
Sodium: 147 mmol/L — ABNORMAL HIGH (ref 135–145)
Total Bilirubin: 0.6 mg/dL (ref 0.3–1.2)
Total Protein: 6.9 g/dL (ref 6.5–8.1)

## 2022-12-07 LAB — IMMATURE PLATELET FRACTION: Immature Platelet Fraction: 15.4 % — ABNORMAL HIGH (ref 1.2–8.6)

## 2022-12-08 ENCOUNTER — Encounter: Payer: Self-pay | Admitting: Hematology

## 2022-12-08 ENCOUNTER — Inpatient Hospital Stay (HOSPITAL_BASED_OUTPATIENT_CLINIC_OR_DEPARTMENT_OTHER): Admitting: Hematology

## 2022-12-08 DIAGNOSIS — D693 Immune thrombocytopenic purpura: Secondary | ICD-10-CM

## 2022-12-08 DIAGNOSIS — Z5111 Encounter for antineoplastic chemotherapy: Secondary | ICD-10-CM | POA: Diagnosis not present

## 2022-12-08 MED ORDER — ONDANSETRON HCL 8 MG PO TABS: 8.0000 mg | ORAL_TABLET | Freq: Three times a day (TID) | ORAL | 0 refills | Status: DC | PRN

## 2022-12-08 NOTE — Progress Notes (Signed)
START ON PATHWAY REGIMEN - Immune Thrombocytopenia (ITP)     A cycle is every 7 days:     Rituximab-xxxx   **Always confirm dose/schedule in your pharmacy ordering system**  Patient Characteristics: Relapsed/Refractory, Rapid Response Not Needed, Second Line, Time to Relapse  < 6 Months, Not a Candidate for Further Steroid Therapy Disease Classification: Relapsed / Refractory Line of Therapy: Second Line Time to Relapse: < 6 Months Candidate for Further Steroid Therapy<= No Intent of Therapy: Curative Intent, Discussed with Patient

## 2022-12-08 NOTE — Progress Notes (Signed)
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 male who has been referred to [place] by Dr Bradley Ferris MD for evaluation and management of thrombocytopenia.   Patient has a history of hypertension, dyslipidemia, coronary disease status post MI with 2 stents placed more than 5 years ago, remote history of gastric ulcer.   Patient presented to the emergency room with right lower extremity pain and warmth redness and increasing discomfort.  He notes that he had had 2 traumas to the same area on his right leg after which he started having leg pain and swelling.  He was started on doxycycline as outpatient and noted that this failed to resolve his leg pain which was worsening and therefore he came to the emergency room.   In the emergency room the patient was noted to have severe right lower extremity cellulitis and was started on IV ceftriaxone and vancomycin. 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  I had a phone visit with the patient on 10/20/2022 and he was doing well overall with no new medical concerns.   I connected with Joel Hoffman on 12/08/22 at  8:40 AM EDT by telephone visit and verified that I am speaking with the correct person using two identifiers.   I discussed the limitations, risks, security and privacy concerns of performing an evaluation and management service by telemedicine and the availability of in-person appointments. I also discussed with the patient that there may be a patient  responsible charge related to this service. The patient expressed understanding and agreed to proceed.   Other persons participating in the visit and their role in the encounter: none   Patient's location: home  Provider's location: [facility]   Chief Complaint: Evaluation and management of ITP     Today, he reports that he is doing well overall with no new medical concerns. He denies any other injuries of infections. The results of his recent lab workup was discussed with him in detail.   MEDICAL HISTORY:  Past Medical History:  Diagnosis Date   Coronary artery disease    Gastric ulcer    Heart attack (HCC)    Hypercholesteremia    Hypertension     SURGICAL HISTORY: Past Surgical History:  Procedure Laterality Date   CORONARY ANGIOPLASTY WITH STENT PLACEMENT     TONSILLECTOMY      SOCIAL HISTORY: Social History   Socioeconomic History   Marital status: Single    Spouse name: Not on file   Number of children: Not on file   Years of education: Not on file   Highest education level: Not on file  Occupational History   Occupation: Ecologist: Production assistant, radio FOR SELF EMPLOYED    Comment: Retired from [employer]  Tobacco Use   Smoking status: Never   Smokeless tobacco: Never  Vaping Use   Vaping status: Never Used  Substance and Sexual Activity   Alcohol use: No    Comment: "Quit 24 years ago"   Drug use: No   Sexual activity: Not Currently  Other Topics Concern   Not on file  Social History Narrative   Not on file   Social Determinants of Health   Financial Resource Strain: Not on file  Food Insecurity: No Food Insecurity (08/21/2022)   Hunger Vital Sign    Worried About Running Out of Food in the Last Year: Never true    Ran Out of Food in the Last Year: Never true  Transportation Needs: No Transportation Needs (08/21/2022)   PRAPARE - Administrator, Civil Service (Medical): No    Lack of Transportation (Non-Medical): No  Physical Activity:  Not on file  Stress: Not on file  Social Connections: Not on file  Intimate Partner Violence: Not At Risk (08/21/2022)   Humiliation, Afraid, Rape, and Kick questionnaire    Fear of Current or Ex-Partner: No    Emotionally Abused: No    Physically Abused: No    Sexually Abused: No    FAMILY HISTORY: Family History  Problem Relation Age of Onset   Dementia Mother    Cancer Father    Diabetes Father    Emphysema Father    Colon cancer Father    Hypertension Brother     ALLERGIES:  is allergic to ampicillin.  MEDICATIONS:  Current Outpatient Medications  Medication Sig Dispense Refill   acetaminophen (TYLENOL) 500 MG tablet Take 500 mg by mouth 2 (two) times daily as needed for mild pain or headache.     atorvastatin (LIPITOR) 80 MG tablet Take 80 mg by mouth every evening.     carvedilol (COREG) 12.5 MG tablet Take 12.5 mg by mouth 2 (two) times daily.     Emollient (CETAPHIL) cream Apply 1 Application topically daily as needed (for dryness- affected areas).     lisinopril (ZESTRIL) 10 MG tablet Take 10 mg by mouth daily.     Multiple Vitamins-Minerals (CENTRUM SILVER 50+MEN PO) Take 1 tablet by mouth daily with breakfast.     silver sulfADIAZINE (SILVADENE) 1 % cream Apply topically daily. 50 g 0   No current facility-administered medications for this visit.    REVIEW OF SYSTEMS:    10 Point review of Systems was done is negative except as noted above.   PHYSICAL EXAMINATION: TELE-MED VISIT  LABORATORY DATA:  I have reviewed the data as listed .    Latest Ref Rng & Units 12/07/2022    7:58 AM 10/19/2022    8:04 AM 10/02/2022    8:12 AM  CBC  WBC 4.0 - 10.5 K/uL 6.9  6.7  6.0   Hemoglobin 13.0 - 17.0 g/dL 91.4  78.2  95.6   Hematocrit 39.0 - 52.0 % 45.3  42.5  40.4   Platelets 150 - 400 K/uL 44  75  89    .    Latest Ref Rng & Units 12/07/2022    7:58 AM 10/19/2022    8:04 AM 10/02/2022    8:12 AM  CMP  Glucose 70 - 99 mg/dL 81  80  98   BUN 8 - 23 mg/dL 17  16   12    Creatinine 0.61 - 1.24 mg/dL 2.13  0.86  5.78   Sodium 135 - 145 mmol/L 147  143  140   Potassium 3.5 - 5.1 mmol/L 5.3  4.2  4.2   Chloride 98 - 111 mmol/L 108  109  111   CO2 22 - 32 mmol/L 32  29  24   Calcium 8.9 - 10.3 mg/dL 9.5  8.9  8.8  Total Protein 6.5 - 8.1 g/dL 6.9  6.0  6.6   Total Bilirubin 0.3 - 1.2 mg/dL 0.6  0.5  0.6   Alkaline Phos 38 - 126 U/L 79  69  67   AST 15 - 41 U/L 30  28  33   ALT 0 - 44 U/L 20  19  19     Immature platelet fraction 6.9$   RADIOGRAPHIC STUDIES: I have personally reviewed the radiological images as listed and agreed with the findings in the report. No results found.  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 Immature platelet fraction of 37.2% suggesting excellent bone marrow response. Use of high doses of ibuprofen over the last 5 days could also be a concern. Normal TSH B12 and folate levels Monospot test negative All of the patients questions were answered with apparent satisfaction. The patient knows to call the clinic with any problems, questions or concerns. CT abdomen pelvis done today shows no hepatosplenomegaly or signs of chronic liver disease.   #2 right lower extremity cellulitis following injury to the right leg with skin breach.-On ceftriaxone and vancomycin per hospitalist.  #3 hypertension #4 coronary disease status post remote history of PCI x 2 #5 obesity  PLAN:  -Discussed lab results from 12/07/2022 in detail with patient. CBC showed WBC of 6.9K, hemoglobin of 15.1, and platelets decreased to 44K. -platelets are continuing to gradually decrease. Platelets were previously 75K one month ago, 89K two months ago, and 97K previously.  -WBC and hemoglobin normal -immature platelet fraction shows bone marrow is  functioning normally -it has 6 months since patient was first admitted in February for his first injury with cellulitis. 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 -discussed details of treatment option, Rituxan antibody, to target lymphocytes which will be administered once a week for a total of four doses -discussed other treatment option of removing the spleen where platelets are being removed, which would not be recommended at this time -discussed third treatment option of a medication to stimulate the bone marrow to make more platelets -patient is agreeable to proceed with Rituxan at this time -will set up education session to discuss details of treatment -discussed potential risk of allergic reactions with Rituxan which would be treated -answered all of patient's questions regarding his platelets -recommend a well-balanced diet to optimize nutrition -recommend vitamin B complex, one tablet daily to ensure no that there are no subtle vitamin B deficiencies -advised patient to avoid NSAIDS that may affect platelet counts  FOLLOW-UP: Chemo counseling for rituximab weekly x 4 doses in 3 to 4 days. Starting weekly rituximab in 7 to 10 days (weekly x 4 doses) Labs with each treatment MD visit with second and fourth weekly doses of Rituxan for toxicity assessment.  The total time spent in the appointment was 30 minutes* .  All of the patient's questions were answered with apparent satisfaction. The patient knows to call the clinic with any problems, questions or concerns.   Wyvonnia Lora MD MS AAHIVMS [facility] Hematology/Oncology Physician [facility]  .*Total Encounter Time as defined by the Centers for Medicare and Medicaid Services includes, in addition to the face-to-face time of a patient visit (documented in the note above) non-face-to-face time: obtaining and reviewing  outside history, ordering and  reviewing medications, tests or procedures, care coordination (communications with other health care professionals or caregivers) and documentation in the medical record.    I,Mitra Faeizi,acting as a Neurosurgeon for Wyvonnia Lora, MD.,have documented all relevant documentation on the behalf of Wyvonnia Lora, MD,as directed by  Wyvonnia Lora, MD while in the presence of Wyvonnia Lora, MD.  .I have reviewed the above documentation for accuracy and completeness, and I agree with the above. [name] MD

## 2022-12-14 ENCOUNTER — Encounter: Payer: Self-pay | Admitting: Hematology

## 2022-12-14 ENCOUNTER — Encounter: Payer: Self-pay | Admitting: Hematology and Oncology

## 2022-12-17 NOTE — Progress Notes (Signed)
Pharmacist Chemotherapy Monitoring - Initial Assessment    Anticipated start date: 12/22/22   The following has been reviewed per standard work regarding the patient's treatment regimen: The patient's diagnosis, treatment plan and drug doses, and organ/hematologic function Lab orders and baseline tests specific to treatment regimen  The treatment plan start date, drug sequencing, and pre-medications Prior authorization status  Patient's documented medication list, including drug-drug interaction screen and prescriptions for anti-emetics and supportive care specific to the treatment regimen The drug concentrations, fluid compatibility, administration routes, and timing of the medications to be used The patient's access for treatment and lifetime cumulative dose history, if applicable  The patient's medication allergies and previous infusion related reactions, if applicable   Changes made to treatment plan:  N/A  Follow up needed:  Need Hep B panel done, not released from plan    Joel Hoffman, [facility], 12/17/2022  10:42 PM

## 2022-12-19 ENCOUNTER — Inpatient Hospital Stay

## 2022-12-21 ENCOUNTER — Other Ambulatory Visit: Payer: Self-pay

## 2022-12-21 DIAGNOSIS — D693 Immune thrombocytopenic purpura: Secondary | ICD-10-CM

## 2022-12-22 ENCOUNTER — Inpatient Hospital Stay

## 2022-12-22 ENCOUNTER — Ambulatory Visit: Admitting: Hematology

## 2022-12-22 ENCOUNTER — Inpatient Hospital Stay (HOSPITAL_BASED_OUTPATIENT_CLINIC_OR_DEPARTMENT_OTHER): Admitting: Hematology

## 2022-12-22 VITALS — BP 159/92 | HR 71 | Temp 98.0°F | Resp 18

## 2022-12-22 VITALS — BP 153/78 | HR 61 | Temp 98.4°F | Resp 17 | Ht 72.0 in | Wt 295.0 lb

## 2022-12-22 DIAGNOSIS — L03115 Cellulitis of right lower limb: Secondary | ICD-10-CM

## 2022-12-22 DIAGNOSIS — D693 Immune thrombocytopenic purpura: Secondary | ICD-10-CM | POA: Diagnosis not present

## 2022-12-22 DIAGNOSIS — Z5112 Encounter for antineoplastic immunotherapy: Secondary | ICD-10-CM | POA: Diagnosis not present

## 2022-12-22 DIAGNOSIS — Z5111 Encounter for antineoplastic chemotherapy: Secondary | ICD-10-CM | POA: Diagnosis not present

## 2022-12-22 LAB — CBC WITH DIFFERENTIAL (CANCER CENTER ONLY)
Abs Immature Granulocytes: 0.03 10*3/uL (ref 0.00–0.07)
Basophils Absolute: 0.1 10*3/uL (ref 0.0–0.1)
Basophils Relative: 1 %
Eosinophils Absolute: 0.3 10*3/uL (ref 0.0–0.5)
Eosinophils Relative: 3 %
HCT: 43.2 % (ref 39.0–52.0)
Hemoglobin: 14.4 g/dL (ref 13.0–17.0)
Immature Granulocytes: 0 %
Lymphocytes Relative: 11 %
Lymphs Abs: 1.3 10*3/uL (ref 0.7–4.0)
MCH: 30 pg (ref 26.0–34.0)
MCHC: 33.3 g/dL (ref 30.0–36.0)
MCV: 90 fL (ref 80.0–100.0)
Monocytes Absolute: 0.8 10*3/uL (ref 0.1–1.0)
Monocytes Relative: 7 %
Neutro Abs: 9.2 10*3/uL — ABNORMAL HIGH (ref 1.7–7.7)
Neutrophils Relative %: 78 %
Platelet Count: 42 10*3/uL — ABNORMAL LOW (ref 150–400)
RBC: 4.8 MIL/uL (ref 4.22–5.81)
RDW: 13.7 % (ref 11.5–15.5)
WBC Count: 11.6 10*3/uL — ABNORMAL HIGH (ref 4.0–10.5)
nRBC: 0 % (ref 0.0–0.2)

## 2022-12-22 LAB — CMP (CANCER CENTER ONLY)
ALT: 19 U/L (ref 0–44)
AST: 27 U/L (ref 15–41)
Albumin: 3.9 g/dL (ref 3.5–5.0)
Alkaline Phosphatase: 72 U/L (ref 38–126)
Anion gap: 7 (ref 5–15)
BUN: 18 mg/dL (ref 8–23)
CO2: 25 mmol/L (ref 22–32)
Calcium: 8.6 mg/dL — ABNORMAL LOW (ref 8.9–10.3)
Chloride: 109 mmol/L (ref 98–111)
Creatinine: 0.89 mg/dL (ref 0.61–1.24)
GFR, Estimated: >60 mL/min (ref >60)
Glucose, Bld: 103 mg/dL — ABNORMAL HIGH (ref 70–99)
Potassium: 4 mmol/L (ref 3.5–5.1)
Sodium: 141 mmol/L (ref 135–145)
Total Bilirubin: 0.7 mg/dL (ref 0.3–1.2)
Total Protein: 6.1 g/dL — ABNORMAL LOW (ref 6.5–8.1)

## 2022-12-22 LAB — IMMATURE PLATELET FRACTION: Immature Platelet Fraction: 10.7 % — ABNORMAL HIGH (ref 1.2–8.6)

## 2022-12-22 LAB — HEPATITIS C ANTIBODY: HCV Ab: NONREACTIVE

## 2022-12-22 MED ORDER — MONTELUKAST SODIUM 10 MG PO TABS
10.0000 mg | ORAL_TABLET | Freq: Once | ORAL | Status: AC
  Administered 2022-12-22: 10 mg via ORAL
  Filled 2022-12-22: qty 1

## 2022-12-22 MED ORDER — SODIUM CHLORIDE 0.9% FLUSH: 10.0000 mL | INTRAVENOUS | Status: DC | PRN

## 2022-12-22 MED ORDER — SODIUM CHLORIDE 0.9 % IV SOLN
375.0000 mg/m2 | Freq: Once | INTRAVENOUS | Status: AC
  Administered 2022-12-22: 1000 mg via INTRAVENOUS
  Filled 2022-12-22: qty 100

## 2022-12-22 MED ORDER — FAMOTIDINE IN NACL 20-0.9 MG/50ML-% IV SOLN
20.0000 mg | Freq: Once | INTRAVENOUS | Status: AC
  Administered 2022-12-22: 20 mg via INTRAVENOUS
  Filled 2022-12-22: qty 50

## 2022-12-22 MED ORDER — METHYLPREDNISOLONE SODIUM SUCC 125 MG IJ SOLR
125.0000 mg | Freq: Once | INTRAMUSCULAR | Status: AC
  Administered 2022-12-22: 125 mg via INTRAVENOUS
  Filled 2022-12-22: qty 2

## 2022-12-22 MED ORDER — HEPARIN SOD (PORK) LOCK FLUSH 100 UNIT/ML IV SOLN: 500.0000 [IU] | Freq: Once | INTRAVENOUS | Status: DC | PRN

## 2022-12-22 MED ORDER — DOXYCYCLINE HYCLATE 100 MG PO TABS: 100.0000 mg | ORAL_TABLET | Freq: Two times a day (BID) | ORAL | 0 refills | Status: DC

## 2022-12-22 MED ORDER — SODIUM CHLORIDE 0.9 % IV SOLN: Freq: Once | INTRAVENOUS | Status: AC

## 2022-12-22 MED ORDER — CETIRIZINE HCL 10 MG/ML IV SOLN
10.0000 mg | Freq: Once | INTRAVENOUS | Status: AC
  Administered 2022-12-22: 10 mg via INTRAVENOUS
  Filled 2022-12-22: qty 1

## 2022-12-22 MED ORDER — ACETAMINOPHEN 500 MG PO TABS
1000.0000 mg | ORAL_TABLET | Freq: Once | ORAL | Status: AC
  Administered 2022-12-22: 1000 mg via ORAL
  Filled 2022-12-22: qty 2

## 2022-12-22 NOTE — Patient Instructions (Signed)
 [facility]  Discharge Instructions: Thank you for choosing [facility] to provide your oncology and hematology care.   If you have a lab appointment with the Cancer Center, please go directly to the Cancer Center and check in at the registration area.   Wear comfortable clothing and clothing appropriate for easy access to any Portacath or PICC line.   We strive to give you quality time with your provider. You may need to reschedule your appointment if you arrive late (15 or more minutes).  Arriving late affects you and other patients whose appointments are after yours.  Also, if you miss three or more appointments without notifying the office, you may be dismissed from the clinic at the provider's discretion.      For prescription refill requests, have your pharmacy contact our office and allow 72 hours for refills to be completed.    Today you received the following chemotherapy and/or immunotherapy agents: Rituximab To help prevent nausea and vomiting after your treatment, we encourage you to take your nausea medication as directed.  BELOW ARE SYMPTOMS THAT SHOULD BE REPORTED IMMEDIATELY: *FEVER GREATER THAN 100.4 F (38 C) OR HIGHER *CHILLS OR SWEATING *NAUSEA AND VOMITING THAT IS NOT CONTROLLED WITH YOUR NAUSEA MEDICATION *UNUSUAL SHORTNESS OF BREATH *UNUSUAL BRUISING OR BLEEDING *URINARY PROBLEMS (pain or burning when urinating, or frequent urination) *BOWEL PROBLEMS (unusual diarrhea, constipation, pain near the anus) TENDERNESS IN MOUTH AND THROAT WITH OR WITHOUT PRESENCE OF ULCERS (sore throat, sores in mouth, or a toothache) UNUSUAL RASH, SWELLING OR PAIN  UNUSUAL VAGINAL DISCHARGE OR ITCHING   Items with * indicate a potential emergency and should be followed up as soon as possible or go to the Emergency Department if any problems should occur.  Please show the CHEMOTHERAPY ALERT CARD or IMMUNOTHERAPY ALERT CARD at check-in to  the Emergency Department and triage nurse.  Should you have questions after your visit or need to cancel or reschedule your appointment, please contact [place] [facility]  Dept: [phone]  and follow the prompts.  Office hours are 8:00 a.m. to 4:30 p.m. Monday - Friday. Please note that voicemails left after 4:00 p.m. may not be returned until the following business day.  We are closed weekends and major holidays. You have access to a nurse at all times for urgent questions. Please call the main number to the clinic Dept: [phone] and follow the prompts.   For any non-urgent questions, you may also contact your provider using MyChart. We now offer e-Visits for anyone 42 and older to request care online for non-urgent symptoms. For details visit [email or web address].   Also download the MyChart app! Go to the app store, search "MyChart", open the app, select [facility], and log in with your MyChart username and password.  Rituximab Injection What is this medication? RITUXIMAB (ri TUX i mab) treats leukemia and lymphoma. It works by blocking a protein that causes cancer cells to grow and multiply. This helps to slow or stop the spread of cancer cells. It may also be used to treat autoimmune conditions, such as arthritis. It works by slowing down an overactive immune system. It is a monoclonal antibody. This medicine may be used for other purposes; ask your health care provider or pharmacist if you have questions. COMMON BRAND NAME(S): RIABNI, Rituxan, RUXIENCE, truxima What should I tell my care team before I take this medication? They need to know if you have  any of these conditions: Chest pain Heart disease Immune system problems Infection, such as chickenpox, cold sores, hepatitis B, herpes Irregular heartbeat or rhythm Kidney disease Low blood counts, such as low white cells, platelets, red cells Lung disease Recent or upcoming vaccine An unusual or  allergic reaction to rituximab, other medications, foods, dyes, or preservatives Pregnant or trying to get pregnant Breast-feeding How should I use this medication? This medication is injected into a vein. It is given by a care team in a hospital or clinic setting. A special MedGuide will be given to you before each treatment. Be sure to read this information carefully each time. Talk to your care team about the use of this medication in children. While this medication may be prescribed for children as young as 6 months for selected conditions, precautions do apply. Overdosage: If you think you have taken too much of this medicine contact a poison control center or emergency room at once. NOTE: This medicine is only for you. Do not share this medicine with others. What if I miss a dose? Keep appointments for follow-up doses. It is important not to miss your dose. Call your care team if you are unable to keep an appointment. What may interact with this medication? Do not take this medication with any of the following: Live vaccines This medication may also interact with the following: Cisplatin This list may not describe all possible interactions. Give your health care provider a list of all the medicines, herbs, non-prescription drugs, or dietary supplements you use. Also tell them if you smoke, drink alcohol, or use illegal drugs. Some items may interact with your medicine. What should I watch for while using this medication? Your condition will be monitored carefully while you are receiving this medication. You may need blood work while taking this medication. This medication can cause serious infusion reactions. To reduce the risk your care team may give you other medications to take before receiving this one. Be sure to follow the directions from your care team. This medication may increase your risk of getting an infection. Call your care team for advice if you get a fever, chills, sore  throat, or other symptoms of a cold or flu. Do not treat yourself. Try to avoid being around people who are sick. Call your care team if you are around anyone with measles, chickenpox, or if you develop sores or blisters that do not heal properly. Avoid taking medications that contain aspirin, acetaminophen, ibuprofen, naproxen, or ketoprofen unless instructed by your care team. These medications may hide a fever. This medication may cause serious skin reactions. They can happen weeks to months after starting the medication. Contact your care team right away if you notice fevers or flu-like symptoms with a rash. The rash may be red or purple and then turn into blisters or peeling of the skin. You may also notice a red rash with swelling of the face, lips, or lymph nodes in your neck or under your arms. In some patients, this medication may cause a serious brain infection that may cause death. If you have any problems seeing, thinking, speaking, walking, or standing, tell your care team right away. If you cannot reach your care team, urgently seek another source of medical care. Talk to your care team if you may be pregnant. Serious birth defects can occur if you take this medication during pregnancy and for 12 months after the last dose. You will need a negative pregnancy test before starting this medication.  Contraception is recommended while taking this medication and for 12 months after the last dose. Your care team can help you find the option that works for you. Do not breastfeed while taking this medication and for at least 6 months after the last dose. What side effects may I notice from receiving this medication? Side effects that you should report to your care team as soon as possible: Allergic reactions or angioedema--skin rash, itching or hives, swelling of the face, eyes, lips, tongue, arms, or legs, trouble swallowing or breathing Bowel blockage--stomach cramping, unable to have a bowel  movement or pass gas, loss of appetite, vomiting Dizziness, loss of balance or coordination, confusion or trouble speaking Heart attack--pain or tightness in the chest, shoulders, arms, or jaw, nausea, shortness of breath, cold or clammy skin, feeling faint or lightheaded Heart rhythm changes--fast or irregular heartbeat, dizziness, feeling faint or lightheaded, chest pain, trouble breathing Infection--fever, chills, cough, sore throat, wounds that don't heal, pain or trouble when passing urine, general feeling of discomfort or being unwell Infusion reactions--chest pain, shortness of breath or trouble breathing, feeling faint or lightheaded Kidney injury--decrease in the amount of urine, swelling of the ankles, hands, or feet Liver injury--right upper belly pain, loss of appetite, nausea, light-colored stool, dark yellow or brown urine, yellowing skin or eyes, unusual weakness or fatigue Redness, blistering, peeling, or loosening of the skin, including inside the mouth Stomach pain that is severe, does not go away, or gets worse Tumor lysis syndrome (TLS)--nausea, vomiting, diarrhea, decrease in the amount of urine, dark urine, unusual weakness or fatigue, confusion, muscle pain or cramps, fast or irregular heartbeat, joint pain Side effects that usually do not require medical attention (report to your care team if they continue or are bothersome): Headache Joint pain Nausea Runny or stuffy nose Unusual weakness or fatigue This list may not describe all possible side effects. Call your doctor for medical advice about side effects. You may report side effects to FDA at 1-800-FDA-1088. Where should I keep my medication? This medication is given in a hospital or clinic. It will not be stored at home. NOTE: This sheet is a summary. It may not cover all possible information. If you have questions about this medicine, talk to your doctor, pharmacist, or health care provider.  2024 Elsevier/Gold  Standard (2021-09-08 00:00:00)

## 2022-12-22 NOTE — Progress Notes (Signed)
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 male who has been referred to [place] by Dr Bradley Ferris MD for evaluation and management of thrombocytopenia.   Patient has a history of hypertension, dyslipidemia, coronary disease status post MI with 2 stents placed more than 5 years ago, remote history of gastric ulcer.   Patient presented to the emergency room with right lower extremity pain and warmth redness and increasing discomfort.  He notes that he had had 2 traumas to the same area on his right leg after which he started having leg pain and swelling.  He was started on doxycycline as outpatient and noted that this failed to resolve his leg pain which was worsening and therefore he came to the emergency room.   In the emergency room the patient was noted to have severe right lower extremity cellulitis and was started on IV ceftriaxone and vancomycin. He was also noted on labs to have severe thrombocytopenia with platelets less than 5k  INTERVAL HISTORY:   Jd Mcmakin is a 62 y.o. 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  I had a phone visit with the patient on 12/08/2022 and was doing well overall with no new medical concerns.   Today, he is accompanied by his brother. Patient reports one open wound in his right lower extremity with mild bleeding which he attributes to hitting his leg against an object while at work. He notes that sulfadiazine cream heals it quickly. There is redness around the area and there is mild soreness in the area at this time. He does also note a bump in his left lower extremity. He notes that he has  been accident-prone since childhood. Patient denies any balance issues.   Patient complains of right-sided soreness in his jaw when opening his mouth and is planning on being seen by a dentist.   He denies any fever, bleeding concerns, black stool, blood in the stool, epistaxis, gum bleeds, hematuria, or other bleeding concerns. He denies any other new infection, abdominal pain, or change in bowel habits. Patient denies any other major medication changes and is not taking any other OTC medications at this time. Patient is allergic to ampicillin.  MEDICAL HISTORY:  Past Medical History:  Diagnosis Date   Coronary artery disease    Gastric ulcer    Heart attack (HCC)    Hypercholesteremia    Hypertension     SURGICAL HISTORY: Past Surgical History:  Procedure Laterality Date   CORONARY ANGIOPLASTY WITH STENT PLACEMENT     TONSILLECTOMY      SOCIAL HISTORY: Social History   Socioeconomic History   Marital status: Single    Spouse name: Not on file   Number of children: Not on file   Years of education: Not on file   Highest education level: Not on file  Occupational History   Occupation: Ecologist: Production assistant, radio FOR SELF EMPLOYED    Comment: Retired from [employer]  Tobacco Use   Smoking status: Never   Smokeless tobacco: Never  Vaping Use   Vaping status: Never Used  Substance and Sexual Activity   Alcohol use: No    Comment: "Quit 24  years ago"   Drug use: No   Sexual activity: Not Currently  Other Topics Concern   Not on file  Social History Narrative   Not on file   Social Determinants of Health   Financial Resource Strain: Not on file  Food Insecurity: No Food Insecurity (08/21/2022)   Hunger Vital Sign    Worried About Running Out of Food in the Last Year: Never true    Ran Out of Food in the Last Year: Never true  Transportation Needs: No Transportation Needs (08/21/2022)   PRAPARE - Administrator, Civil Service (Medical): No    Lack  of Transportation (Non-Medical): No  Physical Activity: Not on file  Stress: Not on file  Social Connections: Not on file  Intimate Partner Violence: Not At Risk (08/21/2022)   Humiliation, Afraid, Rape, and Kick questionnaire    Fear of Current or Ex-Partner: No    Emotionally Abused: No    Physically Abused: No    Sexually Abused: No    FAMILY HISTORY: Family History  Problem Relation Age of Onset   Dementia Mother    Cancer Father    Diabetes Father    Emphysema Father    Colon cancer Father    Hypertension Brother     ALLERGIES:  is allergic to ampicillin.  MEDICATIONS:  Current Outpatient Medications  Medication Sig Dispense Refill   acetaminophen (TYLENOL) 500 MG tablet Take 500 mg by mouth 2 (two) times daily as needed for mild pain or headache.     atorvastatin (LIPITOR) 80 MG tablet Take 80 mg by mouth every evening.     carvedilol (COREG) 12.5 MG tablet Take 12.5 mg by mouth 2 (two) times daily.     Emollient (CETAPHIL) cream Apply 1 Application topically daily as needed (for dryness- affected areas).     lisinopril (ZESTRIL) 10 MG tablet Take 10 mg by mouth daily.     Multiple Vitamins-Minerals (CENTRUM SILVER 50+MEN PO) Take 1 tablet by mouth daily with breakfast.     ondansetron (ZOFRAN) 8 MG tablet Take 1 tablet (8 mg total) by mouth every 8 (eight) hours as needed for nausea or vomiting. 20 tablet 0   silver sulfADIAZINE (SILVADENE) 1 % cream Apply topically daily. 50 g 0   No current facility-administered medications for this visit.    REVIEW OF SYSTEMS:    10 Point review of Systems was done is negative except as noted above.    PHYSICAL EXAMINATION:  .BP (!) 153/78 (BP Location: Right Arm, Patient Position: Sitting)   Pulse 61   Temp 98.4 F (36.9 C) (Oral)   Resp 17   Ht 6' (1.829 m)   Wt 295 lb (133.8 kg)   SpO2 99%   BMI 40.01 kg/m   GENERAL:alert, in no acute distress and comfortable SKIN: no acute rashes, no significant lesions EYES:  conjunctiva are pink and non-injected, sclera anicteric OROPHARYNX: MMM, no exudates, no oropharyngeal erythema or ulceration NECK: supple, no JVD LYMPH:  no palpable lymphadenopathy in the cervical, axillary or inguinal regions LUNGS: clear to auscultation b/l with normal respiratory effort HEART: regular rate & rhythm ABDOMEN:  normoactive bowel sounds , non tender, not distended. Extremity: no pedal edema PSYCH: alert & oriented x 3 with fluent speech NEURO: no focal motor/sensory deficits   LABORATORY DATA:  I have reviewed the data as listed .    Latest Ref Rng & Units 12/22/2022    7:35 AM 12/07/2022    7:58 AM  10/19/2022    8:04 AM  CBC  WBC 4.0 - 10.5 K/uL 11.6  6.9  6.7   Hemoglobin 13.0 - 17.0 g/dL 75.6  43.3  29.5   Hematocrit 39.0 - 52.0 % 43.2  45.3  42.5   Platelets 150 - 400 K/uL 42  44  75    .    Latest Ref Rng & Units 12/22/2022    7:35 AM 12/07/2022    7:58 AM 10/19/2022    8:04 AM  CMP  Glucose 70 - 99 mg/dL 188  81  80   BUN 8 - 23 mg/dL 18  17  16    Creatinine 0.61 - 1.24 mg/dL 4.16  6.06  3.01   Sodium 135 - 145 mmol/L 141  147  143   Potassium 3.5 - 5.1 mmol/L 4.0  5.3  4.2   Chloride 98 - 111 mmol/L 109  108  109   CO2 22 - 32 mmol/L 25  32  29   Calcium 8.9 - 10.3 mg/dL 8.6  9.5  8.9   Total Protein 6.5 - 8.1 g/dL 6.1  6.9  6.0   Total Bilirubin 0.3 - 1.2 mg/dL 0.7  0.6  0.5   Alkaline Phos 38 - 126 U/L 72  79  69   AST 15 - 41 U/L 27  30  28    ALT 0 - 44 U/L 19  20  19     Immature platelet fraction 6.9$   RADIOGRAPHIC STUDIES: I have personally reviewed the radiological images as listed and agreed with the findings in the report. No results found.  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 Immature platelet fraction of 37.2% suggesting  excellent bone marrow response. Use of high doses of ibuprofen over the last 5 days could also be a concern. Normal TSH B12 and folate levels Monospot test negative All of the patients questions were answered with apparent satisfaction. The patient knows to call the clinic with any problems, questions or concerns. CT abdomen pelvis done today shows no hepatosplenomegaly or signs of chronic liver disease.   #2 right lower extremity cellulitis following injury to the right leg with skin breach.-On ceftriaxone and vancomycin per hospitalist. #3 hypertension #4 coronary disease status post remote history of PCI x 2 #5 obesity  PLAN:  -Discussed lab results on 12/22/2022 in detail with patient. CBC showed WBC of 11.6K, hemoglobin of 14.4, and platelets low at 42K. -platelets gradually decreasing at this time -hemoglobin and WBC stable -educated patient that if platelets are low for at least 3 months, it would be considered chronic -it appears that patient's antibodies are persistent and not temporarily related to infections -proceed with C1 Rituxan today -will start doxycyclin for a course of 7-10 days to make sure wound does not flare especially since patient will be on Rituxan  -would recommend shin guards reduce risk of injury in lower extremities and protection against frequent wounds -jaw pain may be related to salivary gland stones. Informed patient that sour candy may move saliva. Antibiotics may also help improve symptoms.  -okay to take Tylenol if needed, advised patient to avoid OTC NSAID medications -recommend a well-balanced diet to optimize nutrition -continue vitamin B complex, one tablet daily to ensure no that there are no subtle vitamin B deficiencies -answered all  of patient's and his brother's questions regarding Rituxan and abnormal antibodies in detail -answered all of patient's questions regarding a splenectomy which is not needed at this time  FOLLOW-UP: -MD visit in 1  week with RItuxan C2 (will see in infusion for toxicity check)  The total time spent in the appointment was 30 minutes* .  All of the patient's questions were answered with apparent satisfaction. The patient knows to call the clinic with any problems, questions or concerns.   Wyvonnia Lora MD MS AAHIVMS [facility] Hematology/Oncology Physician [facility]  .*Total Encounter Time as defined by the Centers for Medicare and Medicaid Services includes, in addition to the face-to-face time of a patient visit (documented in the note above) non-face-to-face time: obtaining and reviewing outside history, ordering and reviewing medications, tests or procedures, care coordination (communications with other health care professionals or caregivers) and documentation in the medical record.    I,Mitra Faeizi,acting as a Neurosurgeon for Wyvonnia Lora, MD.,have documented all relevant documentation on the behalf of Wyvonnia Lora, MD,as directed by  Wyvonnia Lora, MD while in the presence of Wyvonnia Lora, MD.  .I have reviewed the above documentation for accuracy and completeness, and I agree with the above. [name] MD

## 2022-12-25 NOTE — Progress Notes (Signed)
Contacted pt post first treatment (12/22/22). Pt stated he is feeling well and has had no issues. Pt states he will call for any questions

## 2022-12-27 ENCOUNTER — Other Ambulatory Visit: Payer: Self-pay

## 2022-12-27 DIAGNOSIS — D693 Immune thrombocytopenic purpura: Secondary | ICD-10-CM

## 2022-12-28 ENCOUNTER — Inpatient Hospital Stay

## 2022-12-28 ENCOUNTER — Encounter: Payer: Self-pay | Admitting: Hematology

## 2022-12-28 ENCOUNTER — Encounter: Payer: Self-pay | Admitting: Hematology and Oncology

## 2022-12-28 ENCOUNTER — Inpatient Hospital Stay (HOSPITAL_BASED_OUTPATIENT_CLINIC_OR_DEPARTMENT_OTHER): Admitting: Hematology

## 2022-12-28 VITALS — BP 147/86 | HR 78 | Temp 98.0°F | Resp 17

## 2022-12-28 DIAGNOSIS — Z5111 Encounter for antineoplastic chemotherapy: Secondary | ICD-10-CM | POA: Diagnosis not present

## 2022-12-28 DIAGNOSIS — D693 Immune thrombocytopenic purpura: Secondary | ICD-10-CM

## 2022-12-28 LAB — CMP (CANCER CENTER ONLY)
ALT: 20 U/L (ref 0–44)
AST: 26 U/L (ref 15–41)
Albumin: 4.1 g/dL (ref 3.5–5.0)
Alkaline Phosphatase: 73 U/L (ref 38–126)
Anion gap: 9 (ref 5–15)
BUN: 19 mg/dL (ref 8–23)
CO2: 25 mmol/L (ref 22–32)
Calcium: 9.3 mg/dL (ref 8.9–10.3)
Chloride: 109 mmol/L (ref 98–111)
Creatinine: 0.96 mg/dL (ref 0.61–1.24)
GFR, Estimated: >60 mL/min (ref >60)
Glucose, Bld: 124 mg/dL — ABNORMAL HIGH (ref 70–99)
Potassium: 3.7 mmol/L (ref 3.5–5.1)
Sodium: 143 mmol/L (ref 135–145)
Total Bilirubin: 0.7 mg/dL (ref 0.3–1.2)
Total Protein: 6.6 g/dL (ref 6.5–8.1)

## 2022-12-28 LAB — CBC WITH DIFFERENTIAL (CANCER CENTER ONLY)
Abs Immature Granulocytes: 0.02 10*3/uL (ref 0.00–0.07)
Basophils Absolute: 0.1 10*3/uL (ref 0.0–0.1)
Basophils Relative: 1 %
Eosinophils Absolute: 0.4 10*3/uL (ref 0.0–0.5)
Eosinophils Relative: 5 %
HCT: 44.8 % (ref 39.0–52.0)
Hemoglobin: 15.1 g/dL (ref 13.0–17.0)
Immature Granulocytes: 0 %
Lymphocytes Relative: 20 %
Lymphs Abs: 1.5 10*3/uL (ref 0.7–4.0)
MCH: 30.1 pg (ref 26.0–34.0)
MCHC: 33.7 g/dL (ref 30.0–36.0)
MCV: 89.2 fL (ref 80.0–100.0)
Monocytes Absolute: 0.6 10*3/uL (ref 0.1–1.0)
Monocytes Relative: 8 %
Neutro Abs: 4.8 10*3/uL (ref 1.7–7.7)
Neutrophils Relative %: 66 %
Platelet Count: 108 10*3/uL — ABNORMAL LOW (ref 150–400)
RBC: 5.02 MIL/uL (ref 4.22–5.81)
RDW: 13.4 % (ref 11.5–15.5)
WBC Count: 7.4 10*3/uL (ref 4.0–10.5)
nRBC: 0 % (ref 0.0–0.2)

## 2022-12-28 LAB — IMMATURE PLATELET FRACTION: Immature Platelet Fraction: 7.9 % (ref 1.2–8.6)

## 2022-12-28 MED ORDER — SODIUM CHLORIDE 0.9 % IV SOLN: Freq: Once | INTRAVENOUS | Status: AC

## 2022-12-28 MED ORDER — METHYLPREDNISOLONE SODIUM SUCC 125 MG IJ SOLR
125.0000 mg | Freq: Once | INTRAMUSCULAR | Status: AC
  Administered 2022-12-28: 125 mg via INTRAVENOUS
  Filled 2022-12-28: qty 2

## 2022-12-28 MED ORDER — SODIUM CHLORIDE 0.9 % IV SOLN
375.0000 mg/m2 | Freq: Once | INTRAVENOUS | Status: AC
  Administered 2022-12-28: 1000 mg via INTRAVENOUS
  Filled 2022-12-28: qty 100

## 2022-12-28 MED ORDER — FAMOTIDINE IN NACL 20-0.9 MG/50ML-% IV SOLN
20.0000 mg | Freq: Once | INTRAVENOUS | Status: AC
  Administered 2022-12-28: 20 mg via INTRAVENOUS
  Filled 2022-12-28: qty 50

## 2022-12-28 MED ORDER — CETIRIZINE HCL 10 MG/ML IV SOLN
10.0000 mg | Freq: Once | INTRAVENOUS | Status: AC
  Administered 2022-12-28: 10 mg via INTRAVENOUS
  Filled 2022-12-28: qty 1

## 2022-12-28 MED ORDER — ACETAMINOPHEN 500 MG PO TABS
1000.0000 mg | ORAL_TABLET | Freq: Once | ORAL | Status: AC
  Administered 2022-12-28: 1000 mg via ORAL
  Filled 2022-12-28: qty 2

## 2022-12-28 MED ORDER — MONTELUKAST SODIUM 10 MG PO TABS
10.0000 mg | ORAL_TABLET | Freq: Once | ORAL | Status: AC
  Administered 2022-12-28: 10 mg via ORAL
  Filled 2022-12-28: qty 1

## 2022-12-28 NOTE — Patient Instructions (Signed)
[facility]  Discharge Instructions: Thank you for choosing [facility] to provide your oncology and hematology care.   If you have a lab appointment with the [facility], please go directly to the [facility] and check in at the registration area.   Wear comfortable clothing and clothing appropriate for easy access to any Portacath or PICC line.   We strive to give you quality time with your provider. You may need to reschedule your appointment if you arrive late (15 or more minutes).  Arriving late affects you and other patients whose appointments are after yours.  Also, if you miss three or more appointments without notifying the office, you may be dismissed from the clinic at the provider's discretion.      For prescription refill requests, have your pharmacy contact our office and allow 72 hours for refills to be completed.    Today you received the following chemotherapy and/or immunotherapy agents: Rituximab.       To help prevent nausea and vomiting after your treatment, we encourage you to take your nausea medication as directed.  BELOW ARE SYMPTOMS THAT SHOULD BE REPORTED IMMEDIATELY: *FEVER GREATER THAN 100.4 F (38 C) OR HIGHER *CHILLS OR SWEATING *NAUSEA AND VOMITING THAT IS NOT CONTROLLED WITH YOUR NAUSEA MEDICATION *UNUSUAL SHORTNESS OF BREATH *UNUSUAL BRUISING OR BLEEDING *URINARY PROBLEMS (pain or burning when urinating, or frequent urination) *BOWEL PROBLEMS (unusual diarrhea, constipation, pain near the anus) TENDERNESS IN MOUTH AND THROAT WITH OR WITHOUT PRESENCE OF ULCERS (sore throat, sores in mouth, or a toothache) UNUSUAL RASH, SWELLING OR PAIN  UNUSUAL VAGINAL DISCHARGE OR ITCHING   Items with * indicate a potential emergency and should be followed up as soon as possible or go to the Emergency Department if any problems should occur.  Please show the CHEMOTHERAPY ALERT CARD or IMMUNOTHERAPY ALERT CARD at  check-in to the Emergency Department and triage nurse.  Should you have questions after your visit or need to cancel or reschedule your appointment, please contact [facility]  Dept: [phone]  and follow the prompts.  Office hours are 8:00 a.m. to 4:30 p.m. Monday - Friday. Please note that voicemails left after 4:00 p.m. may not be returned until the following business day.  We are closed weekends and major holidays. You have access to a nurse at all times for urgent questions. Please call the main number to the clinic Dept: [phone] and follow the prompts.   For any non-urgent questions, you may also contact your provider using MyChart. We now offer e-Visits for anyone 20 and older to request care online for non-urgent symptoms. For details visit [email or web address].   Also download the MyChart app! Go to the app store, search "MyChart", open the app, select [facility], and log in with your MyChart username and password.

## 2022-12-28 NOTE — Progress Notes (Signed)
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 male who has been referred to [place] by Dr Bradley Ferris MD for evaluation and management of thrombocytopenia.   Patient has a history of hypertension, dyslipidemia, coronary disease status post MI with 2 stents placed more than 5 years ago, remote history of gastric ulcer.   Patient presented to the emergency room with right lower extremity pain and warmth redness and increasing discomfort.  He notes that he had had 2 traumas to the same area on his right leg after which he started having leg pain and swelling.  He was started on doxycycline as outpatient and noted that this failed to resolve his leg pain which was worsening and therefore he came to the emergency room.   In the emergency room the patient was noted to have severe right lower extremity cellulitis and was started on IV ceftriaxone and vancomycin. He was also noted on labs to have severe thrombocytopenia with platelets less than 5k  INTERVAL HISTORY:   Joel Hoffman is a 62 y.o. 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   Patient was last seen by me on 12/22/2022 and reported having an open wound in his right lower extremity with mild bleeding, redness, and mild soreness after hitting his leg on an object. He also noted a bump in his left lower extremity and right-sided jaw soreness.  Today, he is here for toxicity check for cycle 2 day 1 of treatment.  Patient reports that he suffered a fall four days ago causing two cuts on his right lower extremity with plenty of bleeding and a bruise on the second toe of his left foot. He  notes that he wears a compression wrap daily. Silver sulfadiazine does improve symptoms.   He reports that he has tolerated Rituxan well with no major toxicities. Patient denies any rashes, nausea, chills, or fever.    MEDICAL HISTORY:  Past Medical History:  Diagnosis Date   Coronary artery disease    Gastric ulcer    Heart attack (HCC)    Hypercholesteremia    Hypertension     SURGICAL HISTORY: Past Surgical History:  Procedure Laterality Date   CORONARY ANGIOPLASTY WITH STENT PLACEMENT     TONSILLECTOMY      SOCIAL HISTORY: Social History   Socioeconomic History   Marital status: Single    Spouse name: Not on file   Number of children: Not on file   Years of education: Not on file   Highest education level: Not on file  Occupational History   Occupation: Ecologist: Production assistant, radio FOR SELF EMPLOYED    Comment: Retired from [employer]  Tobacco Use   Smoking status: Never   Smokeless tobacco: Never  Vaping Use   Vaping status: Never Used  Substance and Sexual Activity   Alcohol use: No    Comment: "Quit 24 years ago"   Drug use: No   Sexual activity: Not Currently  Other Topics Concern   Not on file  Social History Narrative   Not on file   Social Determinants of Health   Financial Resource Strain: Not on file  Food Insecurity: No Food Insecurity (  08/21/2022)   Hunger Vital Sign    Worried About Running Out of Food in the Last Year: Never true    Ran Out of Food in the Last Year: Never true  Transportation Needs: No Transportation Needs (08/21/2022)   PRAPARE - Administrator, Civil Service (Medical): No    Lack of Transportation (Non-Medical): No  Physical Activity: Not on file  Stress: Not on file  Social Connections: Not on file  Intimate Partner Violence: Not At Risk (08/21/2022)   Humiliation, Afraid, Rape, and Kick questionnaire    Fear of Current or Ex-Partner: No    Emotionally Abused: No    Physically Abused: No    Sexually  Abused: No    FAMILY HISTORY: Family History  Problem Relation Age of Onset   Dementia Mother    Cancer Father    Diabetes Father    Emphysema Father    Colon cancer Father    Hypertension Brother     ALLERGIES:  is allergic to ampicillin.  MEDICATIONS:  Current Outpatient Medications  Medication Sig Dispense Refill   acetaminophen (TYLENOL) 500 MG tablet Take 500 mg by mouth 2 (two) times daily as needed for mild pain or headache.     atorvastatin (LIPITOR) 80 MG tablet Take 80 mg by mouth every evening.     carvedilol (COREG) 12.5 MG tablet Take 12.5 mg by mouth 2 (two) times daily.     doxycycline (VIBRA-TABS) 100 MG tablet Take 1 tablet (100 mg total) by mouth 2 (two) times daily. 20 tablet 0   Emollient (CETAPHIL) cream Apply 1 Application topically daily as needed (for dryness- affected areas).     lisinopril (ZESTRIL) 10 MG tablet Take 10 mg by mouth daily.     Multiple Vitamins-Minerals (CENTRUM SILVER 50+MEN PO) Take 1 tablet by mouth daily with breakfast.     ondansetron (ZOFRAN) 8 MG tablet Take 1 tablet (8 mg total) by mouth every 8 (eight) hours as needed for nausea or vomiting. 20 tablet 0   silver sulfADIAZINE (SILVADENE) 1 % cream Apply topically daily. 50 g 0   No current facility-administered medications for this visit.    REVIEW OF SYSTEMS:    10 Point review of Systems was done is negative except as noted above.   PHYSICAL EXAMINATION:   GENERAL:alert, in no acute distress and comfortable SKIN: no acute rashes, no significant lesions EYES: conjunctiva are pink and non-injected, sclera anicteric OROPHARYNX: MMM, no exudates, no oropharyngeal erythema or ulceration NECK: supple, no JVD LYMPH:  no palpable lymphadenopathy in the cervical, axillary or inguinal regions LUNGS: clear to auscultation b/l with normal respiratory effort HEART: regular rate & rhythm ABDOMEN:  normoactive bowel sounds , non tender, not distended. Extremity: no pedal  edema PSYCH: alert & oriented x 3 with fluent speech NEURO: no focal motor/sensory deficits   LABORATORY DATA:  I have reviewed the data as listed .    Latest Ref Rng & Units 12/28/2022    7:07 AM 12/22/2022    7:35 AM 12/07/2022    7:58 AM  CBC  WBC 4.0 - 10.5 K/uL 7.4  11.6  6.9   Hemoglobin 13.0 - 17.0 g/dL 16.1  09.6  04.5   Hematocrit 39.0 - 52.0 % 44.8  43.2  45.3   Platelets 150 - 400 K/uL 108  42  44    .    Latest Ref Rng & Units 12/22/2022    7:35 AM 12/07/2022    7:58 AM  10/19/2022    8:04 AM  CMP  Glucose 70 - 99 mg/dL 782  81  80   BUN 8 - 23 mg/dL 18  17  16    Creatinine 0.61 - 1.24 mg/dL 9.56  2.13  0.86   Sodium 135 - 145 mmol/L 141  147  143   Potassium 3.5 - 5.1 mmol/L 4.0  5.3  4.2   Chloride 98 - 111 mmol/L 109  108  109   CO2 22 - 32 mmol/L 25  32  29   Calcium 8.9 - 10.3 mg/dL 8.6  9.5  8.9   Total Protein 6.5 - 8.1 g/dL 6.1  6.9  6.0   Total Bilirubin 0.3 - 1.2 mg/dL 0.7  0.6  0.5   Alkaline Phos 38 - 126 U/L 72  79  69   AST 15 - 41 U/L 27  30  28    ALT 0 - 44 U/L 19  20  19     Immature platelet fraction 6.9$   RADIOGRAPHIC STUDIES: I have personally reviewed the radiological images as listed and agreed with the findings in the report. No results found.  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 Immature platelet fraction of 37.2% suggesting excellent bone marrow response. Use of high doses of ibuprofen over the last 5 days could also be a concern. Normal TSH B12 and folate levels Monospot test negative All of the patients questions were answered with apparent satisfaction. The patient knows to call the clinic with any problems, questions or concerns. CT abdomen pelvis done today shows no hepatosplenomegaly or signs of chronic liver disease.    #2 right lower extremity cellulitis following injury to the right leg with skin breach.-On ceftriaxone and vancomycin per hospitalist.  #3 hypertension #4 coronary disease status post remote history of PCI x 2 #5 obesity  PLAN:  -Discussed lab results on 12/28/22 in detail with patient. CBC showed WBC of 7.4K, hemoglobin of 15.1, and platelets improved to 108K. -platelets have nearly doubled. Platelets were previously 42K on 12/22/2022 -patient has tolerated Rituxan well with no major toxicities -Proceed with cycle 2 of Rituxan treatment -discussed goal of long-term remission with treatment.  -answered all of patient's questions in detail -advised patient to monitor wounds for any infections -may consider options of shin guards or knee-high protective socks for protections against wounds -wound dressing done with sterile gauze and coban dressing. -recommend a well-balanced diet to optimize nutrition -continue vitamin B complex, one tablet daily to ensure no that there are no subtle vitamin B deficiencies  FOLLOW-UP: Follow-up for cycle 3 and cycle 4 of weekly Rituxan as per scheduled appointments.  The total time spent in the appointment was 30 minutes* .  All of the patient's questions were answered with apparent satisfaction. The patient knows to call the clinic with any problems, questions or concerns.   Wyvonnia Lora MD MS AAHIVMS [facility] Hematology/Oncology Physician [facility]  .*Total Encounter Time as defined by the Centers for Medicare and Medicaid Services includes, in addition to the face-to-face time of a patient visit (documented in the note above) non-face-to-face time: obtaining and reviewing outside history, ordering and reviewing medications, tests or procedures, care coordination (communications with other health care professionals or caregivers) and documentation in the medical record.  I,Mitra Faeizi,acting as a Neurosurgeon for Wyvonnia Lora, MD.,have  documented all relevant documentation on the behalf of Wyvonnia Lora, MD,as directed by  Wyvonnia Lora, MD while in the presence of Wyvonnia Lora, MD.   .I have reviewed the above documentation for accuracy and completeness, and I agree with the above. [name] MD

## 2023-01-02 ENCOUNTER — Other Ambulatory Visit: Payer: Self-pay

## 2023-01-02 DIAGNOSIS — D693 Immune thrombocytopenic purpura: Secondary | ICD-10-CM

## 2023-01-03 ENCOUNTER — Ambulatory Visit: Admitting: Hematology

## 2023-01-03 ENCOUNTER — Inpatient Hospital Stay: Attending: Hematology

## 2023-01-03 ENCOUNTER — Encounter: Payer: Self-pay | Admitting: Hematology and Oncology

## 2023-01-03 ENCOUNTER — Inpatient Hospital Stay

## 2023-01-03 ENCOUNTER — Encounter: Payer: Self-pay | Admitting: Hematology

## 2023-01-03 VITALS — BP 162/87 | HR 68 | Temp 98.2°F | Resp 17

## 2023-01-03 DIAGNOSIS — Z79899 Other long term (current) drug therapy: Secondary | ICD-10-CM | POA: Diagnosis not present

## 2023-01-03 DIAGNOSIS — I10 Essential (primary) hypertension: Secondary | ICD-10-CM | POA: Diagnosis not present

## 2023-01-03 DIAGNOSIS — Z5111 Encounter for antineoplastic chemotherapy: Secondary | ICD-10-CM | POA: Diagnosis present

## 2023-01-03 DIAGNOSIS — E669 Obesity, unspecified: Secondary | ICD-10-CM | POA: Insufficient documentation

## 2023-01-03 DIAGNOSIS — D693 Immune thrombocytopenic purpura: Secondary | ICD-10-CM

## 2023-01-03 DIAGNOSIS — L03115 Cellulitis of right lower limb: Secondary | ICD-10-CM | POA: Insufficient documentation

## 2023-01-03 DIAGNOSIS — Z6839 Body mass index (BMI) 39.0-39.9, adult: Secondary | ICD-10-CM | POA: Insufficient documentation

## 2023-01-03 LAB — CBC WITH DIFFERENTIAL (CANCER CENTER ONLY)
Abs Immature Granulocytes: 0.04 10*3/uL (ref 0.00–0.07)
Basophils Absolute: 0.1 10*3/uL (ref 0.0–0.1)
Basophils Relative: 1 %
Eosinophils Absolute: 0.3 10*3/uL (ref 0.0–0.5)
Eosinophils Relative: 4 %
HCT: 42.7 % (ref 39.0–52.0)
Hemoglobin: 14.7 g/dL (ref 13.0–17.0)
Immature Granulocytes: 1 %
Lymphocytes Relative: 22 %
Lymphs Abs: 1.8 10*3/uL (ref 0.7–4.0)
MCH: 30.4 pg (ref 26.0–34.0)
MCHC: 34.4 g/dL (ref 30.0–36.0)
MCV: 88.4 fL (ref 80.0–100.0)
Monocytes Absolute: 0.6 10*3/uL (ref 0.1–1.0)
Monocytes Relative: 8 %
Neutro Abs: 5.4 10*3/uL (ref 1.7–7.7)
Neutrophils Relative %: 64 %
Platelet Count: 88 10*3/uL — ABNORMAL LOW (ref 150–400)
RBC: 4.83 MIL/uL (ref 4.22–5.81)
RDW: 13.6 % (ref 11.5–15.5)
WBC Count: 8.2 10*3/uL (ref 4.0–10.5)
nRBC: 0 % (ref 0.0–0.2)

## 2023-01-03 LAB — CMP (CANCER CENTER ONLY)
ALT: 16 U/L (ref 0–44)
AST: 23 U/L (ref 15–41)
Albumin: 4 g/dL (ref 3.5–5.0)
Alkaline Phosphatase: 76 U/L (ref 38–126)
Anion gap: 5 (ref 5–15)
BUN: 20 mg/dL (ref 8–23)
CO2: 27 mmol/L (ref 22–32)
Calcium: 9.2 mg/dL (ref 8.9–10.3)
Chloride: 109 mmol/L (ref 98–111)
Creatinine: 0.88 mg/dL (ref 0.61–1.24)
GFR, Estimated: >60 mL/min (ref >60)
Glucose, Bld: 92 mg/dL (ref 70–99)
Potassium: 4 mmol/L (ref 3.5–5.1)
Sodium: 141 mmol/L (ref 135–145)
Total Bilirubin: 0.6 mg/dL (ref 0.3–1.2)
Total Protein: 6.3 g/dL — ABNORMAL LOW (ref 6.5–8.1)

## 2023-01-03 LAB — IMMATURE PLATELET FRACTION: Immature Platelet Fraction: 10.9 % — ABNORMAL HIGH (ref 1.2–8.6)

## 2023-01-03 MED ORDER — CETIRIZINE HCL 10 MG/ML IV SOLN
10.0000 mg | Freq: Once | INTRAVENOUS | Status: AC
  Administered 2023-01-03: 10 mg via INTRAVENOUS
  Filled 2023-01-03: qty 1

## 2023-01-03 MED ORDER — MONTELUKAST SODIUM 10 MG PO TABS
10.0000 mg | ORAL_TABLET | Freq: Once | ORAL | Status: AC
  Administered 2023-01-03: 10 mg via ORAL
  Filled 2023-01-03: qty 1

## 2023-01-03 MED ORDER — FAMOTIDINE IN NACL 20-0.9 MG/50ML-% IV SOLN
20.0000 mg | Freq: Once | INTRAVENOUS | Status: AC
  Administered 2023-01-03: 20 mg via INTRAVENOUS
  Filled 2023-01-03: qty 50

## 2023-01-03 MED ORDER — METHYLPREDNISOLONE SODIUM SUCC 125 MG IJ SOLR
125.0000 mg | Freq: Once | INTRAMUSCULAR | Status: AC
  Administered 2023-01-03: 125 mg via INTRAVENOUS
  Filled 2023-01-03: qty 2

## 2023-01-03 MED ORDER — SODIUM CHLORIDE 0.9 % IV SOLN
375.0000 mg/m2 | Freq: Once | INTRAVENOUS | Status: AC
  Administered 2023-01-03: 1000 mg via INTRAVENOUS
  Filled 2023-01-03: qty 100

## 2023-01-03 MED ORDER — ACETAMINOPHEN 500 MG PO TABS
1000.0000 mg | ORAL_TABLET | Freq: Once | ORAL | Status: AC
  Administered 2023-01-03: 1000 mg via ORAL
  Filled 2023-01-03: qty 2

## 2023-01-03 MED ORDER — SODIUM CHLORIDE 0.9 % IV SOLN: Freq: Once | INTRAVENOUS | Status: AC

## 2023-01-03 NOTE — Patient Instructions (Signed)
[facility]  Discharge Instructions: Thank you for choosing [facility] to provide your oncology and hematology care.   If you have a lab appointment with the [facility], please go directly to the [facility] and check in at the registration area.   Wear comfortable clothing and clothing appropriate for easy access to any Portacath or PICC line.   We strive to give you quality time with your provider. You may need to reschedule your appointment if you arrive late (15 or more minutes).  Arriving late affects you and other patients whose appointments are after yours.  Also, if you miss three or more appointments without notifying the office, you may be dismissed from the clinic at the provider's discretion.      For prescription refill requests, have your pharmacy contact our office and allow 72 hours for refills to be completed.    Today you received the following chemotherapy and/or immunotherapy agents: Rituximab.       To help prevent nausea and vomiting after your treatment, we encourage you to take your nausea medication as directed.  BELOW ARE SYMPTOMS THAT SHOULD BE REPORTED IMMEDIATELY: *FEVER GREATER THAN 100.4 F (38 C) OR HIGHER *CHILLS OR SWEATING *NAUSEA AND VOMITING THAT IS NOT CONTROLLED WITH YOUR NAUSEA MEDICATION *UNUSUAL SHORTNESS OF BREATH *UNUSUAL BRUISING OR BLEEDING *URINARY PROBLEMS (pain or burning when urinating, or frequent urination) *BOWEL PROBLEMS (unusual diarrhea, constipation, pain near the anus) TENDERNESS IN MOUTH AND THROAT WITH OR WITHOUT PRESENCE OF ULCERS (sore throat, sores in mouth, or a toothache) UNUSUAL RASH, SWELLING OR PAIN  UNUSUAL VAGINAL DISCHARGE OR ITCHING   Items with * indicate a potential emergency and should be followed up as soon as possible or go to the Emergency Department if any problems should occur.  Please show the CHEMOTHERAPY ALERT CARD or IMMUNOTHERAPY ALERT CARD at  check-in to the Emergency Department and triage nurse.  Should you have questions after your visit or need to cancel or reschedule your appointment, please contact [facility]  Dept: [phone]  and follow the prompts.  Office hours are 8:00 a.m. to 4:30 p.m. Monday - Friday. Please note that voicemails left after 4:00 p.m. may not be returned until the following business day.  We are closed weekends and major holidays. You have access to a nurse at all times for urgent questions. Please call the main number to the clinic Dept: [phone] and follow the prompts.   For any non-urgent questions, you may also contact your provider using MyChart. We now offer e-Visits for anyone 20 and older to request care online for non-urgent symptoms. For details visit [email or web address].   Also download the MyChart app! Go to the app store, search "MyChart", open the app, select [facility], and log in with your MyChart username and password.

## 2023-01-11 ENCOUNTER — Other Ambulatory Visit: Payer: Self-pay

## 2023-01-11 DIAGNOSIS — D693 Immune thrombocytopenic purpura: Secondary | ICD-10-CM

## 2023-01-12 ENCOUNTER — Inpatient Hospital Stay

## 2023-01-12 ENCOUNTER — Inpatient Hospital Stay (HOSPITAL_BASED_OUTPATIENT_CLINIC_OR_DEPARTMENT_OTHER): Admitting: Hematology

## 2023-01-12 VITALS — BP 141/89 | HR 64 | Temp 97.6°F | Resp 18

## 2023-01-12 DIAGNOSIS — D693 Immune thrombocytopenic purpura: Secondary | ICD-10-CM

## 2023-01-12 DIAGNOSIS — Z5111 Encounter for antineoplastic chemotherapy: Secondary | ICD-10-CM | POA: Diagnosis not present

## 2023-01-12 LAB — IMMATURE PLATELET FRACTION: Immature Platelet Fraction: 15.3 % — ABNORMAL HIGH (ref 1.2–8.6)

## 2023-01-12 LAB — CMP (CANCER CENTER ONLY)
ALT: 19 U/L (ref 0–44)
AST: 25 U/L (ref 15–41)
Albumin: 3.8 g/dL (ref 3.5–5.0)
Alkaline Phosphatase: 72 U/L (ref 38–126)
Anion gap: 5 (ref 5–15)
BUN: 21 mg/dL (ref 8–23)
CO2: 27 mmol/L (ref 22–32)
Calcium: 8.9 mg/dL (ref 8.9–10.3)
Chloride: 109 mmol/L (ref 98–111)
Creatinine: 0.83 mg/dL (ref 0.61–1.24)
GFR, Estimated: >60 mL/min (ref >60)
Glucose, Bld: 112 mg/dL — ABNORMAL HIGH (ref 70–99)
Potassium: 4.2 mmol/L (ref 3.5–5.1)
Sodium: 141 mmol/L (ref 135–145)
Total Bilirubin: 0.7 mg/dL (ref 0.3–1.2)
Total Protein: 6.2 g/dL — ABNORMAL LOW (ref 6.5–8.1)

## 2023-01-12 LAB — CBC WITH DIFFERENTIAL (CANCER CENTER ONLY)
Abs Immature Granulocytes: 0.02 10*3/uL (ref 0.00–0.07)
Basophils Absolute: 0.1 10*3/uL (ref 0.0–0.1)
Basophils Relative: 1 %
Eosinophils Absolute: 0.3 10*3/uL (ref 0.0–0.5)
Eosinophils Relative: 4 %
HCT: 41.9 % (ref 39.0–52.0)
Hemoglobin: 14 g/dL (ref 13.0–17.0)
Immature Granulocytes: 0 %
Lymphocytes Relative: 23 %
Lymphs Abs: 1.5 10*3/uL (ref 0.7–4.0)
MCH: 29.8 pg (ref 26.0–34.0)
MCHC: 33.4 g/dL (ref 30.0–36.0)
MCV: 89.1 fL (ref 80.0–100.0)
Monocytes Absolute: 0.6 10*3/uL (ref 0.1–1.0)
Monocytes Relative: 9 %
Neutro Abs: 4 10*3/uL (ref 1.7–7.7)
Neutrophils Relative %: 63 %
Platelet Count: 33 10*3/uL — ABNORMAL LOW (ref 150–400)
RBC: 4.7 MIL/uL (ref 4.22–5.81)
RDW: 13.5 % (ref 11.5–15.5)
WBC Count: 6.5 10*3/uL (ref 4.0–10.5)
nRBC: 0 % (ref 0.0–0.2)

## 2023-01-12 MED ORDER — CETIRIZINE HCL 10 MG/ML IV SOLN
10.0000 mg | Freq: Once | INTRAVENOUS | Status: AC
  Administered 2023-01-12: 10 mg via INTRAVENOUS
  Filled 2023-01-12: qty 1

## 2023-01-12 MED ORDER — MONTELUKAST SODIUM 10 MG PO TABS
10.0000 mg | ORAL_TABLET | Freq: Once | ORAL | Status: AC
  Administered 2023-01-12: 10 mg via ORAL
  Filled 2023-01-12: qty 1

## 2023-01-12 MED ORDER — METHYLPREDNISOLONE SODIUM SUCC 125 MG IJ SOLR
125.0000 mg | Freq: Once | INTRAMUSCULAR | Status: AC
  Administered 2023-01-12: 125 mg via INTRAVENOUS
  Filled 2023-01-12: qty 2

## 2023-01-12 MED ORDER — FAMOTIDINE IN NACL 20-0.9 MG/50ML-% IV SOLN
20.0000 mg | Freq: Once | INTRAVENOUS | Status: AC
  Administered 2023-01-12: 20 mg via INTRAVENOUS
  Filled 2023-01-12: qty 50

## 2023-01-12 MED ORDER — ACETAMINOPHEN 500 MG PO TABS
1000.0000 mg | ORAL_TABLET | Freq: Once | ORAL | Status: AC
  Administered 2023-01-12: 1000 mg via ORAL
  Filled 2023-01-12: qty 2

## 2023-01-12 MED ORDER — SODIUM CHLORIDE 0.9 % IV SOLN
375.0000 mg/m2 | Freq: Once | INTRAVENOUS | Status: AC
  Administered 2023-01-12: 1000 mg via INTRAVENOUS
  Filled 2023-01-12: qty 100

## 2023-01-12 MED ORDER — SODIUM CHLORIDE 0.9 % IV SOLN: Freq: Once | INTRAVENOUS | Status: AC

## 2023-01-12 MED ORDER — ROMIPLOSTIM INJECTION 500 MCG
2.0000 ug/kg | SUBCUTANEOUS | Status: DC
  Administered 2023-01-12: 265 ug via SUBCUTANEOUS
  Filled 2023-01-12: qty 0.53

## 2023-01-12 NOTE — Patient Instructions (Signed)
[facility]  Discharge Instructions: Thank you for choosing [facility] to provide your oncology and hematology care.   If you have a lab appointment with the Cancer Center, please go directly to the Cancer Center and check in at the registration area.   Wear comfortable clothing and clothing appropriate for easy access to any Portacath or PICC line.   We strive to give you quality time with your provider. You may need to reschedule your appointment if you arrive late (15 or more minutes).  Arriving late affects you and other patients whose appointments are after yours.  Also, if you miss three or more appointments without notifying the office, you may be dismissed from the clinic at the provider's discretion.      For prescription refill requests, have your pharmacy contact our office and allow 72 hours for refills to be completed.    Today you received the following chemotherapy and/or immunotherapy agents Rituxan      To help prevent nausea and vomiting after your treatment, we encourage you to take your nausea medication as directed.  BELOW ARE SYMPTOMS THAT SHOULD BE REPORTED IMMEDIATELY: *FEVER GREATER THAN 100.4 F (38 C) OR HIGHER *CHILLS OR SWEATING *NAUSEA AND VOMITING THAT IS NOT CONTROLLED WITH YOUR NAUSEA MEDICATION *UNUSUAL SHORTNESS OF BREATH *UNUSUAL BRUISING OR BLEEDING *URINARY PROBLEMS (pain or burning when urinating, or frequent urination) *BOWEL PROBLEMS (unusual diarrhea, constipation, pain near the anus) TENDERNESS IN MOUTH AND THROAT WITH OR WITHOUT PRESENCE OF ULCERS (sore throat, sores in mouth, or a toothache) UNUSUAL RASH, SWELLING OR PAIN  UNUSUAL VAGINAL DISCHARGE OR ITCHING   Items with * indicate a potential emergency and should be followed up as soon as possible or go to the Emergency Department if any problems should occur.  Please show the CHEMOTHERAPY ALERT CARD or IMMUNOTHERAPY ALERT CARD at check-in  to the Emergency Department and triage nurse.  Should you have questions after your visit or need to cancel or reschedule your appointment, please contact [facility]  Dept: [phone]  and follow the prompts.  Office hours are 8:00 a.m. to 4:30 p.m. Monday - Friday. Please note that voicemails left after 4:00 p.m. may not be returned until the following business day.  We are closed weekends and major holidays. You have access to a nurse at all times for urgent questions. Please call the main number to the clinic Dept: [phone] and follow the prompts.   For any non-urgent questions, you may also contact your provider using MyChart. We now offer e-Visits for anyone 55 and older to request care online for non-urgent symptoms. For details visit [email or web address].   Also download the MyChart app! Go to the app store, search "MyChart", open the app, select , and log in with your MyChart username and password.  Romiplostim Injection What is this medication? ROMIPLOSTIM (roe mi PLOE stim) treats low levels of platelets in your body caused by immune thrombocytopenia (ITP). It is prescribed when other medications have not worked or cannot be tolerated. It may also be used to help people who have been exposed to high doses of radiation. It works by increasing the amount of platelets in your blood. This lowers the risk of bleeding. This medicine may be used for other purposes; ask your health care provider or pharmacist if you have questions. COMMON BRAND NAME(S): Nplate What should I tell my care team before I take this medication?  They need to know if you have any of these conditions: Blood clots Myelodysplastic syndrome An unusual or allergic reaction to romiplostim, mannitol, other medications, foods, dyes, or preservatives Pregnant or trying to get pregnant Breast-feeding How should I use this medication? This medication is injected under  the skin. It is given by a care team in a hospital or clinic setting. A special MedGuide will be given to you before each treatment. Be sure to read this information carefully each time. Talk to your care team about the use of this medication in children. While it may be prescribed for children as young as newborns for selected conditions, precautions do apply. Overdosage: If you think you have taken too much of this medicine contact a poison control center or emergency room at once. NOTE: This medicine is only for you. Do not share this medicine with others. What if I miss a dose? Keep appointments for follow-up doses. It is important not to miss your dose. Call your care team if you are unable to keep an appointment. What may interact with this medication? Interactions are not expected. This list may not describe all possible interactions. Give your health care provider a list of all the medicines, herbs, non-prescription drugs, or dietary supplements you use. Also tell them if you smoke, drink alcohol, or use illegal drugs. Some items may interact with your medicine. What should I watch for while using this medication? Visit your care team for regular checks on your progress. You may need blood work done while you are taking this medication. Your condition will be monitored carefully while you are receiving this medication. It is important not to miss any appointments. What side effects may I notice from receiving this medication? Side effects that you should report to your care team as soon as possible: Allergic reactions--skin rash, itching, hives, swelling of the face, lips, tongue, or throat Blood clot--pain, swelling, or warmth in the leg, shortness of breath, chest pain Side effects that usually do not require medical attention (report to your care team if they continue or are bothersome): Dizziness Joint pain Muscle pain Pain in the hands or feet Stomach pain Trouble sleeping This  list may not describe all possible side effects. Call your doctor for medical advice about side effects. You may report side effects to FDA at 1-800-FDA-1088. Where should I keep my medication? This medication is given in a hospital or clinic. It will not be stored at home. NOTE: This sheet is a summary. It may not cover all possible information. If you have questions about this medicine, talk to your doctor, pharmacist, or health care provider.  2024 Elsevier/Gold Standard (2021-08-22 00:00:00)

## 2023-01-12 NOTE — Progress Notes (Signed)
HEMATOLOGY/ONCOLOGY CLINIC VISIT NOTE  Date of Service: 01/12/2023  Patient Care Team: Merleen Milliner, MD as PCP - General (Cardiology) [name], MD as Consulting Physician (Hematology)  CHIEF COMPLAINTS/PURPOSE OF CONSULTATION:  Evaluation and management of ITP  HISTORY OF PRESENTING ILLNESS:  Joel Hoffman is a wonderful 62 y.o. male who has been referred to [place] by Dr Bradley Ferris MD for evaluation and management of thrombocytopenia.   Patient has a history of hypertension, dyslipidemia, coronary disease status post MI with 2 stents placed more than 5 years ago, remote history of gastric ulcer.   Patient presented to the emergency room with right lower extremity pain and warmth redness and increasing discomfort.  He notes that he had had 2 traumas to the same area on his right leg after which he started having leg pain and swelling.  He was started on doxycycline as outpatient and noted that this failed to resolve his leg pain which was worsening and therefore he came to the emergency room.   In the emergency room the patient was noted to have severe right lower extremity cellulitis and was started on IV ceftriaxone and vancomycin. He was also noted on labs to have severe thrombocytopenia with platelets less than 5k  INTERVAL HISTORY:   Joel Hoffman is a 62 y.o. 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   Patient was last seen by me on 12/28/2022 and reported having a fall causing two cuts in his right lower extremity with plenty of bleeding and a bruise on the second toe of his left foot.   Today, he is here for toxicity check for cycle 4 day 1 of treatment. Patient has tolerated his last Rituxan infusion with no toxicity issues  He reports that sulfadiazine cream has healed his wound from 2 weeks ago. He denies any redness in the area of his wound and he denies any additional injuries or bleeding issues  and his LE swelling is improving.   He complains of discomfort/soreness in his right jaw. Patient denies any back pain, abdominal pain, change in bowel habits, blood in stool, black stools, or blood in the urine.   Patient has previously received an Nplate shot.   MEDICAL HISTORY:  Past Medical History:  Diagnosis Date   Coronary artery disease    Gastric ulcer    Heart attack (HCC)    Hypercholesteremia    Hypertension     SURGICAL HISTORY: Past Surgical History:  Procedure Laterality Date   CORONARY ANGIOPLASTY WITH STENT PLACEMENT     TONSILLECTOMY      SOCIAL HISTORY: Social History   Socioeconomic History   Marital status: Single    Spouse name: Not on file   Number of children: Not on file   Years of education: Not on file   Highest education level: Not on file  Occupational History   Occupation: Ecologist: Production assistant, radio FOR SELF EMPLOYED    Comment: Retired from [employer]  Tobacco Use   Smoking status: Never   Smokeless tobacco: Never  Vaping Use   Vaping status: Never Used  Substance and Sexual Activity   Alcohol use: No    Comment: "Quit 24 years ago"   Drug use: No   Sexual activity: Not Currently  Other Topics Concern   Not on file  Social History Narrative   Not on file   Social Determinants of Health   Financial Resource Strain:  Not on file  Food Insecurity: No Food Insecurity (08/21/2022)   Hunger Vital Sign    Worried About Running Out of Food in the Last Year: Never true    Ran Out of Food in the Last Year: Never true  Transportation Needs: No Transportation Needs (08/21/2022)   PRAPARE - Administrator, Civil Service (Medical): No    Lack of Transportation (Non-Medical): No  Physical Activity: Not on file  Stress: Not on file  Social Connections: Not on file  Intimate Partner Violence: Not At Risk (08/21/2022)   Humiliation, Afraid, Rape, and Kick questionnaire    Fear of Current or Ex-Partner: No    Emotionally  Abused: No    Physically Abused: No    Sexually Abused: No    FAMILY HISTORY: Family History  Problem Relation Age of Onset   Dementia Mother    Cancer Father    Diabetes Father    Emphysema Father    Colon cancer Father    Hypertension Brother     ALLERGIES:  is allergic to ampicillin.  MEDICATIONS:  Current Outpatient Medications  Medication Sig Dispense Refill   acetaminophen (TYLENOL) 500 MG tablet Take 500 mg by mouth 2 (two) times daily as needed for mild pain or headache.     atorvastatin (LIPITOR) 80 MG tablet Take 80 mg by mouth every evening.     carvedilol (COREG) 12.5 MG tablet Take 12.5 mg by mouth 2 (two) times daily.     doxycycline (VIBRA-TABS) 100 MG tablet Take 1 tablet (100 mg total) by mouth 2 (two) times daily. 20 tablet 0   Emollient (CETAPHIL) cream Apply 1 Application topically daily as needed (for dryness- affected areas).     lisinopril (ZESTRIL) 10 MG tablet Take 10 mg by mouth daily.     Multiple Vitamins-Minerals (CENTRUM SILVER 50+MEN PO) Take 1 tablet by mouth daily with breakfast.     ondansetron (ZOFRAN) 8 MG tablet Take 1 tablet (8 mg total) by mouth every 8 (eight) hours as needed for nausea or vomiting. 20 tablet 0   silver sulfADIAZINE (SILVADENE) 1 % cream Apply topically daily. 50 g 0   No current facility-administered medications for this visit.    REVIEW OF SYSTEMS:    10 Point review of Systems was done is negative except as noted above.   PHYSICAL EXAMINATION:  .BP 138/79 (BP Location: Left Arm, Patient Position: Sitting)   Pulse 72   Temp 98.5 F (36.9 C) (Oral)   Resp 18   Ht 6' (1.829 m)   Wt 294 lb 9.6 oz (133.6 kg)   SpO2 100%   BMI 39.95 kg/m   GENERAL:alert, in no acute distress and comfortable SKIN: no acute rashes, no significant lesions EYES: conjunctiva are pink and non-injected, sclera anicteric OROPHARYNX: MMM, no exudates, no oropharyngeal erythema or ulceration NECK: supple, no JVD LYMPH:  no palpable  lymphadenopathy in the cervical, axillary or inguinal regions LUNGS: clear to auscultation b/l with normal respiratory effort HEART: regular rate & rhythm ABDOMEN:  normoactive bowel sounds , non tender, not distended. Extremity: no pedal edema PSYCH: alert & oriented x 3 with fluent speech NEURO: no focal motor/sensory deficits   LABORATORY DATA:  I have reviewed the data as listed .    Latest Ref Rng & Units 01/12/2023   10:07 AM 01/03/2023   11:55 AM 12/28/2022    7:07 AM  CBC  WBC 4.0 - 10.5 K/uL 6.5  8.2  7.4   Hemoglobin  13.0 - 17.0 g/dL 40.9  81.1  91.4   Hematocrit 39.0 - 52.0 % 41.9  42.7  44.8   Platelets 150 - 400 K/uL 33  88  108    .    Latest Ref Rng & Units 01/12/2023   10:07 AM 01/03/2023   11:55 AM 12/28/2022    7:07 AM  CMP  Glucose 70 - 99 mg/dL 782  92  956   BUN 8 - 23 mg/dL 21  20  19    Creatinine 0.61 - 1.24 mg/dL 2.13  0.86  5.78   Sodium 135 - 145 mmol/L 141  141  143   Potassium 3.5 - 5.1 mmol/L 4.2  4.0  3.7   Chloride 98 - 111 mmol/L 109  109  109   CO2 22 - 32 mmol/L 27  27  25    Calcium 8.9 - 10.3 mg/dL 8.9  9.2  9.3   Total Protein 6.5 - 8.1 g/dL 6.2  6.3  6.6   Total Bilirubin 0.3 - 1.2 mg/dL 0.7  0.6  0.7   Alkaline Phos 38 - 126 U/L 72  76  73   AST 15 - 41 U/L 25  23  26    ALT 0 - 44 U/L 19  16  20     Immature platelet fraction 6.9$   RADIOGRAPHIC STUDIES: I have personally reviewed the radiological images as listed and agreed with the findings in the report. No results found.  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 Immature platelet fraction of 37.2% suggesting excellent bone marrow response. Use of high doses of ibuprofen over the last 5 days could also be a concern. Normal TSH B12 and folate levels Monospot test negative All of  the patients questions were answered with apparent satisfaction. The patient knows to call the clinic with any problems, questions or concerns. CT abdomen pelvis done today shows no hepatosplenomegaly or signs of chronic liver disease.   #2 right lower extremity cellulitis following injury to the right leg with skin breach.-On ceftriaxone and vancomycin per hospitalist.  #3 hypertension #4 coronary disease status post remote history of PCI x 2 #5 obesity  PLAN:  -Discussed lab results on 01/12/23 in detail with patient. CBC showed WBC of 6.5K, hemoglobin of 14.0, and platelets of 33K. -platelets continue to remain low at this time. His platelets were previously 88K on 01/03/2023.  -patient has tolerated Rituxan well with no major toxicities -Proceed with cycle 4 of Rituxan treatment -recommend a well-balanced diet to optimize nutrition -continue vitamin B complex, one tablet daily to ensure no that there are no subtle vitamin B deficiencies -Rituxan has not reached peak effect yet at this time.  -discussed that there may be a role for an additional plan to manage platelet levels. May consider Nplate once a week in case it is needed. If his platelet levels continue to be below 50K in one month, may consider Promacta.  -discussed option of wearing long socks for additional protection to the lower extremities -will refill sulfadiazine cream refill per patient's request -answered all of patient's questions in detail  -advised patient to be seen by a dentist for further evaluation of his right-sided jaw discomfort/soreness   FOLLOW-UP: Start weekly Nplate x 6 Labs weekly MD visit in 2 weeks  The total  time spent in the appointment was 30 minutes* .  All of the patient's questions were answered with apparent satisfaction. The patient knows to call the clinic with any problems, questions or concerns.   Wyvonnia Lora MD MS AAHIVMS [facility] Hematology/Oncology Physician [facility]  .*Total Encounter Time as defined by the Centers for Medicare and Medicaid Services includes, in addition to the face-to-face time of a patient visit (documented in the note above) non-face-to-face time: obtaining and reviewing outside history, ordering and reviewing medications, tests or procedures, care coordination (communications with other health care professionals or caregivers) and documentation in the medical record.    I,Mitra Faeizi,acting as a Neurosurgeon for Wyvonnia Lora, MD.,have documented all relevant documentation on the behalf of Wyvonnia Lora, MD,as directed by  Wyvonnia Lora, MD while in the presence of Wyvonnia Lora, MD.  .I have reviewed the above documentation for accuracy and completeness, and I agree with the above. [name] MD

## 2023-01-18 ENCOUNTER — Encounter: Payer: Self-pay | Admitting: Hematology and Oncology

## 2023-01-18 ENCOUNTER — Encounter: Payer: Self-pay | Admitting: Hematology

## 2023-01-19 ENCOUNTER — Inpatient Hospital Stay

## 2023-01-19 ENCOUNTER — Other Ambulatory Visit: Payer: Self-pay

## 2023-01-19 VITALS — BP 152/93 | HR 77 | Temp 98.2°F | Resp 18

## 2023-01-19 DIAGNOSIS — D693 Immune thrombocytopenic purpura: Secondary | ICD-10-CM

## 2023-01-19 DIAGNOSIS — Z5111 Encounter for antineoplastic chemotherapy: Secondary | ICD-10-CM | POA: Diagnosis not present

## 2023-01-19 LAB — CMP (CANCER CENTER ONLY)
ALT: 19 U/L (ref 0–44)
AST: 26 U/L (ref 15–41)
Albumin: 4.3 g/dL (ref 3.5–5.0)
Alkaline Phosphatase: 73 U/L (ref 38–126)
Anion gap: 7 (ref 5–15)
BUN: 21 mg/dL (ref 8–23)
CO2: 28 mmol/L (ref 22–32)
Calcium: 9.4 mg/dL (ref 8.9–10.3)
Chloride: 108 mmol/L (ref 98–111)
Creatinine: 0.87 mg/dL (ref 0.61–1.24)
GFR, Estimated: >60 mL/min (ref >60)
Glucose, Bld: 98 mg/dL (ref 70–99)
Potassium: 4.6 mmol/L (ref 3.5–5.1)
Sodium: 143 mmol/L (ref 135–145)
Total Bilirubin: 0.5 mg/dL (ref 0.3–1.2)
Total Protein: 6.9 g/dL (ref 6.5–8.1)

## 2023-01-19 LAB — IMMATURE PLATELET FRACTION: Immature Platelet Fraction: 7.3 % (ref 1.2–8.6)

## 2023-01-19 LAB — CBC WITH DIFFERENTIAL (CANCER CENTER ONLY)
Abs Immature Granulocytes: 0.04 10*3/uL (ref 0.00–0.07)
Basophils Absolute: 0.1 10*3/uL (ref 0.0–0.1)
Basophils Relative: 1 %
Eosinophils Absolute: 0.4 10*3/uL (ref 0.0–0.5)
Eosinophils Relative: 5 %
HCT: 44.6 % (ref 39.0–52.0)
Hemoglobin: 14.8 g/dL (ref 13.0–17.0)
Immature Granulocytes: 1 %
Lymphocytes Relative: 23 %
Lymphs Abs: 2 10*3/uL (ref 0.7–4.0)
MCH: 29.8 pg (ref 26.0–34.0)
MCHC: 33.2 g/dL (ref 30.0–36.0)
MCV: 89.9 fL (ref 80.0–100.0)
Monocytes Absolute: 0.9 10*3/uL (ref 0.1–1.0)
Monocytes Relative: 10 %
Neutro Abs: 5.3 10*3/uL (ref 1.7–7.7)
Neutrophils Relative %: 60 %
Platelet Count: 173 10*3/uL (ref 150–400)
RBC: 4.96 MIL/uL (ref 4.22–5.81)
RDW: 13.9 % (ref 11.5–15.5)
WBC Count: 8.6 10*3/uL (ref 4.0–10.5)
nRBC: 0 % (ref 0.0–0.2)

## 2023-01-19 MED ORDER — ROMIPLOSTIM 250 MCG ~~LOC~~ SOLR
1.0000 ug/kg | SUBCUTANEOUS | Status: DC
  Administered 2023-01-19: 135 ug via SUBCUTANEOUS
  Filled 2023-01-19: qty 0.27

## 2023-01-19 NOTE — Patient Instructions (Signed)
Romiplostim Injection What is this medication? ROMIPLOSTIM (roe mi PLOE stim) treats low levels of platelets in your body caused by immune thrombocytopenia (ITP). It is prescribed when other medications have not worked or cannot be tolerated. It may also be used to help people who have been exposed to high doses of radiation. It works by increasing the amount of platelets in your blood. This lowers the risk of bleeding. This medicine may be used for other purposes; ask your health care provider or pharmacist if you have questions. COMMON BRAND NAME(S): Nplate What should I tell my care team before I take this medication? They need to know if you have any of these conditions: Blood clots Myelodysplastic syndrome An unusual or allergic reaction to romiplostim, mannitol, other medications, foods, dyes, or preservatives Pregnant or trying to get pregnant Breast-feeding How should I use this medication? This medication is injected under the skin. It is given by a care team in a hospital or clinic setting. A special MedGuide will be given to you before each treatment. Be sure to read this information carefully each time. Talk to your care team about the use of this medication in children. While it may be prescribed for children as young as newborns for selected conditions, precautions do apply. Overdosage: If you think you have taken too much of this medicine contact a poison control center or emergency room at once. NOTE: This medicine is only for you. Do not share this medicine with others. What if I miss a dose? Keep appointments for follow-up doses. It is important not to miss your dose. Call your care team if you are unable to keep an appointment. What may interact with this medication? Interactions are not expected. This list may not describe all possible interactions. Give your health care provider a list of all the medicines, herbs, non-prescription drugs, or dietary supplements you use. Also  tell them if you smoke, drink alcohol, or use illegal drugs. Some items may interact with your medicine. What should I watch for while using this medication? Visit your care team for regular checks on your progress. You may need blood work done while you are taking this medication. Your condition will be monitored carefully while you are receiving this medication. It is important not to miss any appointments. What side effects may I notice from receiving this medication? Side effects that you should report to your care team as soon as possible: Allergic reactions--skin rash, itching, hives, swelling of the face, lips, tongue, or throat Blood clot--pain, swelling, or warmth in the leg, shortness of breath, chest pain Side effects that usually do not require medical attention (report to your care team if they continue or are bothersome): Dizziness Joint pain Muscle pain Pain in the hands or feet Stomach pain Trouble sleeping This list may not describe all possible side effects. Call your doctor for medical advice about side effects. You may report side effects to FDA at 1-800-FDA-1088. Where should I keep my medication? This medication is given in a hospital or clinic. It will not be stored at home. NOTE: This sheet is a summary. It may not cover all possible information. If you have questions about this medicine, talk to your doctor, pharmacist, or health care provider.  2024 Elsevier/Gold Standard (2021-08-22 00:00:00)

## 2023-01-19 NOTE — Progress Notes (Signed)
Give nplate 56mcg/kg per Dr Candise Che

## 2023-01-26 ENCOUNTER — Other Ambulatory Visit: Payer: Self-pay

## 2023-01-26 ENCOUNTER — Inpatient Hospital Stay

## 2023-01-26 VITALS — BP 174/102 | HR 71 | Temp 98.3°F | Resp 18 | Wt 285.0 lb

## 2023-01-26 DIAGNOSIS — D693 Immune thrombocytopenic purpura: Secondary | ICD-10-CM

## 2023-01-26 DIAGNOSIS — Z5111 Encounter for antineoplastic chemotherapy: Secondary | ICD-10-CM | POA: Diagnosis not present

## 2023-01-26 LAB — CMP (CANCER CENTER ONLY)
ALT: 24 U/L (ref 0–44)
AST: 34 U/L (ref 15–41)
Albumin: 4 g/dL (ref 3.5–5.0)
Alkaline Phosphatase: 69 U/L (ref 38–126)
Anion gap: 5 (ref 5–15)
BUN: 18 mg/dL (ref 8–23)
CO2: 29 mmol/L (ref 22–32)
Calcium: 9.3 mg/dL (ref 8.9–10.3)
Chloride: 108 mmol/L (ref 98–111)
Creatinine: 0.88 mg/dL (ref 0.61–1.24)
GFR, Estimated: >60 mL/min (ref >60)
Glucose, Bld: 95 mg/dL (ref 70–99)
Potassium: 4.5 mmol/L (ref 3.5–5.1)
Sodium: 142 mmol/L (ref 135–145)
Total Bilirubin: 0.5 mg/dL (ref 0.3–1.2)
Total Protein: 6.5 g/dL (ref 6.5–8.1)

## 2023-01-26 LAB — CBC WITH DIFFERENTIAL (CANCER CENTER ONLY)
Abs Immature Granulocytes: 0.01 10*3/uL (ref 0.00–0.07)
Basophils Absolute: 0.1 10*3/uL (ref 0.0–0.1)
Basophils Relative: 1 %
Eosinophils Absolute: 0.3 10*3/uL (ref 0.0–0.5)
Eosinophils Relative: 5 %
HCT: 43.6 % (ref 39.0–52.0)
Hemoglobin: 14.3 g/dL (ref 13.0–17.0)
Immature Granulocytes: 0 %
Lymphocytes Relative: 23 %
Lymphs Abs: 1.6 10*3/uL (ref 0.7–4.0)
MCH: 29.5 pg (ref 26.0–34.0)
MCHC: 32.8 g/dL (ref 30.0–36.0)
MCV: 90.1 fL (ref 80.0–100.0)
Monocytes Absolute: 0.6 10*3/uL (ref 0.1–1.0)
Monocytes Relative: 9 %
Neutro Abs: 4.3 10*3/uL (ref 1.7–7.7)
Neutrophils Relative %: 62 %
Platelet Count: 161 10*3/uL (ref 150–400)
RBC: 4.84 MIL/uL (ref 4.22–5.81)
RDW: 13.8 % (ref 11.5–15.5)
WBC Count: 6.9 10*3/uL (ref 4.0–10.5)
nRBC: 0 % (ref 0.0–0.2)

## 2023-01-26 LAB — IMMATURE PLATELET FRACTION: Immature Platelet Fraction: 6.8 % (ref 1.2–8.6)

## 2023-01-26 MED ORDER — ROMIPLOSTIM INJECTION 500 MCG
2.0000 ug/kg | SUBCUTANEOUS | Status: DC
  Administered 2023-01-26: 260 ug via SUBCUTANEOUS
  Filled 2023-01-26: qty 0.52

## 2023-02-01 ENCOUNTER — Other Ambulatory Visit: Payer: Self-pay

## 2023-02-01 DIAGNOSIS — D693 Immune thrombocytopenic purpura: Secondary | ICD-10-CM

## 2023-02-02 ENCOUNTER — Inpatient Hospital Stay (HOSPITAL_BASED_OUTPATIENT_CLINIC_OR_DEPARTMENT_OTHER): Admitting: Hematology

## 2023-02-02 ENCOUNTER — Inpatient Hospital Stay

## 2023-02-02 ENCOUNTER — Inpatient Hospital Stay: Attending: Hematology

## 2023-02-02 ENCOUNTER — Inpatient Hospital Stay: Admitting: Hematology

## 2023-02-02 VITALS — BP 144/92 | HR 60 | Temp 97.3°F | Resp 18 | Wt 293.1 lb

## 2023-02-02 DIAGNOSIS — D693 Immune thrombocytopenic purpura: Secondary | ICD-10-CM

## 2023-02-02 LAB — CMP (CANCER CENTER ONLY)
ALT: 19 U/L (ref 0–44)
AST: 26 U/L (ref 15–41)
Albumin: 4 g/dL (ref 3.5–5.0)
Alkaline Phosphatase: 68 U/L (ref 38–126)
Anion gap: 5 (ref 5–15)
BUN: 17 mg/dL (ref 8–23)
CO2: 27 mmol/L (ref 22–32)
Calcium: 9.3 mg/dL (ref 8.9–10.3)
Chloride: 110 mmol/L (ref 98–111)
Creatinine: 0.84 mg/dL (ref 0.61–1.24)
GFR, Estimated: >60 mL/min (ref >60)
Glucose, Bld: 87 mg/dL (ref 70–99)
Potassium: 4.1 mmol/L (ref 3.5–5.1)
Sodium: 142 mmol/L (ref 135–145)
Total Bilirubin: 0.6 mg/dL (ref 0.3–1.2)
Total Protein: 6.2 g/dL — ABNORMAL LOW (ref 6.5–8.1)

## 2023-02-02 LAB — IMMATURE PLATELET FRACTION: Immature Platelet Fraction: 7.3 % (ref 1.2–8.6)

## 2023-02-02 LAB — CBC WITH DIFFERENTIAL (CANCER CENTER ONLY)
Abs Immature Granulocytes: 0.02 10*3/uL (ref 0.00–0.07)
Basophils Absolute: 0.1 10*3/uL (ref 0.0–0.1)
Basophils Relative: 1 %
Eosinophils Absolute: 0.3 10*3/uL (ref 0.0–0.5)
Eosinophils Relative: 5 %
HCT: 42.7 % (ref 39.0–52.0)
Hemoglobin: 14.3 g/dL (ref 13.0–17.0)
Immature Granulocytes: 0 %
Lymphocytes Relative: 22 %
Lymphs Abs: 1.3 10*3/uL (ref 0.7–4.0)
MCH: 30 pg (ref 26.0–34.0)
MCHC: 33.5 g/dL (ref 30.0–36.0)
MCV: 89.7 fL (ref 80.0–100.0)
Monocytes Absolute: 0.7 10*3/uL (ref 0.1–1.0)
Monocytes Relative: 11 %
Neutro Abs: 3.7 10*3/uL (ref 1.7–7.7)
Neutrophils Relative %: 61 %
Platelet Count: 188 10*3/uL (ref 150–400)
RBC: 4.76 MIL/uL (ref 4.22–5.81)
RDW: 13.6 % (ref 11.5–15.5)
WBC Count: 6 10*3/uL (ref 4.0–10.5)
nRBC: 0 % (ref 0.0–0.2)

## 2023-02-02 MED ORDER — ROMIPLOSTIM 250 MCG ~~LOC~~ SOLR
1.0000 ug/kg | SUBCUTANEOUS | Status: DC
  Administered 2023-02-02: 135 ug via SUBCUTANEOUS
  Filled 2023-02-02: qty 0.27

## 2023-02-02 NOTE — Progress Notes (Signed)
Per Dr. Candise Che, give 1 mcg/kg of nplate.  Demetrius Charity, PharmD

## 2023-02-02 NOTE — Progress Notes (Signed)
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 male who has been referred to [place] by Dr Bradley Ferris MD for evaluation and management of thrombocytopenia.   Patient has a history of hypertension, dyslipidemia, coronary disease status post MI with 2 stents placed more than 5 years ago, remote history of gastric ulcer.   Patient presented to the emergency room with right lower extremity pain and warmth redness and increasing discomfort.  He notes that he had had 2 traumas to the same area on his right leg after which he started having leg pain and swelling.  He was started on doxycycline as outpatient and noted that this failed to resolve his leg pain which was worsening and therefore he came to the emergency room.   In the emergency room the patient was noted to have severe right lower extremity cellulitis and was started on IV ceftriaxone and vancomycin. He was also noted on labs to have severe thrombocytopenia with platelets less than 5k  INTERVAL HISTORY:   [name] is a 62 y.o. 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   Patient was last seen by me on 01/12/2023 and complained of discomfort/soreness in his right jaw.  Patient has received Nplate shots over the last couple of weeks. He denies any bleeding issues, new injuries, infection issues, or OTC pain medications.   His previous wound has healed. Patient reports minor scratches in lower extremity from his cat, which are not concerning. He reports mild leg swelling.  MEDICAL HISTORY:  Past Medical History:  Diagnosis Date   Coronary artery disease    Gastric ulcer     Heart attack (HCC)    Hypercholesteremia    Hypertension     SURGICAL HISTORY: Past Surgical History:  Procedure Laterality Date   CORONARY ANGIOPLASTY WITH STENT PLACEMENT     TONSILLECTOMY      SOCIAL HISTORY: Social History   Socioeconomic History   Marital status: Single    Spouse name: Not on file   Number of children: Not on file   Years of education: Not on file   Highest education level: Not on file  Occupational History   Occupation: Ecologist: Production assistant, radio FOR SELF EMPLOYED    Comment: Retired from [employer]  Tobacco Use   Smoking status: Never   Smokeless tobacco: Never  Vaping Use   Vaping status: Never Used  Substance and Sexual Activity   Alcohol use: No    Comment: "Quit 24 years ago"   Drug use: No   Sexual activity: Not Currently  Other Topics Concern   Not on file  Social History Narrative   Not on file   Social Determinants of Health   Financial Resource Strain: Not on file  Food Insecurity: No Food Insecurity (08/21/2022)   Hunger Vital Sign    Worried About Running Out of Food in the Last Year: Never true    Ran Out of Food in the Last Year: Never true  Transportation Needs: No Transportation Needs (08/21/2022)   PRAPARE - Administrator, Civil Service (Medical): No    Lack of Transportation (Non-Medical): No  Physical Activity: Not on  file  Stress: Not on file  Social Connections: Not on file  Intimate Partner Violence: Not At Risk (08/21/2022)   Humiliation, Afraid, Rape, and Kick questionnaire    Fear of Current or Ex-Partner: No    Emotionally Abused: No    Physically Abused: No    Sexually Abused: No    FAMILY HISTORY: Family History  Problem Relation Age of Onset   Dementia Mother    Cancer Father    Diabetes Father    Emphysema Father    Colon cancer Father    Hypertension Brother     ALLERGIES:  is allergic to ampicillin.  MEDICATIONS:  Current Outpatient Medications  Medication Sig  Dispense Refill   acetaminophen (TYLENOL) 500 MG tablet Take 500 mg by mouth 2 (two) times daily as needed for mild pain or headache.     atorvastatin (LIPITOR) 80 MG tablet Take 80 mg by mouth every evening.     carvedilol (COREG) 12.5 MG tablet Take 12.5 mg by mouth 2 (two) times daily.     doxycycline (VIBRA-TABS) 100 MG tablet Take 1 tablet (100 mg total) by mouth 2 (two) times daily. 20 tablet 0   Emollient (CETAPHIL) cream Apply 1 Application topically daily as needed (for dryness- affected areas).     lisinopril (ZESTRIL) 10 MG tablet Take 10 mg by mouth daily.     Multiple Vitamins-Minerals (CENTRUM SILVER 50+MEN PO) Take 1 tablet by mouth daily with breakfast.     ondansetron (ZOFRAN) 8 MG tablet Take 1 tablet (8 mg total) by mouth every 8 (eight) hours as needed for nausea or vomiting. 20 tablet 0   silver sulfADIAZINE (SILVADENE) 1 % cream Apply topically daily. 50 g 0   No current facility-administered medications for this visit.    REVIEW OF SYSTEMS:    10 Point review of Systems was done is negative except as noted above.   PHYSICAL EXAMINATION:  .There were no vitals taken for this visit.   GENERAL:alert, in no acute distress and comfortable SKIN: no acute rashes, no significant lesions EYES: conjunctiva are pink and non-injected, sclera anicteric OROPHARYNX: MMM, no exudates, no oropharyngeal erythema or ulceration NECK: supple, no JVD LYMPH:  no palpable lymphadenopathy in the cervical, axillary or inguinal regions LUNGS: clear to auscultation b/l with normal respiratory effort HEART: regular rate & rhythm ABDOMEN:  normoactive bowel sounds , non tender, not distended. Extremity: no pedal edema PSYCH: alert & oriented x 3 with fluent speech NEURO: no focal motor/sensory deficits   LABORATORY DATA:  I have reviewed the data as listed .    Latest Ref Rng & Units 02/02/2023    7:56 AM 01/26/2023    1:36 PM 01/19/2023    3:54 PM  CBC  WBC 4.0 - 10.5 K/uL 6.0  6.9   8.6   Hemoglobin 13.0 - 17.0 g/dL 16.1  09.6  04.5   Hematocrit 39.0 - 52.0 % 42.7  43.6  44.6   Platelets 150 - 400 K/uL 188  161  173    .    Latest Ref Rng & Units 02/02/2023    7:56 AM 01/26/2023    1:36 PM 01/19/2023    3:54 PM  CMP  Glucose 70 - 99 mg/dL 87  95  98   BUN 8 - 23 mg/dL 17  18  21    Creatinine 0.61 - 1.24 mg/dL 4.09  8.11  9.14   Sodium 135 - 145 mmol/L 142  142  143   Potassium  3.5 - 5.1 mmol/L 4.1  4.5  4.6   Chloride 98 - 111 mmol/L 110  108  108   CO2 22 - 32 mmol/L 27  29  28    Calcium 8.9 - 10.3 mg/dL 9.3  9.3  9.4   Total Protein 6.5 - 8.1 g/dL 6.2  6.5  6.9   Total Bilirubin 0.3 - 1.2 mg/dL 0.6  0.5  0.5   Alkaline Phos 38 - 126 U/L 68  69  73   AST 15 - 41 U/L 26  34  26   ALT 0 - 44 U/L 19  24  19     Immature platelet fraction 6.9$   RADIOGRAPHIC STUDIES: I have personally reviewed the radiological images as listed and agreed with the findings in the report. No results found.  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 Immature platelet fraction of 37.2% suggesting excellent bone marrow response. Use of high doses of ibuprofen over the last 5 days could also be a concern. Normal TSH B12 and folate levels Monospot test negative All of the patients questions were answered with apparent satisfaction. The patient knows to call the clinic with any problems, questions or concerns. CT abdomen pelvis done today shows no hepatosplenomegaly or signs of chronic liver disease.   #2 right lower extremity cellulitis following injury to the right leg with skin breach.-On ceftriaxone and vancomycin per hospitalist.  #3 hypertension #4 coronary disease status post remote history of PCI x 2 #5 obesity  PLAN:  -Discussed lab results on 02/02/23 in detail  with patient. CBC showed WBC of 6.0K, hemoglobin of 14.3, and platelets of 188K. -platelets normal -CMP normal -Immature platelet fraction normal -he did receive Nplate shot last week -would recommend to proceed  with 1 Nplate shot today at reduced dose of 74mcg/kg and will monitor in 2 weeks. If platelets are normal at that time, may space out visits. If platelets drop, there may be a role to switch to a pill -discussed option to stop Nplate and monitor, which would not be unreasonable -recommend graded knee-high compression socks on daily basis to manage bilateral lower extremity edema and keep pressure form veins from being high.   FOLLOW-UP: Last Nplate today. Cancel currently scheduled appointments for Nplate and labs and replace with labs and MD visit in 2 weeks  The total time spent in the appointment was 20 minutes* .  All of the patient's questions were answered with apparent satisfaction. The patient knows to call the clinic with any problems, questions or concerns.   Wyvonnia Lora MD MS AAHIVMS [facility] Hematology/Oncology Physician [facility]  .*Total Encounter Time as defined by the Centers for Medicare and Medicaid Services includes, in addition to the face-to-face time of a patient visit (documented in the note above) non-face-to-face time: obtaining and reviewing outside history, ordering and reviewing medications, tests or procedures, care coordination (communications with other health care professionals or caregivers) and documentation in the medical record.    I,Mitra Faeizi,acting as a Neurosurgeon for Wyvonnia Lora, MD.,have documented all relevant documentation on the behalf of Wyvonnia Lora, MD,as directed by  Wyvonnia Lora, MD while in the presence of Wyvonnia Lora, MD.  .I have reviewed the above documentation for accuracy and completeness, and I agree with the above. [name]  MD

## 2023-02-08 ENCOUNTER — Encounter: Payer: Self-pay | Admitting: Hematology

## 2023-02-08 ENCOUNTER — Encounter: Payer: Self-pay | Admitting: Hematology and Oncology

## 2023-02-09 ENCOUNTER — Inpatient Hospital Stay

## 2023-02-14 ENCOUNTER — Other Ambulatory Visit: Payer: Self-pay

## 2023-02-14 DIAGNOSIS — D693 Immune thrombocytopenic purpura: Secondary | ICD-10-CM

## 2023-02-16 ENCOUNTER — Inpatient Hospital Stay

## 2023-02-16 ENCOUNTER — Telehealth: Payer: Self-pay | Admitting: Pharmacist

## 2023-02-16 ENCOUNTER — Encounter: Payer: Self-pay | Admitting: Hematology

## 2023-02-16 ENCOUNTER — Inpatient Hospital Stay (HOSPITAL_BASED_OUTPATIENT_CLINIC_OR_DEPARTMENT_OTHER): Admitting: Hematology

## 2023-02-16 ENCOUNTER — Other Ambulatory Visit (HOSPITAL_COMMUNITY): Payer: Self-pay

## 2023-02-16 ENCOUNTER — Encounter: Payer: Self-pay | Admitting: Hematology and Oncology

## 2023-02-16 ENCOUNTER — Telehealth: Payer: Self-pay | Admitting: Pharmacy Technician

## 2023-02-16 VITALS — BP 164/96 | HR 60 | Temp 97.7°F | Resp 20 | Wt 298.9 lb

## 2023-02-16 DIAGNOSIS — D693 Immune thrombocytopenic purpura: Secondary | ICD-10-CM | POA: Diagnosis not present

## 2023-02-16 LAB — CBC WITH DIFFERENTIAL (CANCER CENTER ONLY)
Abs Immature Granulocytes: 0.01 10*3/uL (ref 0.00–0.07)
Basophils Absolute: 0.1 10*3/uL (ref 0.0–0.1)
Basophils Relative: 1 %
Eosinophils Absolute: 0.5 10*3/uL (ref 0.0–0.5)
Eosinophils Relative: 6 %
HCT: 42 % (ref 39.0–52.0)
Hemoglobin: 14.1 g/dL (ref 13.0–17.0)
Immature Granulocytes: 0 %
Lymphocytes Relative: 24 %
Lymphs Abs: 1.7 10*3/uL (ref 0.7–4.0)
MCH: 30 pg (ref 26.0–34.0)
MCHC: 33.6 g/dL (ref 30.0–36.0)
MCV: 89.4 fL (ref 80.0–100.0)
Monocytes Absolute: 0.8 10*3/uL (ref 0.1–1.0)
Monocytes Relative: 11 %
Neutro Abs: 4 10*3/uL (ref 1.7–7.7)
Neutrophils Relative %: 58 %
Platelet Count: 39 10*3/uL — ABNORMAL LOW (ref 150–400)
RBC: 4.7 MIL/uL (ref 4.22–5.81)
RDW: 13.7 % (ref 11.5–15.5)
WBC Count: 7 10*3/uL (ref 4.0–10.5)
nRBC: 0 % (ref 0.0–0.2)

## 2023-02-16 LAB — CMP (CANCER CENTER ONLY)
ALT: 21 U/L (ref 0–44)
AST: 33 U/L (ref 15–41)
Albumin: 4.1 g/dL (ref 3.5–5.0)
Alkaline Phosphatase: 84 U/L (ref 38–126)
Anion gap: 4 — ABNORMAL LOW (ref 5–15)
BUN: 15 mg/dL (ref 8–23)
CO2: 30 mmol/L (ref 22–32)
Calcium: 9.6 mg/dL (ref 8.9–10.3)
Chloride: 106 mmol/L (ref 98–111)
Creatinine: 0.8 mg/dL (ref 0.61–1.24)
GFR, Estimated: >60 mL/min (ref >60)
Glucose, Bld: 88 mg/dL (ref 70–99)
Potassium: 5 mmol/L (ref 3.5–5.1)
Sodium: 140 mmol/L (ref 135–145)
Total Bilirubin: 0.7 mg/dL (ref 0.3–1.2)
Total Protein: 6.3 g/dL — ABNORMAL LOW (ref 6.5–8.1)

## 2023-02-16 LAB — IMMATURE PLATELET FRACTION: Immature Platelet Fraction: 15.6 % — ABNORMAL HIGH (ref 1.2–8.6)

## 2023-02-16 MED ORDER — ELTROMBOPAG OLAMINE 25 MG PO TABS
25.0000 mg | ORAL_TABLET | Freq: Every day | ORAL | 2 refills | Status: DC
  Filled 2023-02-19 (×2): qty 30, 30d supply, fill #0
  Filled 2023-03-13: qty 30, 30d supply, fill #1

## 2023-02-16 MED ORDER — ROMIPLOSTIM INJECTION 500 MCG
2.0000 ug/kg | SUBCUTANEOUS | Status: DC
  Administered 2023-02-16: 270 ug via SUBCUTANEOUS
  Filled 2023-02-16: qty 0.54

## 2023-02-16 MED ORDER — SILVER SULFADIAZINE 1 % EX CREA
TOPICAL_CREAM | Freq: Every day | CUTANEOUS | 2 refills | Status: DC
  Filled 2023-02-16 – 2023-02-19 (×2): qty 50, 30d supply, fill #0

## 2023-02-16 NOTE — Telephone Encounter (Signed)
Oral Oncology Patient Advocate Encounter  After completing a benefits investigation, prior authorization for Promacta is not required at this time through [facility].  Patient's copay is $0.     Oral Oncology Patient Advocate Encounter  After completing a benefits investigation, prior authorization for Promacta is not required at this time through [employer].  Patient's copay is $43.     Jinger Neighbors, CPhT-Adv Oncology Pharmacy Patient Advocate [facility] Direct Number: [phone]  Fax: [phone]

## 2023-02-16 NOTE — Progress Notes (Signed)
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 male who has been referred to [place] by Dr Bradley Ferris MD for evaluation and management of thrombocytopenia.   Patient has a history of hypertension, dyslipidemia, coronary disease status post MI with 2 stents placed more than 5 years ago, remote history of gastric ulcer.   Patient presented to the emergency room with right lower extremity pain and warmth redness and increasing discomfort.  He notes that he had had 2 traumas to the same area on his right leg after which he started having leg pain and swelling.  He was started on doxycycline as outpatient and noted that this failed to resolve his leg pain which was worsening and therefore he came to the emergency room.   In the emergency room the patient was noted to have severe right lower extremity cellulitis and was started on IV ceftriaxone and vancomycin. He was also noted on labs to have severe thrombocytopenia with platelets less than 5k  INTERVAL HISTORY:   Joel Hoffman is a 62 y.o. 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   Patient was last seen by me on 02/02/2023 and reported mild leg swelling and mild scratches in his legs from his cat, but there were no concerning injuries.   Patient occasionally bumps into objects at work causing wounds, but this has improved. He denies any bleeding issues or other new concerns. He reports that he alternately wraps his bilateral lower extremities for protection.   MEDICAL HISTORY:  Past Medical History:  Diagnosis Date   Coronary artery disease    Gastric ulcer    Heart attack (HCC)     Hypercholesteremia    Hypertension     SURGICAL HISTORY: Past Surgical History:  Procedure Laterality Date   CORONARY ANGIOPLASTY WITH STENT PLACEMENT     TONSILLECTOMY      SOCIAL HISTORY: Social History   Socioeconomic History   Marital status: Single    Spouse name: Not on file   Number of children: Not on file   Years of education: Not on file   Highest education level: Not on file  Occupational History   Occupation: Ecologist: Production assistant, radio FOR SELF EMPLOYED    Comment: Retired from [employer]  Tobacco Use   Smoking status: Never   Smokeless tobacco: Never  Vaping Use   Vaping status: Never Used  Substance and Sexual Activity   Alcohol use: No    Comment: "Quit 24 years ago"   Drug use: No   Sexual activity: Not Currently  Other Topics Concern   Not on file  Social History Narrative   Not on file   Social Determinants of Health   Financial Resource Strain: Not on file  Food Insecurity: No Food Insecurity (08/21/2022)   Hunger Vital Sign    Worried About Running Out of Food in the Last Year: Never true    Ran Out of Food in the Last Year: Never true  Transportation Needs: No Transportation Needs (08/21/2022)   PRAPARE - Administrator, Civil Service (Medical): No    Lack of Transportation (Non-Medical): No  Physical Activity: Not on file  Stress: Not on file  Social Connections: Not on file  Intimate Partner Violence: Not At Risk (08/21/2022)   Humiliation, Afraid, Rape, and Kick questionnaire    Fear of Current or Ex-Partner: No    Emotionally Abused: No    Physically Abused: No    Sexually Abused: No    FAMILY HISTORY: Family History  Problem Relation Age of Onset   Dementia Mother    Cancer Father    Diabetes Father    Emphysema Father    Colon cancer Father    Hypertension Brother     ALLERGIES:  is allergic to ampicillin.  MEDICATIONS:  Current Outpatient Medications  Medication Sig Dispense Refill    acetaminophen (TYLENOL) 500 MG tablet Take 500 mg by mouth 2 (two) times daily as needed for mild pain or headache.     atorvastatin (LIPITOR) 80 MG tablet Take 80 mg by mouth every evening.     carvedilol (COREG) 12.5 MG tablet Take 12.5 mg by mouth 2 (two) times daily.     doxycycline (VIBRA-TABS) 100 MG tablet Take 1 tablet (100 mg total) by mouth 2 (two) times daily. 20 tablet 0   Emollient (CETAPHIL) cream Apply 1 Application topically daily as needed (for dryness- affected areas).     lisinopril (ZESTRIL) 10 MG tablet Take 10 mg by mouth daily.     Multiple Vitamins-Minerals (CENTRUM SILVER 50+MEN PO) Take 1 tablet by mouth daily with breakfast.     ondansetron (ZOFRAN) 8 MG tablet Take 1 tablet (8 mg total) by mouth every 8 (eight) hours as needed for nausea or vomiting. 20 tablet 0   silver sulfADIAZINE (SILVADENE) 1 % cream Apply topically daily. 50 g 0   No current facility-administered medications for this visit.    REVIEW OF SYSTEMS:    10 Point review of Systems was done is negative except as noted above.   PHYSICAL EXAMINATION:  .There were no vitals taken for this visit.  GENERAL:alert, in no acute distress and comfortable SKIN: no acute rashes, no significant lesions EYES: conjunctiva are pink and non-injected, sclera anicteric OROPHARYNX: MMM, no exudates, no oropharyngeal erythema or ulceration NECK: supple, no JVD LYMPH:  no palpable lymphadenopathy in the cervical, axillary or inguinal regions LUNGS: clear to auscultation b/l with normal respiratory effort HEART: regular rate & rhythm ABDOMEN:  normoactive bowel sounds , non tender, not distended. Extremity: no pedal edema PSYCH: alert & oriented x 3 with fluent speech NEURO: no focal motor/sensory deficits   LABORATORY DATA:  I have reviewed the data as listed .    Latest Ref Rng & Units 02/02/2023    7:56 AM 01/26/2023    1:36 PM 01/19/2023    3:54 PM  CBC  WBC 4.0 - 10.5 K/uL 6.0  6.9  8.6   Hemoglobin  13.0 - 17.0 g/dL 16.1  09.6  04.5   Hematocrit 39.0 - 52.0 % 42.7  43.6  44.6   Platelets 150 - 400 K/uL 188  161  173    .    Latest Ref Rng & Units 02/02/2023    7:56 AM 01/26/2023    1:36 PM 01/19/2023    3:54 PM  CMP  Glucose 70 - 99 mg/dL 87  95  98   BUN 8 - 23 mg/dL 17  18  21    Creatinine 0.61 - 1.24 mg/dL 4.09  8.11  9.14   Sodium 135 - 145 mmol/L 142  142  143   Potassium 3.5 - 5.1  mmol/L 4.1  4.5  4.6   Chloride 98 - 111 mmol/L 110  108  108   CO2 22 - 32 mmol/L 27  29  28    Calcium 8.9 - 10.3 mg/dL 9.3  9.3  9.4   Total Protein 6.5 - 8.1 g/dL 6.2  6.5  6.9   Total Bilirubin 0.3 - 1.2 mg/dL 0.6  0.5  0.5   Alkaline Phos 38 - 126 U/L 68  69  73   AST 15 - 41 U/L 26  34  26   ALT 0 - 44 U/L 19  24  19     Immature platelet fraction 6.9$   RADIOGRAPHIC STUDIES: I have personally reviewed the radiological images as listed and agreed with the findings in the report. No results found.  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 Immature platelet fraction of 37.2% suggesting excellent bone marrow response. Use of high doses of ibuprofen over the last 5 days could also be a concern. Normal TSH B12 and folate levels Monospot test negative All of the patients questions were answered with apparent satisfaction. The patient knows to call the clinic with any problems, questions or concerns. CT abdomen pelvis done today shows no hepatosplenomegaly or signs of chronic liver disease.   #2 right lower extremity cellulitis following injury to the right leg with skin breach.-On ceftriaxone and vancomycin per hospitalist.  #3 hypertension #4 coronary disease status post remote history of PCI x 2 #5 obesity  PLAN:  -Discussed lab results on 02/16/23 in detail with patient. CBC  showed WBC of 7.0K, hemoglobin of 14.1, and platelets of 39K. -platelets have dropped from 188K two weeks ago to 39K currently -will switch to low-dose Promacta with goal to keep platelets between 75-100K.  -proceed with 54mcg/kg of Nplate today and continue weekly until Promacta is available -Educated patient on details of Promacta. Discussed that Promacta is generally well tolerated for long periods of time. Discussed potential risks including increased risk of blood clots if platelets are too high, though we will start at a low dose. Other risks may include liver inflammation in rare cases, or scarring in the bone marrow after about 5-10 years.  -discussed other option of a splenectomy, which would generally not be recommended in this case -will refill Sulfadiazine cream for wounds -answered all of patient's questions in detail  FOLLOW-UP: Plz schedule weekly Nplate with labs x 4 (will discontinue once Promacta is available)  RTC with Dr Candise Che with labs in 4 weeks  The total time spent in the appointment was *** minutes* .  All of the patient's questions were answered with apparent satisfaction. The patient knows to call the clinic with any problems, questions or concerns.   Wyvonnia Lora MD MS AAHIVMS [facility] Hematology/Oncology Physician [facility]  .*Total Encounter Time as defined by the Centers for Medicare and Medicaid Services includes, in addition to the face-to-face time of a patient visit (documented in the note above) non-face-to-face time: obtaining and reviewing outside history, ordering and reviewing medications, tests or procedures, care coordination (communications with other health care professionals or caregivers) and documentation in the medical record.    I,Mitra Faeizi,acting as a Neurosurgeon for Wyvonnia Lora, MD.,have documented all relevant documentation on the behalf of Wyvonnia Lora, MD,as directed by  Rance Muir  Candise Che, MD while in the presence of Wyvonnia Lora,  MD.   ***

## 2023-02-16 NOTE — Telephone Encounter (Signed)
Oral Oncology Pharmacist Encounter  Received new prescription for Promacta (eltrombopag) for the treatment of ITP, goal to maintain platelet count 75K/uL - 100K/uL.   CBC w/ Diff and CMP from 02/16/23 assessed, patient with platelet count of 39 K/uL. Prescription dose and frequency assessed for appropriateness.  Current medication list in Epic reviewed, DDIs with Promacta identified: Category C drug-drug interaction between Promacta and Atorvastatin - Promacta may increase serum concentrations of atorvastatin leading to increased risk of side effects. Monitor patient's LFTs, myalgias, etc. while on both agents. No changes in therapy warranted at this time.  Category D drug-drug interaction between Promacta and Multivitamin - Promacta will need to be administered 2 hours before or 4 hours after multivitamin. Will counsel patient about spacing out medications.   Evaluated chart and no patient barriers to medication adherence noted.   Prescription has been e-scribed to the [facility] for benefits analysis and approval.  Oral Oncology Clinic will continue to follow for insurance authorization, copayment issues, initial counseling and start date.  Lenord Carbo, PharmD, BCPS, [facility] Hematology/Oncology Clinical Pharmacist [facility] and [facility] Oral Chemotherapy Navigation Clinics [phone] 02/16/2023 2:53 PM

## 2023-02-19 ENCOUNTER — Other Ambulatory Visit (HOSPITAL_COMMUNITY): Payer: Self-pay

## 2023-02-19 ENCOUNTER — Other Ambulatory Visit: Payer: Self-pay

## 2023-02-19 ENCOUNTER — Other Ambulatory Visit: Payer: Self-pay | Admitting: Pharmacy Technician

## 2023-02-19 NOTE — Progress Notes (Signed)
Specialty Pharmacy Initial Fill Coordination Note  Joel Hoffman is a 62 y.o. male contacted today regarding refills of specialty medication(s) Eltrombopag [name] .  Patient requested Delivery  on 02/20/23  to verified address [address]  [place] [address]   Medication will be filled on 02/19/23.   Patient is aware of $0 copayment.

## 2023-02-19 NOTE — Telephone Encounter (Signed)
Oral Chemotherapy Pharmacist Encounter  I spoke with patient for overview of: Promacta (eltrombopag) for the treatment of ITP, goal to maintain platelet count 75K/uL - 100K/uL.   Counseled patient on administration, dosing, side effects, monitoring, drug-food interactions, safe handling, storage, and disposal.  Patient will take Promacta 25mg  tablets, 1 tablet by mouth once daily on an empty stomach, 1 hour before or 2 hours after a meal.  Patient counseled to administer Promacta at least 2 hours before, or 4 hours after antacids, foods high in calcium, or vitamin supplements (iron, calcium, aluminum, magnesium, selinium, zinc). Patient states he takes his multivitamin in the AM and will dose Promacta in the evening, 2 hours after he has finished dinner.   Promacta start date: 02/20/23 PM  Adverse effects include but are not limited to: fatigue, GI upset, hepatotoxicity, myalgias.    Reviewed with patient importance of keeping a medication schedule and plan for any missed doses. No barriers to medication adherence identified.  Medication reconciliation performed and medication/allergy list updated. Patient still takes atorvastatin - patient educated on monitoring for muscle pains, and/or changes in urine color to [employer] color and letting the office know.   All questions answered.  Joel Hoffman voiced understanding and appreciation.   Medication education handout placed in mail for patient. Patient knows to call the office with questions or concerns. Oral Chemotherapy Clinic phone number provided to patient.   Joel Hoffman, PharmD, BCPS, BCOP Hematology/Oncology Clinical Pharmacist [facility] and [facility] Oral Chemotherapy Navigation Clinics [phone] 02/19/2023 10:07 AM

## 2023-02-19 NOTE — Progress Notes (Signed)
Oral Chemotherapy Pharmacist Encounter  Patient was counseled under telephone encounter from 02/16/23. Patient counseled via phone on 02/19/23.  Lenord Carbo, PharmD, BCPS, BCOP Hematology/Oncology Clinical Pharmacist [facility] and [facility] Oral Chemotherapy Navigation Clinics [phone] 02/19/2023 10:10 AM

## 2023-02-20 ENCOUNTER — Other Ambulatory Visit (HOSPITAL_COMMUNITY): Payer: Self-pay

## 2023-02-20 ENCOUNTER — Other Ambulatory Visit: Payer: Self-pay

## 2023-02-21 ENCOUNTER — Other Ambulatory Visit: Payer: Self-pay

## 2023-02-21 DIAGNOSIS — D693 Immune thrombocytopenic purpura: Secondary | ICD-10-CM

## 2023-02-22 ENCOUNTER — Encounter: Payer: Self-pay | Admitting: Hematology and Oncology

## 2023-02-22 ENCOUNTER — Encounter: Payer: Self-pay | Admitting: Hematology

## 2023-02-23 ENCOUNTER — Inpatient Hospital Stay

## 2023-02-23 ENCOUNTER — Other Ambulatory Visit: Payer: Self-pay

## 2023-02-23 VITALS — BP 168/89 | HR 71 | Temp 97.9°F | Resp 18

## 2023-02-23 DIAGNOSIS — D693 Immune thrombocytopenic purpura: Secondary | ICD-10-CM

## 2023-02-23 LAB — CBC WITH DIFFERENTIAL (CANCER CENTER ONLY)
Abs Immature Granulocytes: 0.04 10*3/uL (ref 0.00–0.07)
Basophils Absolute: 0.1 10*3/uL (ref 0.0–0.1)
Basophils Relative: 1 %
Eosinophils Absolute: 0.4 10*3/uL (ref 0.0–0.5)
Eosinophils Relative: 6 %
HCT: 44.6 % (ref 39.0–52.0)
Hemoglobin: 14.6 g/dL (ref 13.0–17.0)
Immature Granulocytes: 1 %
Lymphocytes Relative: 20 %
Lymphs Abs: 1.4 10*3/uL (ref 0.7–4.0)
MCH: 29.7 pg (ref 26.0–34.0)
MCHC: 32.7 g/dL (ref 30.0–36.0)
MCV: 90.7 fL (ref 80.0–100.0)
Monocytes Absolute: 0.7 10*3/uL (ref 0.1–1.0)
Monocytes Relative: 9 %
Neutro Abs: 4.6 10*3/uL (ref 1.7–7.7)
Neutrophils Relative %: 63 %
Platelet Count: 82 10*3/uL — ABNORMAL LOW (ref 150–400)
RBC: 4.92 MIL/uL (ref 4.22–5.81)
RDW: 13.9 % (ref 11.5–15.5)
WBC Count: 7.2 10*3/uL (ref 4.0–10.5)
nRBC: 0 % (ref 0.0–0.2)

## 2023-02-23 LAB — CMP (CANCER CENTER ONLY)
ALT: 23 U/L (ref 0–44)
AST: 29 U/L (ref 15–41)
Albumin: 4.2 g/dL (ref 3.5–5.0)
Alkaline Phosphatase: 83 U/L (ref 38–126)
Anion gap: 2 — ABNORMAL LOW (ref 5–15)
BUN: 16 mg/dL (ref 8–23)
CO2: 33 mmol/L — ABNORMAL HIGH (ref 22–32)
Calcium: 9.7 mg/dL (ref 8.9–10.3)
Chloride: 108 mmol/L (ref 98–111)
Creatinine: 0.87 mg/dL (ref 0.61–1.24)
GFR, Estimated: >60 mL/min (ref >60)
Glucose, Bld: 86 mg/dL (ref 70–99)
Potassium: 5.2 mmol/L — ABNORMAL HIGH (ref 3.5–5.1)
Sodium: 143 mmol/L (ref 135–145)
Total Bilirubin: 0.7 mg/dL (ref 0.3–1.2)
Total Protein: 6.5 g/dL (ref 6.5–8.1)

## 2023-02-23 LAB — IMMATURE PLATELET FRACTION: Immature Platelet Fraction: 10.9 % — ABNORMAL HIGH (ref 1.2–8.6)

## 2023-02-23 MED ORDER — ROMIPLOSTIM INJECTION 500 MCG
2.0000 ug/kg | SUBCUTANEOUS | Status: DC
  Administered 2023-02-23: 270 ug via SUBCUTANEOUS
  Filled 2023-02-23: qty 0.5

## 2023-02-28 ENCOUNTER — Other Ambulatory Visit: Payer: Self-pay

## 2023-02-28 DIAGNOSIS — D693 Immune thrombocytopenic purpura: Secondary | ICD-10-CM

## 2023-03-02 ENCOUNTER — Inpatient Hospital Stay

## 2023-03-02 ENCOUNTER — Inpatient Hospital Stay: Admitting: Hematology

## 2023-03-02 NOTE — Progress Notes (Incomplete)
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 male who has been referred to [place] by Dr Bradley Ferris MD for evaluation and management of thrombocytopenia.   Patient has a history of hypertension, dyslipidemia, coronary disease status post MI with 2 stents placed more than 5 years ago, remote history of gastric ulcer.   Patient presented to the emergency room with right lower extremity pain and warmth redness and increasing discomfort.  He notes that he had had 2 traumas to the same area on his right leg after which he started having leg pain and swelling.  He was started on doxycycline as outpatient and noted that this failed to resolve his leg pain which was worsening and therefore he came to the emergency room.   In the emergency room the patient was noted to have severe right lower extremity cellulitis and was started on IV ceftriaxone and vancomycin. He was also noted on labs to have severe thrombocytopenia with platelets less than 5k  INTERVAL HISTORY:   Joel Hoffman is a 62 y.o. 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   Patient was last seen by me on 02/16/2023 and reported occasional wounds in his extremities from bumping into objects at work.   Today,  -Discussed lab results on 03/02/23 in detail with patient. CBC showed WBC of ***K, hemoglobin of ***, and platelets of ***K. -   MEDICAL HISTORY:  Past Medical History:  Diagnosis Date   Coronary artery disease    Gastric ulcer    Heart attack (HCC)    Hypercholesteremia    Hypertension     SURGICAL HISTORY: Past Surgical History:  Procedure Laterality  Date   CORONARY ANGIOPLASTY WITH STENT PLACEMENT     TONSILLECTOMY      SOCIAL HISTORY: Social History   Socioeconomic History   Marital status: Single    Spouse name: Not on file   Number of children: Not on file   Years of education: Not on file   Highest education level: Not on file  Occupational History   Occupation: Ecologist: Production assistant, radio FOR SELF EMPLOYED    Comment: Retired from [employer]  Tobacco Use   Smoking status: Never   Smokeless tobacco: Never  Vaping Use   Vaping status: Never Used  Substance and Sexual Activity   Alcohol use: No    Comment: "Quit 24 years ago"   Drug use: No   Sexual activity: Not Currently  Other Topics Concern   Not on file  Social History Narrative   Not on file   Social Determinants of Health   Financial Resource Strain: Not on file  Food Insecurity: No Food Insecurity (08/21/2022)   Hunger Vital Sign    Worried About Running Out of Food in the Last Year: Never true    Ran Out of Food in the Last Year: Never true  Transportation Needs: No Transportation Needs (08/21/2022)   PRAPARE - Administrator, Civil Service (Medical): No    Lack of Transportation (Non-Medical): No  Physical Activity: Not on file  Stress: Not on file  Social Connections: Not on file  Intimate Partner Violence: Not At Risk (  08/21/2022)   Humiliation, Afraid, Rape, and Kick questionnaire    Fear of Current or Ex-Partner: No    Emotionally Abused: No    Physically Abused: No    Sexually Abused: No    FAMILY HISTORY: Family History  Problem Relation Age of Onset   Dementia Mother    Cancer Father    Diabetes Father    Emphysema Father    Colon cancer Father    Hypertension Brother     ALLERGIES:  is allergic to ampicillin.  MEDICATIONS:  Current Outpatient Medications  Medication Sig Dispense Refill   acetaminophen (TYLENOL) 500 MG tablet Take 500 mg by mouth 2 (two) times daily as needed for mild pain or headache.      atorvastatin (LIPITOR) 80 MG tablet Take 80 mg by mouth every evening.     carvedilol (COREG) 12.5 MG tablet Take 12.5 mg by mouth 2 (two) times daily.     doxycycline (VIBRA-TABS) 100 MG tablet Take 1 tablet (100 mg total) by mouth 2 (two) times daily. 20 tablet 0   eltrombopag (PROMACTA) 25 MG tablet Take 1 tablet (25 mg total) by mouth daily. Take on an empty stomach, 1 hour before a meal or 2 hours after. 30 tablet 2   Emollient (CETAPHIL) cream Apply 1 Application topically daily as needed (for dryness- affected areas).     lisinopril (ZESTRIL) 10 MG tablet Take 10 mg by mouth daily.     Multiple Vitamins-Minerals (CENTRUM SILVER 50+MEN PO) Take 1 tablet by mouth daily with breakfast.     ondansetron (ZOFRAN) 8 MG tablet Take 1 tablet (8 mg total) by mouth every 8 (eight) hours as needed for nausea or vomiting. 20 tablet 0   silver sulfADIAZINE (SILVADENE) 1 % cream Apply topically daily. 50 g 2   No current facility-administered medications for this visit.    REVIEW OF SYSTEMS:    10 Point review of Systems was done is negative except as noted above.   PHYSICAL EXAMINATION:  .There were no vitals taken for this visit.  GENERAL:alert, in no acute distress and comfortable SKIN: no acute rashes, no significant lesions EYES: conjunctiva are pink and non-injected, sclera anicteric OROPHARYNX: MMM, no exudates, no oropharyngeal erythema or ulceration NECK: supple, no JVD LYMPH:  no palpable lymphadenopathy in the cervical, axillary or inguinal regions LUNGS: clear to auscultation b/l with normal respiratory effort HEART: regular rate & rhythm ABDOMEN:  normoactive bowel sounds , non tender, not distended. Extremity: no pedal edema PSYCH: alert & oriented x 3 with fluent speech NEURO: no focal motor/sensory deficits   LABORATORY DATA:  I have reviewed the data as listed .    Latest Ref Rng & Units 02/23/2023    8:12 AM 02/16/2023    1:47 PM 02/02/2023    7:56 AM  CBC  WBC 4.0  - 10.5 K/uL 7.2  7.0  6.0   Hemoglobin 13.0 - 17.0 g/dL 16.1  09.6  04.5   Hematocrit 39.0 - 52.0 % 44.6  42.0  42.7   Platelets 150 - 400 K/uL 82  39  188    .    Latest Ref Rng & Units 02/23/2023    8:12 AM 02/16/2023    1:47 PM 02/02/2023    7:56 AM  CMP  Glucose 70 - 99 mg/dL 86  88  87   BUN 8 - 23 mg/dL 16  15  17    Creatinine 0.61 - 1.24 mg/dL 4.09  8.11  9.14  Sodium 135 - 145 mmol/L 143  140  142   Potassium 3.5 - 5.1 mmol/L 5.2  5.0  4.1   Chloride 98 - 111 mmol/L 108  106  110   CO2 22 - 32 mmol/L 33  30  27   Calcium 8.9 - 10.3 mg/dL 9.7  9.6  9.3   Total Protein 6.5 - 8.1 g/dL 6.5  6.3  6.2   Total Bilirubin 0.3 - 1.2 mg/dL 0.7  0.7  0.6   Alkaline Phos 38 - 126 U/L 83  84  68   AST 15 - 41 U/L 29  33  26   ALT 0 - 44 U/L 23  21  19     Immature platelet fraction 6.9$   RADIOGRAPHIC STUDIES: I have personally reviewed the radiological images as listed and agreed with the findings in the report. No results found.  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 Immature platelet fraction of 37.2% suggesting excellent bone marrow response. Use of high doses of ibuprofen over the last 5 days could also be a concern. Normal TSH B12 and folate levels Monospot test negative All of the patients questions were answered with apparent satisfaction. The patient knows to call the clinic with any problems, questions or concerns. CT abdomen pelvis done today shows no hepatosplenomegaly or signs of chronic liver disease.   #2 right lower extremity cellulitis following injury to the right leg with skin breach.-On ceftriaxone and vancomycin per hospitalist.  #3 hypertension #4 coronary disease status post remote history of PCI x 2 #5 obesity  PLAN:  -Discussed lab results on  02/16/23 in detail with patient. CBC showed WBC of 7.0K, hemoglobin of 14.1, and platelets of 39K. -platelets have dropped from 188K two weeks ago to 39K currently -will switch to low-dose Promacta at 25mg  po daily with goal to keep platelets between 75-100K.  -proceed with 70mcg/kg of Nplate today and continue weekly until Promacta is available -Educated patient on details of Promacta. Discussed that Promacta is generally well tolerated for long periods of time. Discussed potential risks including increased risk of blood clots if platelets are too high, though we will start at a low dose. Other risks may include liver inflammation in rare cases, or scarring in the bone marrow after about 5-10 years.  -discussed other option of a splenectomy, which would generally not be recommended in this case -will refill Sulfadiazine cream for wounds -answered all of patient's questions in detail  FOLLOW-UP: ***  The total time spent in the appointment was *** minutes* .  All of the patient's questions were answered with apparent satisfaction. The patient knows to call the clinic with any problems, questions or concerns.   Wyvonnia Lora MD MS AAHIVMS [facility] Psychiatric Hematology/Oncology Physician [facility]  .*Total Encounter Time as defined by the Centers for Medicare and Medicaid Services includes, in addition to the face-to-face time of a patient visit (documented in the note above) non-face-to-face time: obtaining and reviewing outside history, ordering and reviewing medications, tests or procedures, care coordination (communications with other health care professionals or caregivers) and documentation in the medical record.    I,Mitra Faeizi,acting as a Neurosurgeon for Wyvonnia Lora, MD.,have documented all relevant documentation on the behalf of Wyvonnia Lora, MD,as directed by  Wyvonnia Lora, MD while  in the presence of Wyvonnia Lora, MD.  ***

## 2023-03-05 ENCOUNTER — Inpatient Hospital Stay (HOSPITAL_BASED_OUTPATIENT_CLINIC_OR_DEPARTMENT_OTHER): Admitting: Hematology

## 2023-03-05 ENCOUNTER — Inpatient Hospital Stay: Attending: Hematology

## 2023-03-05 ENCOUNTER — Inpatient Hospital Stay

## 2023-03-05 VITALS — BP 156/84 | HR 72 | Temp 98.0°F | Resp 20 | Wt 300.2 lb

## 2023-03-05 DIAGNOSIS — L03115 Cellulitis of right lower limb: Secondary | ICD-10-CM | POA: Insufficient documentation

## 2023-03-05 DIAGNOSIS — I2510 Atherosclerotic heart disease of native coronary artery without angina pectoris: Secondary | ICD-10-CM | POA: Insufficient documentation

## 2023-03-05 DIAGNOSIS — D693 Immune thrombocytopenic purpura: Secondary | ICD-10-CM

## 2023-03-05 DIAGNOSIS — Z825 Family history of asthma and other chronic lower respiratory diseases: Secondary | ICD-10-CM | POA: Diagnosis not present

## 2023-03-05 DIAGNOSIS — Z6841 Body Mass Index (BMI) 40.0 and over, adult: Secondary | ICD-10-CM | POA: Insufficient documentation

## 2023-03-05 DIAGNOSIS — Z800 Family history of malignant neoplasm of digestive organs: Secondary | ICD-10-CM | POA: Insufficient documentation

## 2023-03-05 DIAGNOSIS — Z833 Family history of diabetes mellitus: Secondary | ICD-10-CM | POA: Insufficient documentation

## 2023-03-05 DIAGNOSIS — Z809 Family history of malignant neoplasm, unspecified: Secondary | ICD-10-CM | POA: Insufficient documentation

## 2023-03-05 DIAGNOSIS — Z8249 Family history of ischemic heart disease and other diseases of the circulatory system: Secondary | ICD-10-CM | POA: Diagnosis not present

## 2023-03-05 DIAGNOSIS — I10 Essential (primary) hypertension: Secondary | ICD-10-CM | POA: Insufficient documentation

## 2023-03-05 DIAGNOSIS — Z9089 Acquired absence of other organs: Secondary | ICD-10-CM | POA: Diagnosis not present

## 2023-03-05 DIAGNOSIS — Z880 Allergy status to penicillin: Secondary | ICD-10-CM | POA: Insufficient documentation

## 2023-03-05 DIAGNOSIS — I252 Old myocardial infarction: Secondary | ICD-10-CM | POA: Diagnosis not present

## 2023-03-05 DIAGNOSIS — E669 Obesity, unspecified: Secondary | ICD-10-CM | POA: Diagnosis not present

## 2023-03-05 DIAGNOSIS — Z79899 Other long term (current) drug therapy: Secondary | ICD-10-CM | POA: Insufficient documentation

## 2023-03-05 DIAGNOSIS — Z8711 Personal history of peptic ulcer disease: Secondary | ICD-10-CM | POA: Insufficient documentation

## 2023-03-05 LAB — CBC WITH DIFFERENTIAL (CANCER CENTER ONLY)
Abs Immature Granulocytes: 0.05 10*3/uL (ref 0.00–0.07)
Basophils Absolute: 0.1 10*3/uL (ref 0.0–0.1)
Basophils Relative: 1 %
Eosinophils Absolute: 0.6 10*3/uL — ABNORMAL HIGH (ref 0.0–0.5)
Eosinophils Relative: 7 %
HCT: 42.3 % (ref 39.0–52.0)
Hemoglobin: 14.2 g/dL (ref 13.0–17.0)
Immature Granulocytes: 1 %
Lymphocytes Relative: 20 %
Lymphs Abs: 1.6 10*3/uL (ref 0.7–4.0)
MCH: 30.5 pg (ref 26.0–34.0)
MCHC: 33.6 g/dL (ref 30.0–36.0)
MCV: 91 fL (ref 80.0–100.0)
Monocytes Absolute: 0.9 10*3/uL (ref 0.1–1.0)
Monocytes Relative: 11 %
Neutro Abs: 4.8 10*3/uL (ref 1.7–7.7)
Neutrophils Relative %: 60 %
Platelet Count: 384 10*3/uL (ref 150–400)
RBC: 4.65 MIL/uL (ref 4.22–5.81)
RDW: 14.1 % (ref 11.5–15.5)
WBC Count: 8.1 10*3/uL (ref 4.0–10.5)
nRBC: 0 % (ref 0.0–0.2)

## 2023-03-05 LAB — CMP (CANCER CENTER ONLY)
ALT: 22 U/L (ref 0–44)
AST: 27 U/L (ref 15–41)
Albumin: 4 g/dL (ref 3.5–5.0)
Alkaline Phosphatase: 75 U/L (ref 38–126)
Anion gap: 2 — ABNORMAL LOW (ref 5–15)
BUN: 20 mg/dL (ref 8–23)
CO2: 30 mmol/L (ref 22–32)
Calcium: 9 mg/dL (ref 8.9–10.3)
Chloride: 109 mmol/L (ref 98–111)
Creatinine: 0.92 mg/dL (ref 0.61–1.24)
GFR, Estimated: >60 mL/min (ref >60)
Glucose, Bld: 89 mg/dL (ref 70–99)
Potassium: 4.4 mmol/L (ref 3.5–5.1)
Sodium: 141 mmol/L (ref 135–145)
Total Bilirubin: 0.7 mg/dL (ref <1.2)
Total Protein: 6.2 g/dL — ABNORMAL LOW (ref 6.5–8.1)

## 2023-03-05 LAB — IMMATURE PLATELET FRACTION: Immature Platelet Fraction: 5.2 % (ref 1.2–8.6)

## 2023-03-05 NOTE — Progress Notes (Signed)
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 male who has been referred to [place] by Dr Bradley Ferris MD for evaluation and management of thrombocytopenia.   Patient has a history of hypertension, dyslipidemia, coronary disease status post MI with 2 stents placed more than 5 years ago, remote history of gastric ulcer.   Patient presented to the emergency room with right lower extremity pain and warmth redness and increasing discomfort.  He notes that he had had 2 traumas to the same area on his right leg after which he started having leg pain and swelling.  He was started on doxycycline as outpatient and noted that this failed to resolve his leg pain which was worsening and therefore he came to the emergency room.   In the emergency room the patient was noted to have severe right lower extremity cellulitis and was started on IV ceftriaxone and vancomycin. He was also noted on labs to have severe thrombocytopenia with platelets less than 5k  INTERVAL HISTORY:   Leron Losier is a 62 y.o. 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   Patient was last seen by me on 02/16/2023 and he was doing well overall.   Patient notes he has been doing well overall without any new or severe medical concerns since our last visit. He has started taking his Promacta 25 mg last Wednesday. His last N-plate shot was beginning of last week.   He denies any new infection issues, fever, chills, night sweats, unexpected weight loss, back pain, chest pain, abdominal pain, or leg swelling.    MEDICAL HISTORY:  Past Medical History:  Diagnosis Date    Coronary artery disease    Gastric ulcer    Heart attack (HCC)    Hypercholesteremia    Hypertension     SURGICAL HISTORY: Past Surgical History:  Procedure Laterality Date   CORONARY ANGIOPLASTY WITH STENT PLACEMENT     TONSILLECTOMY      SOCIAL HISTORY: Social History   Socioeconomic History   Marital status: Single    Spouse name: Not on file   Number of children: Not on file   Years of education: Not on file   Highest education level: Not on file  Occupational History   Occupation: Ecologist: Production assistant, radio FOR SELF EMPLOYED    Comment: Retired from [employer]  Tobacco Use   Smoking status: Never   Smokeless tobacco: Never  Vaping Use   Vaping status: Never Used  Substance and Sexual Activity   Alcohol use: No    Comment: "Quit 24 years ago"   Drug use: No   Sexual activity: Not Currently  Other Topics Concern   Not on file  Social History Narrative   Not on file   Social Determinants of Health   Financial Resource Strain: Not on file  Food Insecurity: No Food Insecurity (08/21/2022)   Hunger Vital Sign    Worried About Running Out of Food in the Last Year: Never true    Ran Out of Food in the Last Year: Never true  Transportation Needs: No Transportation Needs (08/21/2022)   PRAPARE - Administrator, Civil Service (Medical): No  Lack of Transportation (Non-Medical): No  Physical Activity: Not on file  Stress: Not on file  Social Connections: Not on file  Intimate Partner Violence: Not At Risk (08/21/2022)   Humiliation, Afraid, Rape, and Kick questionnaire    Fear of Current or Ex-Partner: No    Emotionally Abused: No    Physically Abused: No    Sexually Abused: No    FAMILY HISTORY: Family History  Problem Relation Age of Onset   Dementia Mother    Cancer Father    Diabetes Father    Emphysema Father    Colon cancer Father    Hypertension Brother     ALLERGIES:  is allergic to ampicillin.  MEDICATIONS:  Current  Outpatient Medications  Medication Sig Dispense Refill   acetaminophen (TYLENOL) 500 MG tablet Take 500 mg by mouth 2 (two) times daily as needed for mild pain or headache.     atorvastatin (LIPITOR) 80 MG tablet Take 80 mg by mouth every evening.     carvedilol (COREG) 12.5 MG tablet Take 12.5 mg by mouth 2 (two) times daily.     doxycycline (VIBRA-TABS) 100 MG tablet Take 1 tablet (100 mg total) by mouth 2 (two) times daily. 20 tablet 0   eltrombopag (PROMACTA) 25 MG tablet Take 1 tablet (25 mg total) by mouth daily. Take on an empty stomach, 1 hour before a meal or 2 hours after. 30 tablet 2   Emollient (CETAPHIL) cream Apply 1 Application topically daily as needed (for dryness- affected areas).     lisinopril (ZESTRIL) 10 MG tablet Take 10 mg by mouth daily.     Multiple Vitamins-Minerals (CENTRUM SILVER 50+MEN PO) Take 1 tablet by mouth daily with breakfast.     ondansetron (ZOFRAN) 8 MG tablet Take 1 tablet (8 mg total) by mouth every 8 (eight) hours as needed for nausea or vomiting. 20 tablet 0   silver sulfADIAZINE (SILVADENE) 1 % cream Apply topically daily. 50 g 2   No current facility-administered medications for this visit.    REVIEW OF SYSTEMS:    10 Point review of Systems was done is negative except as noted above.   PHYSICAL EXAMINATION:  .BP (!) 156/84   Pulse 72   Temp 98 F (36.7 C)   Resp 20   Wt (!) 300 lb 3.2 oz (136.2 kg)   SpO2 99%   BMI 40.71 kg/m   GENERAL:alert, in no acute distress and comfortable SKIN: no acute rashes, no significant lesions LUNGS: clear to auscultation b/l with normal respiratory effort HEART: regular rate & rhythm ABDOMEN:  normoactive bowel sounds , non tender, not distended. Extremity: no pedal edema PSYCH: alert & oriented x 3 with fluent speech NEURO: no focal motor/sensory deficits   LABORATORY DATA:  I have reviewed the data as listed .    Latest Ref Rng & Units 03/05/2023   11:36 AM 02/23/2023    8:12 AM 02/16/2023     1:47 PM  CBC  WBC 4.0 - 10.5 K/uL 8.1  7.2  7.0   Hemoglobin 13.0 - 17.0 g/dL 08.6  57.8  46.9   Hematocrit 39.0 - 52.0 % 42.3  44.6  42.0   Platelets 150 - 400 K/uL 384  82  39    .    Latest Ref Rng & Units 03/05/2023   11:36 AM 02/23/2023    8:12 AM 02/16/2023    1:47 PM  CMP  Glucose 70 - 99 mg/dL 89  86  88  BUN 8 - 23 mg/dL 20  16  15    Creatinine 0.61 - 1.24 mg/dL 1.61  0.96  0.45   Sodium 135 - 145 mmol/L 141  143  140   Potassium 3.5 - 5.1 mmol/L 4.4  5.2  5.0   Chloride 98 - 111 mmol/L 109  108  106   CO2 22 - 32 mmol/L 30  33  30   Calcium 8.9 - 10.3 mg/dL 9.0  9.7  9.6   Total Protein 6.5 - 8.1 g/dL 6.2  6.5  6.3   Total Bilirubin <1.2 mg/dL 0.7  0.7  0.7   Alkaline Phos 38 - 126 U/L 75  83  84   AST 15 - 41 U/L 27  29  33   ALT 0 - 44 U/L 22  23  21     Immature platelet fraction 6.9$   RADIOGRAPHIC STUDIES: I have personally reviewed the radiological images as listed and agreed with the findings in the report. No results found.  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 Immature platelet fraction of 37.2% suggesting excellent bone marrow response. Use of high doses of ibuprofen over the last 5 days could also be a concern. Normal TSH B12 and folate levels Monospot test negative All of the patients questions were answered with apparent satisfaction. The patient knows to call the clinic with any problems, questions or concerns. CT abdomen pelvis done today shows no hepatosplenomegaly or signs of chronic liver disease.   #2 right lower extremity cellulitis following injury to the right leg with skin breach.-On ceftriaxone and vancomycin per hospitalist.  #3 hypertension #4 coronary disease status post remote history of PCI x 2 #5  obesity  PLAN: -Discussed lab results from today, 03/05/2023, in detail with the patient. CBC is stable. CMP is stable. -Hold N-Plate shots from today.  -Continue Promacta 25 mg as prescribed.  -Patient started Promacta last Wednesday and has been tolerating it well without any new or severe toxicities.  -Discussed with the patient that we will check lab in 3 weeks and then make changes to his Promacta dosage.  -discussed other option of a splenectomy, which would generally not be recommended in this case -will refill Sulfadiazine cream for wounds -answered all of patient's questions in detail  FOLLOW-UP: Phone visit with Dr Candise Che in about 3 weeks Labs 1 day prior to phone visit  The total time spent in the appointment was 20 minutes* .  All of the patient's questions were answered with apparent satisfaction. The patient knows to call the clinic with any problems, questions or concerns.   Wyvonnia Lora MD MS AAHIVMS [facility] Hematology/Oncology Physician [facility]  .*Total Encounter Time as defined by the Centers for Medicare and Medicaid Services includes, in addition to the face-to-face time of a patient visit (documented in the note above) non-face-to-face time: obtaining and reviewing outside history, ordering and reviewing medications, tests or procedures, care coordination (communications with other health care professionals or caregivers) and documentation in the medical record.   I,Param Shah,acting as a Neurosurgeon for Wyvonnia Lora, MD.,have documented all relevant documentation on the behalf of Wyvonnia Lora, MD,as directed by  Wyvonnia Lora, MD while in the presence of Wyvonnia Lora, MD.  .I have reviewed the above documentation for accuracy and completeness,  and I agree with the above. [name] MD

## 2023-03-06 ENCOUNTER — Other Ambulatory Visit (HOSPITAL_COMMUNITY): Payer: Self-pay

## 2023-03-07 ENCOUNTER — Telehealth: Payer: Self-pay | Admitting: Hematology

## 2023-03-07 NOTE — Telephone Encounter (Signed)
Per Dr. Candise Che 03/05/2023 scheduling orders patient is aware of scheduled appointment times/dates

## 2023-03-08 ENCOUNTER — Other Ambulatory Visit: Payer: Self-pay

## 2023-03-09 ENCOUNTER — Other Ambulatory Visit

## 2023-03-09 ENCOUNTER — Ambulatory Visit

## 2023-03-09 ENCOUNTER — Other Ambulatory Visit: Payer: Self-pay

## 2023-03-11 ENCOUNTER — Encounter: Payer: Self-pay | Admitting: Hematology

## 2023-03-13 ENCOUNTER — Other Ambulatory Visit (HOSPITAL_COMMUNITY): Payer: Self-pay

## 2023-03-13 ENCOUNTER — Other Ambulatory Visit: Payer: Self-pay

## 2023-03-13 NOTE — Progress Notes (Signed)
Specialty Pharmacy Refill Coordination Note  Joel Hoffman is a 62 y.o. male contacted today regarding refills of specialty medication(s) Eltrombopag [name]   Patient requested [name] at [facility] at [place] date: 03/19/23   Medication will be filled on 03/16/23.

## 2023-03-13 NOTE — Progress Notes (Signed)
Specialty Pharmacy Ongoing Clinical Assessment Note  Joel Hoffman is a 62 y.o. male who is being followed by the specialty pharmacy service for RxSp Oncology   Patient's specialty medication(s) reviewed today: Eltrombopag [name]   Missed doses in the last 4 weeks: 0   Patient/Caregiver did not have any additional questions or concerns.   No data recorded  Adverse events/side effects summary: No adverse events/side effects   Patient's therapy is appropriate to: Continue    Goals Addressed             This Visit's Progress    Improve or maintain quality of life   On track    Patient is initiating therapy. Patient will maintain adherence         Follow up:  6 months  Servando Snare Specialty Pharmacist

## 2023-03-16 ENCOUNTER — Other Ambulatory Visit (HOSPITAL_COMMUNITY): Payer: Self-pay

## 2023-03-16 ENCOUNTER — Other Ambulatory Visit: Payer: Self-pay

## 2023-03-16 DIAGNOSIS — D693 Immune thrombocytopenic purpura: Secondary | ICD-10-CM

## 2023-03-19 ENCOUNTER — Other Ambulatory Visit (HOSPITAL_COMMUNITY): Payer: Self-pay

## 2023-03-19 ENCOUNTER — Inpatient Hospital Stay

## 2023-03-19 DIAGNOSIS — D693 Immune thrombocytopenic purpura: Secondary | ICD-10-CM

## 2023-03-19 LAB — CMP (CANCER CENTER ONLY)
ALT: 26 U/L (ref 0–44)
AST: 30 U/L (ref 15–41)
Albumin: 4.1 g/dL (ref 3.5–5.0)
Alkaline Phosphatase: 76 U/L (ref 38–126)
Anion gap: 5 (ref 5–15)
BUN: 16 mg/dL (ref 8–23)
CO2: 30 mmol/L (ref 22–32)
Calcium: 9.2 mg/dL (ref 8.9–10.3)
Chloride: 107 mmol/L (ref 98–111)
Creatinine: 0.8 mg/dL (ref 0.61–1.24)
GFR, Estimated: >60 mL/min (ref >60)
Glucose, Bld: 85 mg/dL (ref 70–99)
Potassium: 4 mmol/L (ref 3.5–5.1)
Sodium: 142 mmol/L (ref 135–145)
Total Bilirubin: 0.8 mg/dL (ref <1.2)
Total Protein: 6.3 g/dL — ABNORMAL LOW (ref 6.5–8.1)

## 2023-03-19 LAB — CBC WITH DIFFERENTIAL (CANCER CENTER ONLY)
Abs Immature Granulocytes: 0.03 10*3/uL (ref 0.00–0.07)
Basophils Absolute: 0.1 10*3/uL (ref 0.0–0.1)
Basophils Relative: 1 %
Eosinophils Absolute: 0.3 10*3/uL (ref 0.0–0.5)
Eosinophils Relative: 5 %
HCT: 44.4 % (ref 39.0–52.0)
Hemoglobin: 14.6 g/dL (ref 13.0–17.0)
Immature Granulocytes: 0 %
Lymphocytes Relative: 24 %
Lymphs Abs: 1.6 10*3/uL (ref 0.7–4.0)
MCH: 29.9 pg (ref 26.0–34.0)
MCHC: 32.9 g/dL (ref 30.0–36.0)
MCV: 91 fL (ref 80.0–100.0)
Monocytes Absolute: 0.6 10*3/uL (ref 0.1–1.0)
Monocytes Relative: 9 %
Neutro Abs: 4.1 10*3/uL (ref 1.7–7.7)
Neutrophils Relative %: 61 %
Platelet Count: 28 10*3/uL — ABNORMAL LOW (ref 150–400)
RBC: 4.88 MIL/uL (ref 4.22–5.81)
RDW: 13.8 % (ref 11.5–15.5)
WBC Count: 6.8 10*3/uL (ref 4.0–10.5)
nRBC: 0 % (ref 0.0–0.2)

## 2023-03-19 LAB — IMMATURE PLATELET FRACTION: Immature Platelet Fraction: 17.1 % — ABNORMAL HIGH (ref 1.2–8.6)

## 2023-03-21 ENCOUNTER — Other Ambulatory Visit (HOSPITAL_COMMUNITY): Payer: Self-pay

## 2023-03-27 ENCOUNTER — Inpatient Hospital Stay

## 2023-03-27 ENCOUNTER — Other Ambulatory Visit: Payer: Self-pay

## 2023-03-27 ENCOUNTER — Inpatient Hospital Stay (HOSPITAL_BASED_OUTPATIENT_CLINIC_OR_DEPARTMENT_OTHER): Admitting: Hematology

## 2023-03-27 DIAGNOSIS — D693 Immune thrombocytopenic purpura: Secondary | ICD-10-CM

## 2023-03-27 LAB — CBC WITH DIFFERENTIAL (CANCER CENTER ONLY)
Abs Immature Granulocytes: 0.02 10*3/uL (ref 0.00–0.07)
Basophils Absolute: 0.1 10*3/uL (ref 0.0–0.1)
Basophils Relative: 1 %
Eosinophils Absolute: 0.5 10*3/uL (ref 0.0–0.5)
Eosinophils Relative: 7 %
HCT: 44.3 % (ref 39.0–52.0)
Hemoglobin: 14.8 g/dL (ref 13.0–17.0)
Immature Granulocytes: 0 %
Lymphocytes Relative: 26 %
Lymphs Abs: 1.9 10*3/uL (ref 0.7–4.0)
MCH: 30.2 pg (ref 26.0–34.0)
MCHC: 33.4 g/dL (ref 30.0–36.0)
MCV: 90.4 fL (ref 80.0–100.0)
Monocytes Absolute: 0.9 10*3/uL (ref 0.1–1.0)
Monocytes Relative: 12 %
Neutro Abs: 4 10*3/uL (ref 1.7–7.7)
Neutrophils Relative %: 54 %
Platelet Count: 46 10*3/uL — ABNORMAL LOW (ref 150–400)
RBC: 4.9 MIL/uL (ref 4.22–5.81)
RDW: 14.2 % (ref 11.5–15.5)
WBC Count: 7.4 10*3/uL (ref 4.0–10.5)
nRBC: 0 % (ref 0.0–0.2)

## 2023-03-27 NOTE — Progress Notes (Signed)
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 male who has been referred to [place] by Dr Bradley Ferris MD for evaluation and management of thrombocytopenia.   Patient has a history of hypertension, dyslipidemia, coronary disease status post MI with 2 stents placed more than 5 years ago, remote history of gastric ulcer.   Patient presented to the emergency room with right lower extremity pain and warmth redness and increasing discomfort.  He notes that he had had 2 traumas to the same area on his right leg after which he started having leg pain and swelling.  He was started on doxycycline as outpatient and noted that this failed to resolve his leg pain which was worsening and therefore he came to the emergency room.   In the emergency room the patient was noted to have severe right lower extremity cellulitis and was started on IV ceftriaxone and vancomycin. He was also noted on labs to have severe thrombocytopenia with platelets less than 5k  INTERVAL HISTORY:   Joel Hoffman is a 62 y.o. 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   .I connected with Joel Hoffman on 03/27/2023 at  8:40 AM EST by telephone visit and verified that I am speaking with the correct person using two identifiers.   Patient was last seen by me on 03/05/2023 and he was doing well overall.   Patient notes he has been doing well overall since our last visit. He regularly takes Promacta as prescribed and he has been tolerating it well without any new or severe toxicities.   He denies any new infection issues, fever, chills, night sweats, unexpected weight  loss, back pain, chest pain, abdominal pain, or leg swelling.   Discussed lab results from today, 03/27/2023, and labs from 03/19/2023.  I discussed the limitations, risks, security and privacy concerns of performing an evaluation and management service by telemedicine and the availability of in-person appointments. I also discussed with the patient that there may be a patient responsible charge related to this service. The patient expressed understanding and agreed to proceed.   Other persons participating in the visit and their role in the encounter: None   Patient's location: Home  Provider's location: [facility]   Chief Complaint: Acute ITP     MEDICAL HISTORY:  Past Medical History:  Diagnosis Date   Coronary artery disease    Gastric ulcer    Heart attack (HCC)    Hypercholesteremia    Hypertension     SURGICAL HISTORY: Past Surgical History:  Procedure Laterality Date   CORONARY ANGIOPLASTY WITH STENT PLACEMENT     TONSILLECTOMY      SOCIAL HISTORY: Social History   Socioeconomic History   Marital status: Single    Spouse name: Not on file   Number of children: Not on file   Years of education: Not on file   Highest education level: Not on file  Occupational History   Occupation: Ecologist: Production assistant, radio FOR SELF EMPLOYED    Comment: Retired from [employer]  Tobacco Use   Smoking status: Never   Smokeless tobacco: Never  Vaping Use   Vaping status: Never Used  Substance and Sexual  Activity   Alcohol use: No    Comment: "Quit 24 years ago"   Drug use: No   Sexual activity: Not Currently  Other Topics Concern   Not on file  Social History Narrative   Not on file   Social Determinants of Health   Financial Resource Strain: Not on file  Food Insecurity: No Food Insecurity (08/21/2022)   Hunger Vital Sign    Worried About Running Out of Food in the Last Year: Never true    Ran Out of Food in the Last Year: Never true  Transportation Needs: No  Transportation Needs (08/21/2022)   PRAPARE - Administrator, Civil Service (Medical): No    Lack of Transportation (Non-Medical): No  Physical Activity: Not on file  Stress: Not on file  Social Connections: Not on file  Intimate Partner Violence: Not At Risk (08/21/2022)   Humiliation, Afraid, Rape, and Kick questionnaire    Fear of Current or Ex-Partner: No    Emotionally Abused: No    Physically Abused: No    Sexually Abused: No    FAMILY HISTORY: Family History  Problem Relation Age of Onset   Dementia Mother    Cancer Father    Diabetes Father    Emphysema Father    Colon cancer Father    Hypertension Brother     ALLERGIES:  is allergic to ampicillin.  MEDICATIONS:  Current Outpatient Medications  Medication Sig Dispense Refill   acetaminophen (TYLENOL) 500 MG tablet Take 500 mg by mouth 2 (two) times daily as needed for mild pain or headache.     atorvastatin (LIPITOR) 80 MG tablet Take 80 mg by mouth every evening.     carvedilol (COREG) 12.5 MG tablet Take 12.5 mg by mouth 2 (two) times daily.     doxycycline (VIBRA-TABS) 100 MG tablet Take 1 tablet (100 mg total) by mouth 2 (two) times daily. 20 tablet 0   eltrombopag (PROMACTA) 25 MG tablet Take 1 tablet (25 mg total) by mouth daily. Take on an empty stomach, 1 hour before a meal or 2 hours after. 30 tablet 2   Emollient (CETAPHIL) cream Apply 1 Application topically daily as needed (for dryness- affected areas).     lisinopril (ZESTRIL) 10 MG tablet Take 10 mg by mouth daily.     Multiple Vitamins-Minerals (CENTRUM SILVER 50+MEN PO) Take 1 tablet by mouth daily with breakfast.     ondansetron (ZOFRAN) 8 MG tablet Take 1 tablet (8 mg total) by mouth every 8 (eight) hours as needed for nausea or vomiting. 20 tablet 0   silver sulfADIAZINE (SILVADENE) 1 % cream Apply topically daily. 50 g 2   No current facility-administered medications for this visit.    REVIEW OF SYSTEMS:  TELE-MED VISIT  LABORATORY  DATA:  I have reviewed the data as listed .    Latest Ref Rng & Units 03/19/2023    8:22 AM 03/05/2023   11:36 AM 02/23/2023    8:12 AM  CBC  WBC 4.0 - 10.5 K/uL 6.8  8.1  7.2   Hemoglobin 13.0 - 17.0 g/dL 93.8  18.2  99.3   Hematocrit 39.0 - 52.0 % 44.4  42.3  44.6   Platelets 150 - 400 K/uL 28  384  82    .    Latest Ref Rng & Units 03/19/2023    8:22 AM 03/05/2023   11:36 AM 02/23/2023    8:12 AM  CMP  Glucose 70 - 99 mg/dL 85  89  86   BUN 8 - 23 mg/dL 16  20  16    Creatinine 0.61 - 1.24 mg/dL 8.29  5.62  1.30   Sodium 135 - 145 mmol/L 142  141  143   Potassium 3.5 - 5.1 mmol/L 4.0  4.4  5.2   Chloride 98 - 111 mmol/L 107  109  108   CO2 22 - 32 mmol/L 30  30  33   Calcium 8.9 - 10.3 mg/dL 9.2  9.0  9.7   Total Protein 6.5 - 8.1 g/dL 6.3  6.2  6.5   Total Bilirubin <1.2 mg/dL 0.8  0.7  0.7   Alkaline Phos 38 - 126 U/L 76  75  83   AST 15 - 41 U/L 30  27  29    ALT 0 - 44 U/L 26  22  23     Immature platelet fraction 6.9$   RADIOGRAPHIC STUDIES: I have personally reviewed the radiological images as listed and agreed with the findings in the report. No results found.  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 Immature platelet fraction of 37.2% suggesting excellent bone marrow response. Use of high doses of ibuprofen over the last 5 days could also be a concern. Normal TSH B12 and folate levels Monospot test negative All of the patients questions were answered with apparent satisfaction. The patient knows to call the clinic with any problems, questions or concerns. CT abdomen pelvis done today shows no hepatosplenomegaly or signs of chronic liver disease.   #2 right lower extremity cellulitis following injury to the right leg with skin breach.-On ceftriaxone and  vancomycin per hospitalist.  #3 hypertension #4 coronary disease status post remote history of PCI x 2 #5 obesity  PLAN: -Discussed lab results from today, 03/27/2023, in detail with the patient. CBC shows low but improved platelets of 46 K.  -Discussed lab results from 03/19/2023 in detail with the patient. CBC showed low platelets of 28 K. CMP was stable overall. Immature platelet fraction elevated at 17.1%. -Continue Promacta 25 mg as prescribed.  -Patient has been tolerating Promacta well without any new or severe toxicities.  -Discussed with the patient that we will check lab in 3 weeks and then make changes to his Promacta dosage.  -answered all of patient's questions in detail  FOLLOW-UP: Phone visit with Dr Candise Che in about 3 weeks Labs 1 day prior to phone visit   The total time spent in the appointment was 20 minutes* .  All of the patient's questions were answered with apparent satisfaction. The patient knows to call the clinic with any problems, questions or concerns.   Wyvonnia Lora MD MS AAHIVMS [facility] Hematology/Oncology Physician [facility]  .*Total Encounter Time as defined by the Centers for Medicare and Medicaid Services includes, in addition to the face-to-face time of a patient visit (documented in the note above) non-face-to-face time: obtaining and reviewing outside history, ordering and reviewing medications, tests or procedures, care coordination (communications with other health care professionals or caregivers) and documentation in the medical record.   I,Param Shah,acting as a Neurosurgeon for Wyvonnia Lora, MD.,have documented all relevant documentation on the behalf of Wyvonnia Lora, MD,as directed by  Wyvonnia Lora, MD while in the presence of Wyvonnia Lora, MD.  .I have reviewed the above  documentation for accuracy and completeness, and I agree with the above. [name] MD

## 2023-04-02 ENCOUNTER — Encounter: Payer: Self-pay | Admitting: Hematology

## 2023-04-10 ENCOUNTER — Other Ambulatory Visit: Payer: Self-pay

## 2023-04-13 ENCOUNTER — Other Ambulatory Visit: Payer: Self-pay

## 2023-04-16 ENCOUNTER — Other Ambulatory Visit: Payer: Self-pay

## 2023-04-16 DIAGNOSIS — D693 Immune thrombocytopenic purpura: Secondary | ICD-10-CM

## 2023-04-17 ENCOUNTER — Inpatient Hospital Stay: Attending: Hematology

## 2023-04-17 DIAGNOSIS — Z79899 Other long term (current) drug therapy: Secondary | ICD-10-CM | POA: Insufficient documentation

## 2023-04-17 DIAGNOSIS — D693 Immune thrombocytopenic purpura: Secondary | ICD-10-CM | POA: Diagnosis present

## 2023-04-17 LAB — CBC WITH DIFFERENTIAL (CANCER CENTER ONLY)
Abs Immature Granulocytes: 0.02 10*3/uL (ref 0.00–0.07)
Basophils Absolute: 0.1 10*3/uL (ref 0.0–0.1)
Basophils Relative: 1 %
Eosinophils Absolute: 0.4 10*3/uL (ref 0.0–0.5)
Eosinophils Relative: 6 %
HCT: 44.1 % (ref 39.0–52.0)
Hemoglobin: 14.9 g/dL (ref 13.0–17.0)
Immature Granulocytes: 0 %
Lymphocytes Relative: 26 %
Lymphs Abs: 1.8 10*3/uL (ref 0.7–4.0)
MCH: 30.3 pg (ref 26.0–34.0)
MCHC: 33.8 g/dL (ref 30.0–36.0)
MCV: 89.6 fL (ref 80.0–100.0)
Monocytes Absolute: 0.9 10*3/uL (ref 0.1–1.0)
Monocytes Relative: 13 %
Neutro Abs: 3.6 10*3/uL (ref 1.7–7.7)
Neutrophils Relative %: 54 %
Platelet Count: 51 10*3/uL — ABNORMAL LOW (ref 150–400)
RBC: 4.92 MIL/uL (ref 4.22–5.81)
RDW: 13.6 % (ref 11.5–15.5)
WBC Count: 6.8 10*3/uL (ref 4.0–10.5)
nRBC: 0 % (ref 0.0–0.2)

## 2023-04-17 LAB — IMMATURE PLATELET FRACTION: Immature Platelet Fraction: 19.4 % — ABNORMAL HIGH (ref 1.2–8.6)

## 2023-04-18 ENCOUNTER — Inpatient Hospital Stay (HOSPITAL_BASED_OUTPATIENT_CLINIC_OR_DEPARTMENT_OTHER): Admitting: Hematology

## 2023-04-18 ENCOUNTER — Other Ambulatory Visit: Payer: Self-pay

## 2023-04-18 ENCOUNTER — Encounter: Payer: Self-pay | Admitting: Hematology

## 2023-04-18 ENCOUNTER — Other Ambulatory Visit (HOSPITAL_COMMUNITY): Payer: Self-pay

## 2023-04-18 DIAGNOSIS — D693 Immune thrombocytopenic purpura: Secondary | ICD-10-CM

## 2023-04-18 MED ORDER — ELTROMBOPAG OLAMINE 25 MG PO TABS: 25.0000 mg | ORAL_TABLET | Freq: Every day | ORAL | 11 refills | Status: DC

## 2023-04-18 MED ORDER — ELTROMBOPAG OLAMINE 25 MG PO TABS
25.0000 mg | ORAL_TABLET | Freq: Every day | ORAL | 11 refills | Status: DC
  Filled 2023-04-18 (×2): qty 30, 30d supply, fill #0
  Filled 2023-05-08: qty 30, 30d supply, fill #1
  Filled 2023-06-05: qty 30, 30d supply, fill #2

## 2023-04-18 NOTE — Progress Notes (Signed)
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 male who has been referred to [place] by Dr Bradley Ferris MD for evaluation and management of thrombocytopenia.   Patient has a history of hypertension, dyslipidemia, coronary disease status post MI with 2 stents placed more than 5 years ago, remote history of gastric ulcer.   Patient presented to the emergency room with right lower extremity pain and warmth redness and increasing discomfort.  He notes that he had had 2 traumas to the same area on his right leg after which he started having leg pain and swelling.  He was started on doxycycline as outpatient and noted that this failed to resolve his leg pain which was worsening and therefore he came to the emergency room.   In the emergency room the patient was noted to have severe right lower extremity cellulitis and was started on IV ceftriaxone and vancomycin. He was also noted on labs to have severe thrombocytopenia with platelets less than 5k  INTERVAL HISTORY:   Joel Hoffman is a 62 y.o. 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   I last connected with patient via telemedicine visit on 03/27/2023 and was doing well overall.   I connected with Joel Hoffman on 04/18/23 at  8:40 AM EST by telephone visit and verified that I am speaking with the correct person using two identifiers.   I discussed the limitations, risks, security and privacy concerns of performing an evaluation and management service by telemedicine and the availability of in-person appointments. I also discussed with the patient that there may be a patient  responsible charge related to this service. The patient expressed understanding and agreed to proceed.   Other persons participating in the visit and their role in the encounter: none   Patient's location: home  Provider's location: [facility]   Chief Complaint: Acute ITP     Today, he reports no new symptoms since his last visit. 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 He has been tolerating Promacta 25 MG daily well with no new or severe toxicity issues.   MEDICAL HISTORY:  Past Medical History:  Diagnosis Date   Coronary artery disease    Gastric ulcer    Heart attack (HCC)    Hypercholesteremia    Hypertension     SURGICAL HISTORY: Past Surgical History:  Procedure Laterality Date   CORONARY ANGIOPLASTY WITH STENT PLACEMENT     TONSILLECTOMY      SOCIAL HISTORY: Social History   Socioeconomic History   Marital status: Single    Spouse name: Not on file   Number of children: Not on file   Years of education: Not on file   Highest education level: Not on file  Occupational History   Occupation: Ecologist: Production assistant, radio FOR SELF EMPLOYED    Comment: Retired from [employer]  Tobacco Use   Smoking status: Never   Smokeless tobacco: Never  Vaping Use   Vaping status: Never Used  Substance and Sexual Activity   Alcohol use: No    Comment: "Quit 24 years ago"   Drug use: No   Sexual activity: Not Currently  Other Topics Concern  Not on file  Social History Narrative   Not on file   Social Drivers of Health   Financial Resource Strain: Not on file  Food Insecurity: No Food Insecurity (08/21/2022)   Hunger Vital Sign    Worried About Running Out of Food in the Last Year: Never true    Ran Out of Food in the Last Year: Never true  Transportation Needs: No Transportation Needs (08/21/2022)   PRAPARE - Administrator, Civil Service (Medical): No    Lack of Transportation (Non-Medical): No  Physical Activity: Not on file  Stress:  Not on file  Social Connections: Not on file  Intimate Partner Violence: Not At Risk (08/21/2022)   Humiliation, Afraid, Rape, and Kick questionnaire    Fear of Current or Ex-Partner: No    Emotionally Abused: No    Physically Abused: No    Sexually Abused: No    FAMILY HISTORY: Family History  Problem Relation Age of Onset   Dementia Mother    Cancer Father    Diabetes Father    Emphysema Father    Colon cancer Father    Hypertension Brother     ALLERGIES:  is allergic to ampicillin.  MEDICATIONS:  Current Outpatient Medications  Medication Sig Dispense Refill   acetaminophen (TYLENOL) 500 MG tablet Take 500 mg by mouth 2 (two) times daily as needed for mild pain or headache.     atorvastatin (LIPITOR) 80 MG tablet Take 80 mg by mouth every evening.     carvedilol (COREG) 12.5 MG tablet Take 12.5 mg by mouth 2 (two) times daily.     doxycycline (VIBRA-TABS) 100 MG tablet Take 1 tablet (100 mg total) by mouth 2 (two) times daily. 20 tablet 0   eltrombopag (PROMACTA) 25 MG tablet Take 1 tablet (25 mg total) by mouth daily. Take on an empty stomach, 1 hour before a meal or 2 hours after. 30 tablet 2   Emollient (CETAPHIL) cream Apply 1 Application topically daily as needed (for dryness- affected areas).     lisinopril (ZESTRIL) 10 MG tablet Take 10 mg by mouth daily.     Multiple Vitamins-Minerals (CENTRUM SILVER 50+MEN PO) Take 1 tablet by mouth daily with breakfast.     ondansetron (ZOFRAN) 8 MG tablet Take 1 tablet (8 mg total) by mouth every 8 (eight) hours as needed for nausea or vomiting. 20 tablet 0   silver sulfADIAZINE (SILVADENE) 1 % cream Apply topically daily. 50 g 2   No current facility-administered medications for this visit.    REVIEW OF SYSTEMS:    10 Point review of Systems was done is negative except as noted above.   PHYSICAL EXAMINATION: TELEMEDICINE VISIT  LABORATORY DATA:  I have reviewed the data as listed .    Latest Ref Rng & Units 04/17/2023     1:08 PM 03/27/2023   12:55 PM 03/19/2023    8:22 AM  CBC  WBC 4.0 - 10.5 K/uL 6.8  7.4  6.8   Hemoglobin 13.0 - 17.0 g/dL 21.3  08.6  57.8   Hematocrit 39.0 - 52.0 % 44.1  44.3  44.4   Platelets 150 - 400 K/uL 51  46  28    .    Latest Ref Rng & Units 03/19/2023    8:22 AM 03/05/2023   11:36 AM 02/23/2023    8:12 AM  CMP  Glucose 70 - 99 mg/dL 85  89  86   BUN 8 - 23 mg/dL 16  20  16   Creatinine 0.61 - 1.24 mg/dL 1.61  0.96  0.45   Sodium 135 - 145 mmol/L 142  141  143   Potassium 3.5 - 5.1 mmol/L 4.0  4.4  5.2   Chloride 98 - 111 mmol/L 107  109  108   CO2 22 - 32 mmol/L 30  30  33   Calcium 8.9 - 10.3 mg/dL 9.2  9.0  9.7   Total Protein 6.5 - 8.1 g/dL 6.3  6.2  6.5   Total Bilirubin <1.2 mg/dL 0.8  0.7  0.7   Alkaline Phos 38 - 126 U/L 76  75  83   AST 15 - 41 U/L 30  27  29    ALT 0 - 44 U/L 26  22  23     Immature platelet fraction 6.9$   RADIOGRAPHIC STUDIES: I have personally reviewed the radiological images as listed and agreed with the findings in the report. No results found.  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 Immature platelet fraction of 37.2% suggesting excellent bone marrow response. Use of high doses of ibuprofen over the last 5 days could also be a concern. Normal TSH B12 and folate levels Monospot test negative All of the patients questions were answered with apparent satisfaction. The patient knows to call the clinic with any problems, questions or concerns. CT abdomen pelvis done today shows no hepatosplenomegaly or signs of chronic liver disease.   #2 right lower extremity cellulitis following injury to the right leg with skin breach.-On ceftriaxone and vancomycin per hospitalist.  #3 hypertension #4 coronary disease status post remote  history of PCI x 2 #5 obesity  PLAN:  -Discussed lab results from 04/17/2023 in detail with patient. CBC showed WBC of 6.8K, hemoglobin of 14.9, and platelets of 51K. -platelets remain stable overall and have improved from 46K on 03/27/2023 to 51K currently -his other blood counts are normal. He has no anemia and his WBCs are stable -patient has been tolerating Promacta well without any new or severe toxicities.  -Continue current dose of Promacte at 25 MG p.o. daily -discussed platelet goal of at least 30K, and preferably 50K or more -Recommend taking vitamin B complex supplements -patient shall return with labs in 2 months  FOLLOW-UP: RTC with Dr Candise Che with labs in 2 months  The total time spent in the appointment was 20 minutes* .  All of the patient's questions were answered with apparent satisfaction. The patient knows to call the clinic with any problems, questions or concerns.   Wyvonnia Lora MD MS AAHIVMS [facility] Hematology/Oncology Physician [facility]  .*Total Encounter Time as defined by the Centers for Medicare and Medicaid Services includes, in addition to the face-to-face time of a patient visit (documented in the note above) non-face-to-face time: obtaining and reviewing outside history, ordering and reviewing medications, tests or procedures, care coordination (communications with other health care professionals or caregivers) and documentation in the medical record.    I,Mitra Faeizi,acting as a Neurosurgeon for Wyvonnia Lora, MD.,have documented all relevant documentation on the behalf of Wyvonnia Lora, MD,as directed by  Wyvonnia Lora, MD while in the presence of Wyvonnia Lora, MD.  .I have reviewed the above documentation for accuracy and completeness, and I agree with the above. [name] MD

## 2023-04-18 NOTE — Progress Notes (Signed)
Specialty Pharmacy Refill Coordination Note  Joel Hoffman is a 62 y.o. male contacted today regarding refills of specialty medication(s) Eltrombopag [name] [facility])   Patient requested Delivery   Delivery date: 04/20/23   Verified address: [address]   Medication will be filled on 04/19/23.

## 2023-04-19 ENCOUNTER — Other Ambulatory Visit (HOSPITAL_COMMUNITY): Payer: Self-pay

## 2023-04-19 ENCOUNTER — Other Ambulatory Visit: Payer: Self-pay

## 2023-04-19 NOTE — Progress Notes (Signed)
Patient has had a change in insurance and now only has T[employer] coverage. Will AR $43.00 balance to prevent delays. Please advise patient of new copay and collect payment during next patient encounter.

## 2023-04-24 ENCOUNTER — Encounter: Payer: Self-pay | Admitting: Hematology

## 2023-04-30 ENCOUNTER — Telehealth: Payer: Self-pay | Admitting: Hematology

## 2023-04-30 NOTE — Telephone Encounter (Signed)
Left patient a voicemail regarding scheduled appointment times/dates; left callback number if needed for rescheduling

## 2023-05-08 ENCOUNTER — Other Ambulatory Visit (HOSPITAL_COMMUNITY): Payer: Self-pay

## 2023-05-08 NOTE — Progress Notes (Signed)
 Specialty Pharmacy Refill Coordination Note  Joel Hoffman is a 63 y.o. male contacted today regarding refills of specialty medication(s) Eltrombopag  [name] (PROMACTA )   Patient requested Delivery   Delivery date: 05/15/23   Verified address: [address] [place] [address]   Medication will be filled on 05/14/23.

## 2023-05-14 ENCOUNTER — Telehealth: Payer: Self-pay | Admitting: Pharmacy Technician

## 2023-05-14 ENCOUNTER — Other Ambulatory Visit: Payer: Self-pay

## 2023-05-14 ENCOUNTER — Encounter: Payer: Self-pay | Admitting: Hematology

## 2023-05-14 ENCOUNTER — Other Ambulatory Visit (HOSPITAL_COMMUNITY): Payer: Self-pay

## 2023-05-14 NOTE — Telephone Encounter (Signed)
 Oral Oncology Patient Advocate Encounter   Was successful in obtaining a copay card for Promacta .  This copay card will make the patients copay $0.  The annual max benefit on this card is $15,000. After $15,000 is reached patient will be responsible for any remaining cost.  The billing information is as follows and has been shared with [facility].   RxBin: [id number] PCN: [id number] Member ID: [id number] Group ID: [id number]   Estefana Moellers, CPhT-Adv Oncology Pharmacy Patient Advocate [facility] Direct Number: [phone]  Fax: [phone]

## 2023-05-18 ENCOUNTER — Other Ambulatory Visit: Payer: Self-pay

## 2023-05-18 ENCOUNTER — Encounter: Payer: Self-pay | Admitting: Hematology

## 2023-05-18 ENCOUNTER — Emergency Department (HOSPITAL_BASED_OUTPATIENT_CLINIC_OR_DEPARTMENT_OTHER)
Admission: EM | Admit: 2023-05-18 | Discharge: 2023-05-18 | Disposition: A | Discharge status: Home / Self Care | Attending: Emergency Medicine | Admitting: Emergency Medicine

## 2023-05-18 ENCOUNTER — Encounter (HOSPITAL_BASED_OUTPATIENT_CLINIC_OR_DEPARTMENT_OTHER): Payer: Self-pay | Admitting: Emergency Medicine

## 2023-05-18 ENCOUNTER — Emergency Department (HOSPITAL_BASED_OUTPATIENT_CLINIC_OR_DEPARTMENT_OTHER)

## 2023-05-18 DIAGNOSIS — S8991XA Unspecified injury of right lower leg, initial encounter: Secondary | ICD-10-CM | POA: Diagnosis present

## 2023-05-18 DIAGNOSIS — S80211A Abrasion, right knee, initial encounter: Secondary | ICD-10-CM | POA: Diagnosis not present

## 2023-05-18 DIAGNOSIS — W000XXA Fall on same level due to ice and snow, initial encounter: Secondary | ICD-10-CM | POA: Insufficient documentation

## 2023-05-18 DIAGNOSIS — Z23 Encounter for immunization: Secondary | ICD-10-CM | POA: Diagnosis not present

## 2023-05-18 DIAGNOSIS — W19XXXA Unspecified fall, initial encounter: Secondary | ICD-10-CM

## 2023-05-18 MED ORDER — BACITRACIN ZINC 500 UNIT/GM EX OINT: TOPICAL_OINTMENT | Freq: Two times a day (BID) | CUTANEOUS | Status: DC

## 2023-05-18 MED ORDER — TETANUS-DIPHTH-ACELL PERTUSSIS 5-2.5-18.5 LF-MCG/0.5 IM SUSY
0.5000 mL | PREFILLED_SYRINGE | Freq: Once | INTRAMUSCULAR | Status: AC
  Administered 2023-05-18: 0.5 mL via INTRAMUSCULAR
  Filled 2023-05-18: qty 0.5

## 2023-05-18 NOTE — Discharge Instructions (Addendum)
You were evaluated in the emergency room for a right knee abrasion.  An x-ray was taken which did not show any acute abnormality.  Your wound was cleansed and placed in sterile dressing.  Please keep this wound clean and dry.  You can use soapy water to clean this.  If you experience any new or worsening symptoms including fevers, chills, redness, swelling or pain to the knee joint please return to the emergency room.

## 2023-05-18 NOTE — ED Notes (Signed)
Wound to right knee cleaned with sterile water. Antibiotic ointment applied and sterile dressing applied.

## 2023-05-18 NOTE — ED Provider Notes (Signed)
[facility] Provider Note   CSN: [id number] Arrival date & time: 05/18/23  1517     History  Chief Complaint  Patient presents with   Joel Hoffman    [name] is a 63 y.o. male who presents with right knee abrasion following mechanical fall.  Patient states he slipped on ice and landed on his right knee.  He did not hit his head or lose consciousness.  He is not on blood thinners.  He denies significant pain to the extremity.  He is able to ambulate without difficulty no numbness or tingling in the lower extremity.  Unsure of tetanus status.   Fall       Home Medications Prior to Admission medications   Medication Sig Start Date End Date Taking? Authorizing Provider  acetaminophen (TYLENOL) 500 MG tablet Take 500 mg by mouth 2 (two) times daily as needed for mild pain or headache.    [provider]  atorvastatin (LIPITOR) 80 MG tablet Take 80 mg by mouth every evening. 06/05/22   [provider]  carvedilol (COREG) 12.5 MG tablet Take 12.5 mg by mouth 2 (two) times daily. 07/18/21   [provider]  doxycycline (VIBRA-TABS) 100 MG tablet Take 1 tablet (100 mg total) by mouth 2 (two) times daily. 12/22/22   [name], MD  eltrombopag (PROMACTA) 25 MG tablet Take 1 tablet (25 mg total) by mouth daily. Take on an empty stomach, 1 hour before a meal or 2 hours after. 04/18/23   [name], MD  Emollient (CETAPHIL) cream Apply 1 Application topically daily as needed (for dryness- affected areas).    [provider]  lisinopril (ZESTRIL) 10 MG tablet Take 10 mg by mouth daily. 05/30/22   [provider]  Multiple Vitamins-Minerals (CENTRUM SILVER 50+MEN PO) Take 1 tablet by mouth daily with breakfast.    [provider]  ondansetron (ZOFRAN) 8 MG tablet Take 1 tablet (8 mg total) by mouth every 8 (eight) hours as needed for nausea or vomiting. 12/08/22   [name], MD   silver sulfADIAZINE (SILVADENE) 1 % cream Apply topically daily. 02/16/23   [name], MD      Allergies    Ampicillin    Review of Systems   Review of Systems  Musculoskeletal:  Positive for arthralgias and myalgias.    Physical Exam Updated Vital Signs BP (!) 174/97 (BP Location: Left Arm)   Pulse 70   Temp 98.3 F (36.8 C)   Resp 18   Ht 6' (1.829 m)   Wt 129.3 kg   SpO2 97%   BMI 38.65 kg/m  Physical Exam Vitals and nursing note reviewed.  Constitutional:      General: He is not in acute distress.    Appearance: He is well-developed.  HENT:     Head: Normocephalic and atraumatic.  Eyes:     Conjunctiva/sclera: Conjunctivae normal.  Cardiovascular:     Rate and Rhythm: Normal rate and regular rhythm.     Heart sounds: No murmur heard. Pulmonary:     Effort: Pulmonary effort is normal. No respiratory distress.     Breath sounds: Normal breath sounds.  Musculoskeletal:        General: No swelling.     Cervical back: Neck supple.     Comments: Abrasion to right knee, with mild overlying tenderness, no significant ecchymosis, no joint line tenderness or gross deformities, knee stable to varus valgus, 2+ pitting  edema at baseline  Skin:    General: Skin is warm and dry.     Capillary Refill: Capillary refill takes less than 2 seconds.  Neurological:     Mental Status: He is alert.  Psychiatric:        Mood and Affect: Mood normal.     ED Results / Procedures / Treatments   Labs (all labs ordered are listed, but only abnormal results are displayed) Labs Reviewed - No data to display  EKG None  Radiology DG Knee Complete 4 Views Right Result Date: 05/18/2023 CLINICAL DATA:  Fall with abrasions EXAM: RIGHT KNEE - COMPLETE 4+ VIEW COMPARISON:  08/20/2022 FINDINGS: No definitive fracture or malalignment. Tricompartment arthritis of the knee with moderate severe medial joint space degenerative change and moderate patellofemoral degenerative change.  Moderate knee effusion. Vascular calcifications IMPRESSION: No definitive acute osseous abnormality. Tricompartment arthritis with moderate knee effusion. Electronically Signed   By: Jasmine Pang M.D.   On: 05/18/2023 16:39    Procedures Procedures    Medications Ordered in ED Medications  bacitracin ointment (has no administration in time range)  Tdap (BOOSTRIX) injection 0.5 mL (0.5 mLs Intramuscular Given 05/18/23 1654)    ED Course/ Medical Decision Making/ A&P                                 Medical Decision Making Amount and/or Complexity of Data Reviewed Radiology: ordered.   This patient presents to the ED with chief complaint(s) of Right knee abrasion .  The complaint involves an extensive differential diagnosis and also carries with it a high risk of complications and morbidity.   pertinent past medical history as listed in HPI  The differential diagnosis includes  Fracture, sprain, dislocation, cellulitis The initial plan is to  Will obtain plain films of right knee Additional history obtained:  Records reviewed previous admission documents and Care Everywhere/External Records  Initial Assessment:   Hemodynamically stable patient presenting with right knee abrasion following mechanical fall, no evidence of gross deformity or significant tenderness, he is able to ambulate without difficulty, denies significant pain overall low suspicion for fracture.  Patient just wanted to make sure it is not infected, this happened approximately an hour ago, there is no evidence of infection.  Overall no suspicion for one in the acute setting.   Independent ECG interpretation:  none  Independent labs interpretation:  The following labs were independently interpreted:  none  Independent visualization and interpretation of imaging: I independently visualized the following imaging with scope of interpretation limited to determining acute life threatening conditions related to  emergency care: R knee xray, which revealed no acute osseous abnormality, tricompartment arthritic changes, knee effusion  Treatment and Reassessment: No medications administered during visit  Consultations obtained:   none  Disposition:   Patient will be discharged home. The patient has been appropriately medically screened and/or stabilized in the ED. I have low suspicion for any other emergent medical condition which would require further screening, evaluation or treatment in the ED or require inpatient management. At time of discharge the patient is hemodynamically stable and in no acute distress. I have discussed work-up results and diagnosis with patient and answered all questions. Patient is agreeable with discharge plan. We discussed strict return precautions for returning to the emergency department and they verbalized understanding.     Social Determinants of Health:   none  This note was dictated with voice recognition  software.  Despite best efforts at proofreading, errors may have occurred which can change the documentation meaning.          Final Clinical Impression(s) / ED Diagnoses Final diagnoses:  Fall, initial encounter  Abrasion of right knee, initial encounter    Rx / DC Orders ED Discharge Orders     None         Fabienne Bruns 05/18/23 1658    Tegeler, Canary Brim, MD 05/18/23 1924

## 2023-05-18 NOTE — ED Triage Notes (Signed)
Pt reports slipping on black ice and falling on his rt knee, abrasion to knee, bleeding controlled

## 2023-05-30 ENCOUNTER — Other Ambulatory Visit: Payer: Self-pay

## 2023-05-31 ENCOUNTER — Other Ambulatory Visit: Payer: Self-pay

## 2023-06-05 ENCOUNTER — Other Ambulatory Visit: Payer: Self-pay

## 2023-06-05 NOTE — Progress Notes (Signed)
 Specialty Pharmacy Refill Coordination Note  Joel Hoffman is a 63 y.o. male contacted today regarding refills of specialty medication(s) Eltrombopag  [name] (PROMACTA )   Patient requested Delivery   Delivery date: 06/11/23   Verified address: [address] [place] [address]   Medication will be filled on 06/08/23.

## 2023-06-05 NOTE — Progress Notes (Signed)
 Specialty Pharmacy Ongoing Clinical Assessment Note  Joel Hoffman is a 63 y.o. male who is being followed by the specialty pharmacy service for RxSp Oncology   Patient's specialty medication(s) reviewed today: Eltrombopag  [name] (PROMACTA )   Missed doses in the last 4 weeks: 0   Patient/Caregiver did not have any additional questions or concerns.   Therapeutic benefit summary: Unable to assess   Adverse events/side effects summary: No adverse events/side effects   Patient's therapy is appropriate to: Continue    Goals Addressed             This Visit's Progress    Stabilization of disease       Patient is on track. Patient will maintain adherence         Follow up:  6 months  Mitzie GORMAN [facility] Specialty Pharmacist

## 2023-06-08 ENCOUNTER — Other Ambulatory Visit (HOSPITAL_COMMUNITY): Payer: Self-pay

## 2023-06-08 ENCOUNTER — Other Ambulatory Visit: Payer: Self-pay

## 2023-06-15 ENCOUNTER — Other Ambulatory Visit: Payer: Self-pay

## 2023-06-15 DIAGNOSIS — D693 Immune thrombocytopenic purpura: Secondary | ICD-10-CM

## 2023-06-18 ENCOUNTER — Inpatient Hospital Stay: Attending: Hematology

## 2023-06-18 ENCOUNTER — Other Ambulatory Visit: Payer: Self-pay

## 2023-06-18 ENCOUNTER — Inpatient Hospital Stay (HOSPITAL_BASED_OUTPATIENT_CLINIC_OR_DEPARTMENT_OTHER): Admitting: Hematology

## 2023-06-18 ENCOUNTER — Other Ambulatory Visit (HOSPITAL_COMMUNITY): Payer: Self-pay

## 2023-06-18 DIAGNOSIS — Z79899 Other long term (current) drug therapy: Secondary | ICD-10-CM | POA: Insufficient documentation

## 2023-06-18 DIAGNOSIS — E669 Obesity, unspecified: Secondary | ICD-10-CM | POA: Diagnosis not present

## 2023-06-18 DIAGNOSIS — I10 Essential (primary) hypertension: Secondary | ICD-10-CM | POA: Diagnosis not present

## 2023-06-18 DIAGNOSIS — D693 Immune thrombocytopenic purpura: Secondary | ICD-10-CM

## 2023-06-18 LAB — CBC WITH DIFFERENTIAL (CANCER CENTER ONLY)
Abs Immature Granulocytes: 0.02 10*3/uL (ref 0.00–0.07)
Basophils Absolute: 0.1 10*3/uL (ref 0.0–0.1)
Basophils Relative: 1 %
Eosinophils Absolute: 0.4 10*3/uL (ref 0.0–0.5)
Eosinophils Relative: 5 %
HCT: 46 % (ref 39.0–52.0)
Hemoglobin: 15.1 g/dL (ref 13.0–17.0)
Immature Granulocytes: 0 %
Lymphocytes Relative: 20 %
Lymphs Abs: 1.4 10*3/uL (ref 0.7–4.0)
MCH: 29.4 pg (ref 26.0–34.0)
MCHC: 32.8 g/dL (ref 30.0–36.0)
MCV: 89.7 fL (ref 80.0–100.0)
Monocytes Absolute: 0.7 10*3/uL (ref 0.1–1.0)
Monocytes Relative: 9 %
Neutro Abs: 4.5 10*3/uL (ref 1.7–7.7)
Neutrophils Relative %: 65 %
Platelet Count: 34 10*3/uL — ABNORMAL LOW (ref 150–400)
RBC: 5.13 MIL/uL (ref 4.22–5.81)
RDW: 13.1 % (ref 11.5–15.5)
WBC Count: 7 10*3/uL (ref 4.0–10.5)
nRBC: 0 % (ref 0.0–0.2)

## 2023-06-18 LAB — CMP (CANCER CENTER ONLY)
ALT: 21 U/L (ref 0–44)
AST: 26 U/L (ref 15–41)
Albumin: 4 g/dL (ref 3.5–5.0)
Alkaline Phosphatase: 78 U/L (ref 38–126)
Anion gap: 3 — ABNORMAL LOW (ref 5–15)
BUN: 17 mg/dL (ref 8–23)
CO2: 33 mmol/L — ABNORMAL HIGH (ref 22–32)
Calcium: 9.1 mg/dL (ref 8.9–10.3)
Chloride: 107 mmol/L (ref 98–111)
Creatinine: 0.82 mg/dL (ref 0.61–1.24)
GFR, Estimated: >60 mL/min (ref >60)
Glucose, Bld: 86 mg/dL (ref 70–99)
Potassium: 4.5 mmol/L (ref 3.5–5.1)
Sodium: 143 mmol/L (ref 135–145)
Total Bilirubin: 0.8 mg/dL (ref 0.0–1.2)
Total Protein: 6 g/dL — ABNORMAL LOW (ref 6.5–8.1)

## 2023-06-18 LAB — IMMATURE PLATELET FRACTION: Immature Platelet Fraction: 18.6 % — ABNORMAL HIGH (ref 1.2–8.6)

## 2023-06-18 MED ORDER — ELTROMBOPAG OLAMINE 25 MG PO TABS
ORAL_TABLET | ORAL | 11 refills | Status: DC
  Filled 2023-06-18: qty 40, 28d supply, fill #0
  Filled 2023-06-18: qty 45, 30d supply, fill #0
  Filled 2023-06-18: qty 45, fill #0
  Filled 2023-07-02: qty 45, 30d supply, fill #0

## 2023-06-18 NOTE — Progress Notes (Signed)
 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 male who has been referred to [place] by Dr Bradley Ferris MD for evaluation and management of thrombocytopenia.   Patient has a history of hypertension, dyslipidemia, coronary disease status post MI with 2 stents placed more than 5 years ago, remote history of gastric ulcer.   Patient presented to the emergency room with right lower extremity pain and warmth redness and increasing discomfort.  He notes that he had had 2 traumas to the same area on his right leg after which he started having leg pain and swelling.  He was started on doxycycline as outpatient and noted that this failed to resolve his leg pain which was worsening and therefore he came to the emergency room.   In the emergency room the patient was noted to have severe right lower extremity cellulitis and was started on IV ceftriaxone and vancomycin. He was also noted on labs to have severe thrombocytopenia with platelets less than 5k  INTERVAL HISTORY:   Joel Hoffman is a 63 y.o. 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   I last connected with patient via telemedicine visit on 04/18/2023 and was doing well overall.   Patient notes he has been doing well overall since our last visit. He notes he recently fell on black ice around 1 month ago. He hit his right knee during the fall.  He denies any new infection issues, fever, chills, night sweats, unexpected weight loss, back pain, chest pain, or leg swelling.   He has been tolerating his Promacta medication well without any new or severe toxicities.   MEDICAL HISTORY:   Past Medical History:  Diagnosis Date   Coronary artery disease    Gastric ulcer    Heart attack (HCC)    Hypercholesteremia    Hypertension     SURGICAL HISTORY: Past Surgical History:  Procedure Laterality Date   CORONARY ANGIOPLASTY WITH STENT PLACEMENT     TONSILLECTOMY      SOCIAL HISTORY: Social History   Socioeconomic History   Marital status: Single    Spouse name: Not on file   Number of children: Not on file   Years of education: Not on file   Highest education level: Not on file  Occupational History   Occupation: Ecologist: Production assistant, radio FOR SELF EMPLOYED    Comment: Retired from [employer]  Tobacco Use   Smoking status: Never   Smokeless tobacco: Never  Vaping Use   Vaping status: Never Used  Substance and Sexual Activity   Alcohol use: No    Comment: "Quit 24 years ago"   Drug use: No   Sexual activity: Not Currently  Other Topics Concern   Not on file  Social History Narrative   Not on file   Social Drivers of Health   Financial Resource Strain: Not on file  Food Insecurity: No Food Insecurity (08/21/2022)   Hunger Vital Sign    Worried About Running Out of Food in the Last Year: Never true    Ran Out of Food in the Last Year: Never true  Transportation Needs: No Transportation Needs (08/21/2022)   PRAPARE - Transportation  Lack of Transportation (Medical): No    Lack of Transportation (Non-Medical): No  Physical Activity: Not on file  Stress: Not on file  Social Connections: Not on file  Intimate Partner Violence: Not At Risk (08/21/2022)   Humiliation, Afraid, Rape, and Kick questionnaire    Fear of Current or Ex-Partner: No    Emotionally Abused: No    Physically Abused: No    Sexually Abused: No    FAMILY HISTORY: Family History  Problem Relation Age of Onset   Dementia Mother    Cancer Father    Diabetes Father    Emphysema Father    Colon cancer Father    Hypertension Brother     ALLERGIES:  is allergic to  ampicillin.  MEDICATIONS:  Current Outpatient Medications  Medication Sig Dispense Refill   acetaminophen (TYLENOL) 500 MG tablet Take 500 mg by mouth 2 (two) times daily as needed for mild pain or headache.     atorvastatin (LIPITOR) 80 MG tablet Take 80 mg by mouth every evening.     carvedilol (COREG) 12.5 MG tablet Take 12.5 mg by mouth 2 (two) times daily.     doxycycline (VIBRA-TABS) 100 MG tablet Take 1 tablet (100 mg total) by mouth 2 (two) times daily. 20 tablet 0   eltrombopag (PROMACTA) 25 MG tablet Take 1 tablet (25 mg total) by mouth daily. Take on an empty stomach, 1 hour before a meal or 2 hours after. 30 tablet 11   Emollient (CETAPHIL) cream Apply 1 Application topically daily as needed (for dryness- affected areas).     lisinopril (ZESTRIL) 10 MG tablet Take 10 mg by mouth daily.     Multiple Vitamins-Minerals (CENTRUM SILVER 50+MEN PO) Take 1 tablet by mouth daily with breakfast.     ondansetron (ZOFRAN) 8 MG tablet Take 1 tablet (8 mg total) by mouth every 8 (eight) hours as needed for nausea or vomiting. 20 tablet 0   silver sulfADIAZINE (SILVADENE) 1 % cream Apply topically daily. 50 g 2   No current facility-administered medications for this visit.    REVIEW OF SYSTEMS:    10 Point review of Systems was done is negative except as noted above.   PHYSICAL EXAMINATION: TELEMEDICINE VISIT  LABORATORY DATA:  I have reviewed the data as listed .    Latest Ref Rng & Units 06/18/2023   10:25 AM 04/17/2023    1:08 PM 03/27/2023   12:55 PM  CBC  WBC 4.0 - 10.5 K/uL 7.0  6.8  7.4   Hemoglobin 13.0 - 17.0 g/dL 40.9  81.1  91.4   Hematocrit 39.0 - 52.0 % 46.0  44.1  44.3   Platelets 150 - 400 K/uL 34  51  46    .    Latest Ref Rng & Units 06/18/2023   10:25 AM 03/19/2023    8:22 AM 03/05/2023   11:36 AM  CMP  Glucose 70 - 99 mg/dL 86  85  89   BUN 8 - 23 mg/dL 17  16  20    Creatinine 0.61 - 1.24 mg/dL 7.82  9.56  2.13   Sodium 135 - 145 mmol/L 143  142  141    Potassium 3.5 - 5.1 mmol/L 4.5  4.0  4.4   Chloride 98 - 111 mmol/L 107  107  109   CO2 22 - 32 mmol/L 33  30  30   Calcium 8.9 - 10.3 mg/dL 9.1  9.2  9.0   Total Protein 6.5 - 8.1  g/dL 6.0  6.3  6.2   Total Bilirubin 0.0 - 1.2 mg/dL 0.8  0.8  0.7   Alkaline Phos 38 - 126 U/L 78  76  75   AST 15 - 41 U/L 26  30  27    ALT 0 - 44 U/L 21  26  22     Immature platelet fraction 6.9$   RADIOGRAPHIC STUDIES: I have personally reviewed the radiological images as listed and agreed with the findings in the report. No results found.  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 Immature platelet fraction of 37.2% suggesting excellent bone marrow response. Use of high doses of ibuprofen over the last 5 days could also be a concern. Normal TSH B12 and folate levels Monospot test negative All of the patients questions were answered with apparent satisfaction. The patient knows to call the clinic with any problems, questions or concerns. CT abdomen pelvis done today shows no hepatosplenomegaly or signs of chronic liver disease.   #2 right lower extremity cellulitis following injury to the right leg with skin breach.-On ceftriaxone and vancomycin per hospitalist.  #3 hypertension #4 coronary disease status post remote history of PCI x 2 #5 obesity  PLAN:  -Discussed lab results from today, 06/18/2023, in detail with the patient. CBC shows low platelets of 34 K. CMP stable.  -patient has been tolerating Promacta well without any new or severe toxicities.  -Increase Promacta dosage to 50 mg Monday, Wednesday, and Friday. 25 mg rest of the week.  -discussed platelet goal of at least 30K, and preferably 50K or more -Answered all of patient's questions.   FOLLOW-UP: RTC with Dr Candise Che with labs in 2  months  The total time spent in the appointment was 20 minutes* .  All of the patient's questions were answered with apparent satisfaction. The patient knows to call the clinic with any problems, questions or concerns.   Wyvonnia Lora MD MS AAHIVMS [facility] Hematology/Oncology Physician [facility]  .*Total Encounter Time as defined by the Centers for Medicare and Medicaid Services includes, in addition to the face-to-face time of a patient visit (documented in the note above) non-face-to-face time: obtaining and reviewing outside history, ordering and reviewing medications, tests or procedures, care coordination (communications with other health care professionals or caregivers) and documentation in the medical record.    I,Param Shah,acting as a Neurosurgeon for Wyvonnia Lora, MD.,have documented all relevant documentation on the behalf of Wyvonnia Lora, MD,as directed by  Wyvonnia Lora, MD while in the presence of Wyvonnia Lora, MD.  .I have reviewed the above documentation for accuracy and completeness, and I agree with the above. [name] MD

## 2023-06-18 NOTE — Progress Notes (Signed)
Specialty Pharmacy Refill Coordination Note  Joel Hoffman is a 63 y.o. male contacted today regarding refills of specialty medication(s) Eltrombopag [name] [facility])   Patient requested Delivery   Delivery date: 06/28/23   Verified address: [address] [place],  [place] [address]   Medication will be filled on 06/27/23.

## 2023-06-19 ENCOUNTER — Telehealth: Payer: Self-pay | Admitting: Hematology

## 2023-06-19 NOTE — Telephone Encounter (Signed)
Left patient a vm regarding upcoming appt

## 2023-06-24 ENCOUNTER — Encounter: Payer: Self-pay | Admitting: Hematology

## 2023-06-27 ENCOUNTER — Other Ambulatory Visit (HOSPITAL_COMMUNITY): Payer: Self-pay

## 2023-06-27 ENCOUNTER — Other Ambulatory Visit: Payer: Self-pay

## 2023-06-27 NOTE — Progress Notes (Signed)
 Spoke with patient and he has enough medication to last. Aware that claim will bill on 3/3 and deliver on 3/4. Patient requested that courier leave package by front porch door behind [employer]. Note added to [employer].

## 2023-06-27 NOTE — Progress Notes (Signed)
 Promacta Rx is refill too soon until 07/03/23. LVM to see if patient has enough on hand to last until 3/4 or later. Retimed adjudication for this date until patient calls back.

## 2023-07-02 ENCOUNTER — Other Ambulatory Visit: Payer: Self-pay

## 2023-07-02 ENCOUNTER — Other Ambulatory Visit (HOSPITAL_COMMUNITY): Payer: Self-pay

## 2023-07-02 ENCOUNTER — Encounter: Payer: Self-pay | Admitting: Hematology

## 2023-07-02 ENCOUNTER — Telehealth: Payer: Self-pay | Admitting: Pharmacy Technician

## 2023-07-02 DIAGNOSIS — D693 Immune thrombocytopenic purpura: Secondary | ICD-10-CM

## 2023-07-02 MED ORDER — ELTROMBOPAG OLAMINE 25 MG PO TABS: ORAL_TABLET | ORAL | 11 refills | Status: DC

## 2023-07-02 NOTE — Progress Notes (Signed)
 Was informed by patient's insurance plan that they will no longer pay for the medication at [facility]. Rx [facility] as requested by plan.

## 2023-07-02 NOTE — Telephone Encounter (Signed)
 Oral Oncology Patient Advocate Encounter  Cost exceeds maximum has been approved. Per the representative, patient's plan will no longer cover refills at [facility] and rx will need to be redirected to [facility] for processing.   I left a vm with patient with this change and the phone number for the new pharmacy.  Omer Jack, CPhT-Adv Oncology Pharmacy Patient Advocate [facility]  Direct Number: [phone]  Fax: [phone]

## 2023-07-02 NOTE — Telephone Encounter (Signed)
 Oral Oncology Patient Advocate Encounter   Received notification that cost exceeds maximum prior authorization for Promacta is required.   PA submitted on 07/02/23 Submitted via phone [phone] Status is pending     Omer Jack, CPhT-Adv Oncology Pharmacy Patient Advocate [facility]  Direct Number: [phone]  Fax: [phone]

## 2023-07-10 ENCOUNTER — Other Ambulatory Visit: Payer: Self-pay

## 2023-07-10 DIAGNOSIS — D693 Immune thrombocytopenic purpura: Secondary | ICD-10-CM

## 2023-07-10 MED ORDER — ELTROMBOPAG OLAMINE 25 MG PO TABS: ORAL_TABLET | ORAL | 11 refills | Status: DC

## 2023-07-12 ENCOUNTER — Other Ambulatory Visit (HOSPITAL_COMMUNITY): Payer: Self-pay

## 2023-07-31 DEATH — deceased

## 2023-08-21 ENCOUNTER — Other Ambulatory Visit

## 2023-08-21 ENCOUNTER — Ambulatory Visit: Admitting: Hematology

## 2023-11-20 ENCOUNTER — Other Ambulatory Visit (HOSPITAL_COMMUNITY): Payer: Self-pay

## 2024-05-28 ENCOUNTER — Other Ambulatory Visit (HOSPITAL_COMMUNITY): Payer: Self-pay
